# Patient Record
Sex: Female | Born: 1980 | Race: Black or African American | Hispanic: No | Smoking: Never smoker
Health system: Southern US, Community
[De-identification: ages and names within clinical notes are randomized; demographics above are authoritative.]

## PROBLEM LIST (undated history)

## (undated) DIAGNOSIS — E079 Disorder of thyroid, unspecified: Secondary | ICD-10-CM

## (undated) DIAGNOSIS — I10 Essential (primary) hypertension: Secondary | ICD-10-CM

## (undated) HISTORY — DX: Disorder of thyroid, unspecified: E07.9

## (undated) HISTORY — PX: CHOLECYSTECTOMY: SHX55

---

## 2010-05-30 ENCOUNTER — Emergency Department (HOSPITAL_BASED_OUTPATIENT_CLINIC_OR_DEPARTMENT_OTHER): Admission: EM | Admit: 2010-05-30 | Discharge: 2010-05-30 | Payer: Self-pay | Admitting: Emergency Medicine

## 2010-11-07 ENCOUNTER — Emergency Department (HOSPITAL_BASED_OUTPATIENT_CLINIC_OR_DEPARTMENT_OTHER)
Admission: EM | Admit: 2010-11-07 | Discharge: 2010-11-07 | Disposition: A | Discharge status: Home / Self Care | Payer: Self-pay | Attending: Emergency Medicine | Admitting: Emergency Medicine

## 2010-11-07 ENCOUNTER — Emergency Department (INDEPENDENT_AMBULATORY_CARE_PROVIDER_SITE_OTHER): Payer: Self-pay

## 2010-11-07 DIAGNOSIS — E119 Type 2 diabetes mellitus without complications: Secondary | ICD-10-CM | POA: Insufficient documentation

## 2010-11-07 DIAGNOSIS — R05 Cough: Secondary | ICD-10-CM

## 2010-11-07 DIAGNOSIS — R059 Cough, unspecified: Secondary | ICD-10-CM | POA: Insufficient documentation

## 2010-11-07 DIAGNOSIS — R509 Fever, unspecified: Secondary | ICD-10-CM

## 2010-11-07 DIAGNOSIS — J40 Bronchitis, not specified as acute or chronic: Secondary | ICD-10-CM | POA: Insufficient documentation

## 2010-11-07 DIAGNOSIS — R0989 Other specified symptoms and signs involving the circulatory and respiratory systems: Secondary | ICD-10-CM

## 2010-11-07 DIAGNOSIS — I10 Essential (primary) hypertension: Secondary | ICD-10-CM | POA: Insufficient documentation

## 2011-08-01 ENCOUNTER — Emergency Department (HOSPITAL_COMMUNITY): Payer: No Typology Code available for payment source

## 2011-08-01 ENCOUNTER — Encounter: Payer: Self-pay | Admitting: *Deleted

## 2011-08-01 ENCOUNTER — Emergency Department (HOSPITAL_COMMUNITY)
Admission: EM | Admit: 2011-08-01 | Discharge: 2011-08-02 | Disposition: A | Discharge status: Home / Self Care | Payer: No Typology Code available for payment source | Attending: Emergency Medicine | Admitting: Emergency Medicine

## 2011-08-01 DIAGNOSIS — S139XXA Sprain of joints and ligaments of unspecified parts of neck, initial encounter: Secondary | ICD-10-CM | POA: Insufficient documentation

## 2011-08-01 DIAGNOSIS — S161XXA Strain of muscle, fascia and tendon at neck level, initial encounter: Secondary | ICD-10-CM

## 2011-08-01 DIAGNOSIS — M542 Cervicalgia: Secondary | ICD-10-CM | POA: Insufficient documentation

## 2011-08-01 DIAGNOSIS — M25519 Pain in unspecified shoulder: Secondary | ICD-10-CM | POA: Insufficient documentation

## 2011-08-01 DIAGNOSIS — Z79899 Other long term (current) drug therapy: Secondary | ICD-10-CM | POA: Insufficient documentation

## 2011-08-01 DIAGNOSIS — Z9889 Other specified postprocedural states: Secondary | ICD-10-CM | POA: Insufficient documentation

## 2011-08-01 DIAGNOSIS — T148XXA Other injury of unspecified body region, initial encounter: Secondary | ICD-10-CM

## 2011-08-01 DIAGNOSIS — I10 Essential (primary) hypertension: Secondary | ICD-10-CM | POA: Insufficient documentation

## 2011-08-01 DIAGNOSIS — E119 Type 2 diabetes mellitus without complications: Secondary | ICD-10-CM | POA: Insufficient documentation

## 2011-08-01 HISTORY — DX: Essential (primary) hypertension: I10

## 2011-08-01 LAB — GLUCOSE, CAPILLARY: Glucose-Capillary: 373 mg/dL — ABNORMAL HIGH (ref 70–99)

## 2011-08-01 MED ORDER — DIAZEPAM 5 MG PO TABS
5.0000 mg | ORAL_TABLET | Freq: Once | ORAL | Status: AC
  Administered 2011-08-01: 5 mg via ORAL
  Filled 2011-08-01: qty 1

## 2011-08-01 MED ORDER — IBUPROFEN 800 MG PO TABS
800.0000 mg | ORAL_TABLET | Freq: Once | ORAL | Status: AC
  Administered 2011-08-01: 800 mg via ORAL
  Filled 2011-08-01: qty 1

## 2011-08-01 MED ORDER — HYDROCODONE-ACETAMINOPHEN 5-325 MG PO TABS
1.0000 | ORAL_TABLET | Freq: Once | ORAL | Status: AC
  Administered 2011-08-01: 1 via ORAL
  Filled 2011-08-01: qty 1
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
## 2011-08-02 MED ORDER — IBUPROFEN 400 MG PO TABS: 400.0000 mg | ORAL_TABLET | Freq: Four times a day (QID) | ORAL | Status: AC | PRN

## 2011-08-02 MED ORDER — HYDROCODONE-ACETAMINOPHEN 5-325 MG PO TABS: 1.0000 | ORAL_TABLET | ORAL | Status: AC | PRN

## 2011-08-02 MED ORDER — CYCLOBENZAPRINE HCL 10 MG PO TABS: 10.0000 mg | ORAL_TABLET | Freq: Three times a day (TID) | ORAL | Status: AC | PRN
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
## 2011-10-10 ENCOUNTER — Encounter (HOSPITAL_BASED_OUTPATIENT_CLINIC_OR_DEPARTMENT_OTHER): Payer: Self-pay | Admitting: *Deleted

## 2011-10-10 ENCOUNTER — Emergency Department (HOSPITAL_BASED_OUTPATIENT_CLINIC_OR_DEPARTMENT_OTHER)
Admission: EM | Admit: 2011-10-10 | Discharge: 2011-10-10 | Disposition: A | Discharge status: Home / Self Care | Payer: Self-pay | Attending: Emergency Medicine | Admitting: Emergency Medicine

## 2011-10-10 DIAGNOSIS — E119 Type 2 diabetes mellitus without complications: Secondary | ICD-10-CM | POA: Insufficient documentation

## 2011-10-10 DIAGNOSIS — I10 Essential (primary) hypertension: Secondary | ICD-10-CM | POA: Insufficient documentation

## 2011-10-10 DIAGNOSIS — R109 Unspecified abdominal pain: Secondary | ICD-10-CM | POA: Insufficient documentation

## 2011-10-10 LAB — URINE MICROSCOPIC-ADD ON

## 2011-10-10 LAB — COMPREHENSIVE METABOLIC PANEL
ALT: 17 U/L (ref 0–35)
AST: 15 U/L (ref 0–37)
Albumin: 3.8 g/dL (ref 3.5–5.2)
Alkaline Phosphatase: 78 U/L (ref 39–117)
BUN: 10 mg/dL (ref 6–23)
CO2: 26 mEq/L (ref 19–32)
Calcium: 10.3 mg/dL (ref 8.4–10.5)
Chloride: 98 mEq/L (ref 96–112)
Creatinine, Ser: 0.4 mg/dL — ABNORMAL LOW (ref 0.50–1.10)
GFR calc Af Amer: >90 mL/min (ref >90)
GFR calc non Af Amer: >90 mL/min (ref >90)
Glucose, Bld: 309 mg/dL — ABNORMAL HIGH (ref 70–99)
Potassium: 4 mEq/L (ref 3.5–5.1)
Sodium: 135 mEq/L (ref 135–145)
Total Bilirubin: 0.3 mg/dL (ref 0.3–1.2)
Total Protein: 7.8 g/dL (ref 6.0–8.3)

## 2011-10-10 LAB — DIFFERENTIAL
Basophils Absolute: 0 10*3/uL (ref 0.0–0.1)
Basophils Relative: 0 % (ref 0–1)
Eosinophils Absolute: 0.1 10*3/uL (ref 0.0–0.7)
Eosinophils Relative: 1 % (ref 0–5)
Lymphocytes Relative: 31 % (ref 12–46)
Lymphs Abs: 2.6 10*3/uL (ref 0.7–4.0)
Monocytes Absolute: 0.7 10*3/uL (ref 0.1–1.0)
Monocytes Relative: 8 % (ref 3–12)
Neutro Abs: 5.1 10*3/uL (ref 1.7–7.7)
Neutrophils Relative %: 60 % (ref 43–77)

## 2011-10-10 LAB — URINALYSIS, ROUTINE W REFLEX MICROSCOPIC
Bilirubin Urine: NEGATIVE
Glucose, UA: >1000 mg/dL — AB
Hgb urine dipstick: NEGATIVE
Ketones, ur: NEGATIVE mg/dL
Leukocytes, UA: NEGATIVE
Nitrite: NEGATIVE
Protein, ur: NEGATIVE mg/dL
Specific Gravity, Urine: 1.041 — ABNORMAL HIGH (ref 1.005–1.030)
Urobilinogen, UA: 0.2 mg/dL (ref 0.0–1.0)
pH: 5.5 (ref 5.0–8.0)

## 2011-10-10 LAB — CBC
HCT: 39.8 % (ref 36.0–46.0)
Hemoglobin: 14.2 g/dL (ref 12.0–15.0)
MCH: 28.6 pg (ref 26.0–34.0)
MCHC: 35.7 g/dL (ref 30.0–36.0)
MCV: 80.1 fL (ref 78.0–100.0)
Platelets: 235 10*3/uL (ref 150–400)
RBC: 4.97 MIL/uL (ref 3.87–5.11)
RDW: 12.4 % (ref 11.5–15.5)
WBC: 8.5 10*3/uL (ref 4.0–10.5)

## 2011-10-10 LAB — LIPASE, BLOOD: Lipase: 21 U/L (ref 11–59)

## 2011-10-10 LAB — PREGNANCY, URINE: Preg Test, Ur: NEGATIVE

## 2011-10-10 MED ORDER — HYDROMORPHONE HCL PF 1 MG/ML IJ SOLN
1.0000 mg | Freq: Once | INTRAMUSCULAR | Status: AC
  Administered 2011-10-10: 1 mg via INTRAVENOUS
  Filled 2011-10-10: qty 1

## 2011-10-10 MED ORDER — SODIUM CHLORIDE 0.9 % IV BOLUS (SEPSIS)
1000.0000 mL | Freq: Once | INTRAVENOUS | Status: AC
  Administered 2011-10-10: 1000 mL via INTRAVENOUS

## 2011-10-10 MED ORDER — ONDANSETRON HCL 4 MG/2ML IJ SOLN
4.0000 mg | Freq: Once | INTRAMUSCULAR | Status: AC
  Administered 2011-10-10: 4 mg via INTRAVENOUS
  Filled 2011-10-10: qty 2

## 2011-10-10 NOTE — ED Provider Notes (Signed)
History   Scribed for Hilario Quarry, MD, the patient was seen in [facility]. The chart was scribed by Gilman Schmidt. The patients care was started at 9:35 PM.   CSN: [id number]  Arrival date & time 10/10/11  2112   First MD Initiated Contact with Patient 10/10/11 2118      Chief Complaint  Patient presents with  . Abdominal Pain    (Consider location/radiation/quality/duration/timing/severity/associated sxs/prior treatment) HPI Rhonda Baldwin is a 31 y.o. female with a history of HTN and DM who presents to the Emergency Department complaining of sharp "severe" abdominal pain ~8 hours after eating lunch. Pt reports eating chicken nuggets and fries. States symptoms are worsening. Also notes nausea and vomiting (4x). Denies any diarrhea. Denies any previous similar symptoms. Denies any hx of Pancreatitis. Denies any exacerbating or alleviating factors. Pt states she is prescribed meds for DM but does not consistently take them. States she has not seen PCP is last year. There are no other associated symptoms and no other alleviating or aggravating factors.     Past Medical History  Diagnosis Date  . Hypertension   . Diabetes mellitus     Past Surgical History  Procedure Date  . Cholecystectomy   . Cesarean section     Family History  Problem Relation Age of Onset  . Hypertension Mother   . Hypertension Father     History  Substance Use Topics  . Smoking status: Never Smoker   . Smokeless tobacco: Not on file  . Alcohol Use: No    OB History    Grav Para Term Preterm Abortions TAB SAB Ect Mult Living                  Review of Systems  Gastrointestinal: Positive for nausea, vomiting and abdominal pain. Negative for diarrhea.  All other systems reviewed and are negative.    Allergies  Review of patient's allergies indicates no known allergies.  Home Medications   Current Outpatient Rx  Name Route Sig Dispense Refill  . LISINOPRIL 10 MG PO TABS Oral Take 10 mg  by mouth daily.      Marland Kitchen METFORMIN HCL 1000 MG PO TABS Oral Take 1,000 mg by mouth 2 (two) times daily with a meal.        BP 182/110  Pulse 113  Temp 97.9 F (36.6 C)  Resp 18  Ht 5\' 3"  (1.6 m)  Wt 300 lb (136.079 kg)  BMI 53.14 kg/m2  SpO2 100%  LMP 09/29/2011  Physical Exam  Constitutional: She appears well-developed and well-nourished.  HENT:  Head: Normocephalic and atraumatic.  Eyes: Conjunctivae are normal. Pupils are equal, round, and reactive to light.  Neck: Neck supple. No tracheal deviation present. No thyromegaly present.  Cardiovascular: Normal rate and regular rhythm.   No murmur heard. Pulmonary/Chest: Effort normal and breath sounds normal.  Abdominal: Soft. Bowel sounds are normal. She exhibits no distension. There is tenderness in the epigastric area.  Musculoskeletal: Normal range of motion. She exhibits no edema and no tenderness.  Neurological: She is alert. Coordination normal.  Skin: Skin is warm and dry. No rash noted.  Psychiatric: She has a normal mood and affect.    ED Course  Procedures (including critical care time)   DIAGNOSTIC STUDIES: Oxygen Saturation is 100% on room air, normal by my interpretation.    LABS Results for orders placed during the hospital encounter of 10/10/11  URINALYSIS, ROUTINE W REFLEX MICROSCOPIC      Component  Value Range   Color, Urine YELLOW  YELLOW    APPearance CLEAR  CLEAR    Specific Gravity, Urine 1.041 (*) 1.005 - 1.030    pH 5.5  5.0 - 8.0    Glucose, UA >1000 (*) NEGATIVE (mg/dL)   Hgb urine dipstick NEGATIVE  NEGATIVE    Bilirubin Urine NEGATIVE  NEGATIVE    Ketones, ur NEGATIVE  NEGATIVE (mg/dL)   Protein, ur NEGATIVE  NEGATIVE (mg/dL)   Urobilinogen, UA 0.2  0.0 - 1.0 (mg/dL)   Nitrite NEGATIVE  NEGATIVE    Leukocytes, UA NEGATIVE  NEGATIVE   PREGNANCY, URINE      Component Value Range   Preg Test, Ur NEGATIVE  NEGATIVE   CBC      Component Value Range   WBC 8.5  4.0 - 10.5 (K/uL)   RBC 4.97   3.87 - 5.11 (MIL/uL)   Hemoglobin 14.2  12.0 - 15.0 (g/dL)   HCT 16.1  09.6 - 04.5 (%)   MCV 80.1  78.0 - 100.0 (fL)   MCH 28.6  26.0 - 34.0 (pg)   MCHC 35.7  30.0 - 36.0 (g/dL)   RDW 40.9  81.1 - 91.4 (%)   Platelets 235  150 - 400 (K/uL)  COMPREHENSIVE METABOLIC PANEL      Component Value Range   Sodium 135  135 - 145 (mEq/L)   Potassium 4.0  3.5 - 5.1 (mEq/L)   Chloride 98  96 - 112 (mEq/L)   CO2 26  19 - 32 (mEq/L)   Glucose, Bld 309 (*) 70 - 99 (mg/dL)   BUN 10  6 - 23 (mg/dL)   Creatinine, Ser 7.82 (*) 0.50 - 1.10 (mg/dL)   Calcium 95.6  8.4 - 10.5 (mg/dL)   Total Protein 7.8  6.0 - 8.3 (g/dL)   Albumin 3.8  3.5 - 5.2 (g/dL)   AST 15  0 - 37 (U/L)   ALT 17  0 - 35 (U/L)   Alkaline Phosphatase 78  39 - 117 (U/L)   Total Bilirubin 0.3  0.3 - 1.2 (mg/dL)   GFR calc non Af Amer >90  >90 (mL/min)   GFR calc Af Amer >90  >90 (mL/min)  DIFFERENTIAL      Component Value Range   Neutrophils Relative 60  43 - 77 (%)   Neutro Abs 5.1  1.7 - 7.7 (K/uL)   Lymphocytes Relative 31  12 - 46 (%)   Lymphs Abs 2.6  0.7 - 4.0 (K/uL)   Monocytes Relative 8  3 - 12 (%)   Monocytes Absolute 0.7  0.1 - 1.0 (K/uL)   Eosinophils Relative 1  0 - 5 (%)   Eosinophils Absolute 0.1  0.0 - 0.7 (K/uL)   Basophils Relative 0  0 - 1 (%)   Basophils Absolute 0.0  0.0 - 0.1 (K/uL)  LIPASE, BLOOD      Component Value Range   Lipase 21  11 - 59 (U/L)  URINE MICROSCOPIC-ADD ON      Component Value Range   Squamous Epithelial / LPF RARE  RARE    WBC, UA 0-2  <3 (WBC/hpf)   RBC / HPF 0-2  <3 (RBC/hpf)   Bacteria, UA FEW (*) RARE      COORDINATION OF CARE: 9:35pm:  - Patient evaluated by ED physician, Zofran, Dilaudid, IV, Diff, Lipase, UA, Pregnancy urine, CBC, CMP ordered     MDM    Patient's tenderness has resolved patient had blood sugar of 309  but has received 1 L of normal saline. The patient has had some decrease in her blood pressure but has not been taking her blood pressure medicine  regularly or hypoglycemic agents as prescribed. She is advised to recheck with her primary care doctor tomorrow for recheck of her blood sugar and her blood pressure       Hilario Quarry, MD 10/12/11 1408

## 2011-10-10 NOTE — Discharge Instructions (Signed)
Recheck with your doctor tomorrow to recheck your blood sugar and blood pressure.  Return if your abdominal pain returns.  Take only clear liquids for the next 12 hours.    Abdominal Pain Abdominal pain can be caused by many things. Your caregiver decides the seriousness of your pain by an examination and possibly blood tests and X-rays. Many cases can be observed and treated at home. Most abdominal pain is not caused by a disease and will probably improve without treatment. However, in many cases, more time must pass before a clear cause of the pain can be found. Before that point, it may not be known if you need more testing, or if hospitalization or surgery is needed. HOME CARE INSTRUCTIONS   Do not take laxatives unless directed by your caregiver.   Take pain medicine only as directed by your caregiver.   Only take over-the-counter or prescription medicines for pain, discomfort, or fever as directed by your caregiver.   Try a clear liquid diet (broth, tea, or water) for as long as directed by your caregiver. Slowly move to a bland diet as tolerated.  SEEK IMMEDIATE MEDICAL CARE IF:   The pain does not go away.   You have a fever.   You keep throwing up (vomiting).   The pain is felt only in portions of the abdomen. Pain in the right side could possibly be appendicitis. In an adult, pain in the left lower portion of the abdomen could be colitis or diverticulitis.   You pass bloody or black tarry stools.  MAKE SURE YOU:   Understand these instructions.   Will watch your condition.   Will get help right away if you are not doing well or get worse.  Document Released: 05/11/2005 Document Revised: 04/13/2011 Document Reviewed: 03/19/2008 [facility] Patient Information 2012 [place], [place].

## 2011-10-10 NOTE — ED Notes (Signed)
Pt c/o "severe" abd pain x 8 hrs after eating lunch , n/v also

## 2012-03-17 ENCOUNTER — Emergency Department (HOSPITAL_BASED_OUTPATIENT_CLINIC_OR_DEPARTMENT_OTHER)
Admission: EM | Admit: 2012-03-17 | Discharge: 2012-03-17 | Disposition: A | Discharge status: Home / Self Care | Payer: Self-pay | Attending: Emergency Medicine | Admitting: Emergency Medicine

## 2012-03-17 ENCOUNTER — Emergency Department (HOSPITAL_BASED_OUTPATIENT_CLINIC_OR_DEPARTMENT_OTHER): Payer: Self-pay

## 2012-03-17 ENCOUNTER — Encounter (HOSPITAL_BASED_OUTPATIENT_CLINIC_OR_DEPARTMENT_OTHER): Payer: Self-pay | Admitting: *Deleted

## 2012-03-17 DIAGNOSIS — I10 Essential (primary) hypertension: Secondary | ICD-10-CM | POA: Insufficient documentation

## 2012-03-17 DIAGNOSIS — M79609 Pain in unspecified limb: Secondary | ICD-10-CM | POA: Insufficient documentation

## 2012-03-17 DIAGNOSIS — E119 Type 2 diabetes mellitus without complications: Secondary | ICD-10-CM | POA: Insufficient documentation

## 2012-03-17 DIAGNOSIS — M79676 Pain in unspecified toe(s): Secondary | ICD-10-CM

## 2012-03-17 MED ORDER — HYDROCODONE-ACETAMINOPHEN 5-500 MG PO TABS: 1.0000 | ORAL_TABLET | Freq: Four times a day (QID) | ORAL | Status: AC | PRN

## 2012-03-17 NOTE — ED Notes (Signed)
C/O pain to left second toe. No known injury. Worse today.

## 2012-03-17 NOTE — ED Provider Notes (Signed)
History     CSN: [id number]  Arrival date & time 03/17/12  1754   First MD Initiated Contact with Patient 03/17/12 1757      Chief Complaint  Patient presents with  . Toe Pain    (Consider location/radiation/quality/duration/timing/severity/associated sxs/prior treatment) HPI Comments: Pt c/o pain without injury  Patient is a 31 y.o. female presenting with toe pain. The history is provided by the patient. No language interpreter was used.  Toe Pain This is a new problem. The current episode started in the past 7 days. The problem occurs constantly. The problem has been gradually worsening. The symptoms are aggravated by bending. She has tried nothing for the symptoms.    Past Medical History  Diagnosis Date  . Hypertension   . Diabetes mellitus     Past Surgical History  Procedure Date  . Cholecystectomy   . Cesarean section     Family History  Problem Relation Age of Onset  . Hypertension Mother   . Hypertension Father     History  Substance Use Topics  . Smoking status: Never Smoker   . Smokeless tobacco: Not on file  . Alcohol Use: No    OB History    Grav Para Term Preterm Abortions TAB SAB Ect Mult Living                  Review of Systems  Constitutional: Negative.   Respiratory: Negative.   Cardiovascular: Negative.     Allergies  Orange  Home Medications   Current Outpatient Rx  Name Route Sig Dispense Refill  . ACETAMINOPHEN 500 MG PO TABS Oral Take 1,500 mg by mouth every 6 (six) hours as needed. For headache.    Marland Kitchen HYDROCODONE-ACETAMINOPHEN 5-500 MG PO TABS Oral Take 1-2 tablets by mouth every 6 (six) hours as needed for pain. 10 tablet 0    BP 154/80  Pulse 122  Temp 97.9 F (36.6 C) (Oral)  Resp 20  Ht 5\' 3"  (1.6 m)  Wt 280 lb (127.007 kg)  BMI 49.60 kg/m2  SpO2 98%  LMP 02/25/2012  Physical Exam  Constitutional: She is oriented to person, place, and time.  Musculoskeletal:       No gross deformity swelling or redness to  the left foot  Neurological: She is alert and oriented to person, place, and time.    ED Course  Procedures (including critical care time)  Labs Reviewed - No data to display Dg Foot Complete Left  03/17/2012  *RADIOLOGY REPORT*  Clinical Data: toe pain  LEFT FOOT - COMPLETE 3+ VIEW  Comparison: none  Findings: There is no evidence of fracture or dislocation. Plantar and posterior calcaneal heel spurs noted.  Soft tissues are unremarkable.  IMPRESSION: Normal exam.  Original Report Authenticated By: Rosealee Albee, M.D.     1. Toe pain       MDM  Pt is okay follow up with ortho:will treat symptomatically with vicodin        Teressa Lower, NP 03/17/12 2002

## 2012-03-17 NOTE — ED Provider Notes (Signed)
History/physical exam/procedure(s) were performed by non-physician practitioner and as supervising physician I was immediately available for consultation/collaboration. I have reviewed all notes and am in agreement with care and plan.   Maisen Klingler S Glendell Schlottman, MD 03/17/12 2329 

## 2012-03-17 NOTE — ED Notes (Signed)
D/c with family to drive- Rx x 1 given for hydrocodone

## 2012-06-10 ENCOUNTER — Emergency Department (HOSPITAL_BASED_OUTPATIENT_CLINIC_OR_DEPARTMENT_OTHER)
Admission: EM | Admit: 2012-06-10 | Discharge: 2012-06-10 | Disposition: A | Discharge status: Home / Self Care | Payer: Self-pay | Attending: Emergency Medicine | Admitting: Emergency Medicine

## 2012-06-10 ENCOUNTER — Encounter (HOSPITAL_BASED_OUTPATIENT_CLINIC_OR_DEPARTMENT_OTHER): Payer: Self-pay | Admitting: *Deleted

## 2012-06-10 DIAGNOSIS — E119 Type 2 diabetes mellitus without complications: Secondary | ICD-10-CM | POA: Insufficient documentation

## 2012-06-10 DIAGNOSIS — R7309 Other abnormal glucose: Secondary | ICD-10-CM | POA: Insufficient documentation

## 2012-06-10 DIAGNOSIS — R5381 Other malaise: Secondary | ICD-10-CM | POA: Insufficient documentation

## 2012-06-10 DIAGNOSIS — R739 Hyperglycemia, unspecified: Secondary | ICD-10-CM

## 2012-06-10 DIAGNOSIS — R634 Abnormal weight loss: Secondary | ICD-10-CM | POA: Insufficient documentation

## 2012-06-10 DIAGNOSIS — R51 Headache: Secondary | ICD-10-CM | POA: Insufficient documentation

## 2012-06-10 DIAGNOSIS — I10 Essential (primary) hypertension: Secondary | ICD-10-CM | POA: Insufficient documentation

## 2012-06-10 LAB — COMPREHENSIVE METABOLIC PANEL
ALT: 24 U/L (ref 0–35)
AST: 16 U/L (ref 0–37)
Albumin: 3.4 g/dL — ABNORMAL LOW (ref 3.5–5.2)
Alkaline Phosphatase: 83 U/L (ref 39–117)
BUN: 13 mg/dL (ref 6–23)
CO2: 25 mEq/L (ref 19–32)
Calcium: 9.9 mg/dL (ref 8.4–10.5)
Chloride: 99 mEq/L (ref 96–112)
Creatinine, Ser: 0.3 mg/dL — ABNORMAL LOW (ref 0.50–1.10)
GFR calc Af Amer: >90 mL/min (ref >90)
GFR calc non Af Amer: >90 mL/min (ref >90)
Glucose, Bld: 302 mg/dL — ABNORMAL HIGH (ref 70–99)
Potassium: 3.9 mEq/L (ref 3.5–5.1)
Sodium: 136 mEq/L (ref 135–145)
Total Bilirubin: 0.4 mg/dL (ref 0.3–1.2)
Total Protein: 7.3 g/dL (ref 6.0–8.3)

## 2012-06-10 LAB — CBC WITH DIFFERENTIAL/PLATELET
Basophils Absolute: 0 10*3/uL (ref 0.0–0.1)
Basophils Relative: 0 % (ref 0–1)
Eosinophils Absolute: 0 10*3/uL (ref 0.0–0.7)
Eosinophils Relative: 1 % (ref 0–5)
HCT: 37 % (ref 36.0–46.0)
Hemoglobin: 12.5 g/dL (ref 12.0–15.0)
Lymphocytes Relative: 30 % (ref 12–46)
Lymphs Abs: 1.8 10*3/uL (ref 0.7–4.0)
MCH: 26.3 pg (ref 26.0–34.0)
MCHC: 33.8 g/dL (ref 30.0–36.0)
MCV: 77.9 fL — ABNORMAL LOW (ref 78.0–100.0)
Monocytes Absolute: 0.6 10*3/uL (ref 0.1–1.0)
Monocytes Relative: 10 % (ref 3–12)
Neutro Abs: 3.6 10*3/uL (ref 1.7–7.7)
Neutrophils Relative %: 60 % (ref 43–77)
Platelets: 177 10*3/uL (ref 150–400)
RBC: 4.75 MIL/uL (ref 3.87–5.11)
RDW: 12.2 % (ref 11.5–15.5)
WBC: 6.1 10*3/uL (ref 4.0–10.5)

## 2012-06-10 MED ORDER — ENALAPRIL MALEATE 5 MG PO TABS: 5.0000 mg | ORAL_TABLET | Freq: Every day | ORAL | Status: DC

## 2012-06-10 MED ORDER — METFORMIN HCL 500 MG PO TABS: 500.0000 mg | ORAL_TABLET | Freq: Two times a day (BID) | ORAL | Status: DC

## 2012-06-10 NOTE — ED Provider Notes (Signed)
History     CSN: [id number]  Arrival date & time 06/10/12  1038   First MD Initiated Contact with Patient 06/10/12 1133      Chief Complaint  Patient presents with  . Hypertension    (Consider location/radiation/quality/duration/timing/severity/associated sxs/prior treatment) HPI Comments: The patient presents here with multiple complaints.  Her blood pressure has been running high for the past few days.  She has a history of htn and was previously on an ace inhibitor which she is no longer taking due to not having insurance.  She has been having headaches, fatigue, and occasional nosebleeds.    She also reports to me that she has lost 50 lbs in the past two months without trying to lose weight.  She reports no new activities or exercises, but does report eating less due to having less apetite.    No fevers or chills.  No aggravating or alleviating factors.    The history is provided by the patient.    Past Medical History  Diagnosis Date  . Hypertension   . Diabetes mellitus     Past Surgical History  Procedure Date  . Cholecystectomy   . Cesarean section     Family History  Problem Relation Age of Onset  . Hypertension Mother   . Hypertension Father     History  Substance Use Topics  . Smoking status: Never Smoker   . Smokeless tobacco: Not on file  . Alcohol Use: No    OB History    Grav Para Term Preterm Abortions TAB SAB Ect Mult Living                  Review of Systems  Constitutional: Positive for activity change, appetite change, fatigue and unexpected weight change. Negative for fever, chills and diaphoresis.  HENT:       Nose bleed  Neurological: Positive for headaches.  All other systems reviewed and are negative.    Allergies  Orange  Home Medications   Current Outpatient Rx  Name Route Sig Dispense Refill  . ACETAMINOPHEN 500 MG PO TABS Oral Take 1,500 mg by mouth every 6 (six) hours as needed. For headache.      BP 169/100   Pulse 97  Temp 97.5 F (36.4 C)  Resp 20  SpO2 98%  Physical Exam  Nursing note and vitals reviewed. Constitutional: She is oriented to person, place, and time. She appears well-developed and well-nourished. No distress.  HENT:  Head: Normocephalic and atraumatic.  Nose: Nose normal.  Mouth/Throat: Oropharynx is clear and moist.       Nares clear.  No active or old bleeding.  Neck: Normal range of motion. Neck supple.  Cardiovascular: Normal rate and regular rhythm.  Exam reveals no gallop and no friction rub.   No murmur heard. Pulmonary/Chest: Effort normal and breath sounds normal. No respiratory distress. She has no wheezes.  Abdominal: Soft. Bowel sounds are normal. She exhibits no distension. There is no tenderness.  Musculoskeletal: Normal range of motion.  Neurological: She is alert and oriented to person, place, and time.  Skin: Skin is warm and dry. She is not diaphoretic.    ED Course  Procedures (including critical care time)   Labs Reviewed  CBC WITH DIFFERENTIAL  COMPREHENSIVE METABOLIC PANEL  TSH   No results found.   No diagnosis found.    MDM  The patient presents with multiple complaints, none seem emergent and workup is negative.  It seems as though her biggest  problem is the lack of primary care.  She will be prescribed enalapril, metformin to get her back on her regular meds.  The husband is due to get health insurance in the near future and will obtain a pcp then.        Geoffery Lyons, MD 06/10/12 [phone]

## 2012-06-10 NOTE — ED Notes (Signed)
Pt presents to ED today with increased BP at home and at work on Friday.  Pt has hx of same but has never been prescribed RX.  Pt does not have PMD

## 2012-06-10 NOTE — ED Notes (Signed)
Pt states BP elevated at work Friday.  Pt further reports + nosebleed at home today but has since resolved on its own.  Pt has no signs of bleeding around nares.

## 2012-06-11 LAB — TSH: TSH: <0.008 u[IU]/mL — ABNORMAL LOW (ref 0.350–4.500)

## 2012-08-07 ENCOUNTER — Encounter (HOSPITAL_BASED_OUTPATIENT_CLINIC_OR_DEPARTMENT_OTHER): Payer: Self-pay | Admitting: *Deleted

## 2012-08-07 ENCOUNTER — Emergency Department (HOSPITAL_BASED_OUTPATIENT_CLINIC_OR_DEPARTMENT_OTHER)
Admission: EM | Admit: 2012-08-07 | Discharge: 2012-08-07 | Disposition: A | Discharge status: Home / Self Care | Payer: Self-pay | Attending: Emergency Medicine | Admitting: Emergency Medicine

## 2012-08-07 DIAGNOSIS — R0982 Postnasal drip: Secondary | ICD-10-CM | POA: Insufficient documentation

## 2012-08-07 DIAGNOSIS — H66009 Acute suppurative otitis media without spontaneous rupture of ear drum, unspecified ear: Secondary | ICD-10-CM | POA: Insufficient documentation

## 2012-08-07 DIAGNOSIS — Z79899 Other long term (current) drug therapy: Secondary | ICD-10-CM | POA: Insufficient documentation

## 2012-08-07 DIAGNOSIS — E119 Type 2 diabetes mellitus without complications: Secondary | ICD-10-CM | POA: Insufficient documentation

## 2012-08-07 DIAGNOSIS — H6691 Otitis media, unspecified, right ear: Secondary | ICD-10-CM

## 2012-08-07 DIAGNOSIS — Z8249 Family history of ischemic heart disease and other diseases of the circulatory system: Secondary | ICD-10-CM | POA: Insufficient documentation

## 2012-08-07 DIAGNOSIS — J069 Acute upper respiratory infection, unspecified: Secondary | ICD-10-CM | POA: Insufficient documentation

## 2012-08-07 DIAGNOSIS — I10 Essential (primary) hypertension: Secondary | ICD-10-CM | POA: Insufficient documentation

## 2012-08-07 LAB — RAPID STREP SCREEN (MED CTR MEBANE ONLY): Streptococcus, Group A Screen (Direct): NEGATIVE

## 2012-08-07 MED ORDER — AZITHROMYCIN 250 MG PO TABS: 250.0000 mg | ORAL_TABLET | Freq: Every day | ORAL | Status: DC

## 2012-08-07 MED ORDER — ENALAPRIL MALEATE 10 MG PO TABS: 5.0000 mg | ORAL_TABLET | Freq: Every day | ORAL | Status: DC

## 2012-08-07 NOTE — ED Provider Notes (Signed)
History     CSN: [id number]  Arrival date & time 08/07/12  1478   First MD Initiated Contact with Patient 08/07/12 [phone]      Chief Complaint  Patient presents with  . Sore Throat    (Consider location/radiation/quality/duration/timing/severity/associated sxs/prior treatment) HPI Comments: Patient presents with a sore throat and earache that started yesterday. She's had some running nose and postnasal drip for several days. She denies any known fevers. She denies any nausea vomiting. She denies any cough or chest congestion. She's had constant throbbing pain to her throat and it years since yesterday. She's been taking Tylenol without relief. She also requests a prescription for her Vasotec given that she is almost out of it and did not have a primary care physician.  Patient is a 31 y.o. female presenting with pharyngitis.  Sore Throat Pertinent negatives include no chest pain, no abdominal pain, no headaches and no shortness of breath.    Past Medical History  Diagnosis Date  . Hypertension   . Diabetes mellitus     Past Surgical History  Procedure Date  . Cholecystectomy   . Cesarean section     Family History  Problem Relation Age of Onset  . Hypertension Mother   . Hypertension Father     History  Substance Use Topics  . Smoking status: Never Smoker   . Smokeless tobacco: Not on file  . Alcohol Use: No    OB History    Grav Para Term Preterm Abortions TAB SAB Ect Mult Living                  Review of Systems  Constitutional: Negative for fever, chills, diaphoresis and fatigue.  HENT: Positive for ear pain, congestion, sore throat, rhinorrhea and postnasal drip. Negative for sneezing and trouble swallowing.   Eyes: Negative.   Respiratory: Negative for cough, chest tightness and shortness of breath.   Cardiovascular: Negative for chest pain and leg swelling.  Gastrointestinal: Negative for nausea, vomiting, abdominal pain, diarrhea and blood in stool.   Genitourinary: Negative for frequency, hematuria, flank pain and difficulty urinating.  Musculoskeletal: Negative for back pain and arthralgias.  Skin: Negative for rash.  Neurological: Negative for dizziness, speech difficulty, weakness, numbness and headaches.    Allergies  Orange  Home Medications   Current Outpatient Rx  Name  Route  Sig  Dispense  Refill  . ACETAMINOPHEN 500 MG PO TABS   Oral   Take 1,500 mg by mouth every 6 (six) hours as needed. For headache.         . AZITHROMYCIN 250 MG PO TABS   Oral   Take 1 tablet (250 mg total) by mouth daily. Take first 2 tablets together, then 1 every day until finished.   6 tablet   0   . ENALAPRIL MALEATE 10 MG PO TABS   Oral   Take 0.5 tablets (5 mg total) by mouth daily.   30 tablet   1   . ENALAPRIL MALEATE 5 MG PO TABS   Oral   Take 1 tablet (5 mg total) by mouth daily.   30 tablet   1   . METFORMIN HCL 500 MG PO TABS   Oral   Take 1 tablet (500 mg total) by mouth 2 (two) times daily with a meal.   60 tablet   1     BP 179/90  Pulse 97  Temp 98.3 F (36.8 C) (Oral)  Resp 20  Ht 5\' 3"  (1.6  m)  Wt 220 lb (99.791 kg)  BMI 38.97 kg/m2  SpO2 99%  LMP 07/28/2012  Physical Exam  Constitutional: She is oriented to person, place, and time. She appears well-developed and well-nourished.  HENT:  Head: Normocephalic and atraumatic.  Left Ear: External ear normal.  Nose: Nose normal.  Mouth/Throat: Oropharynx is clear and moist.       Redness to the right TM with cloudy fluid behind the TM and bulging of the TM  Eyes: Pupils are equal, round, and reactive to light.  Neck: Normal range of motion. Neck supple.  Cardiovascular: Normal rate, regular rhythm and normal heart sounds.   Pulmonary/Chest: Effort normal and breath sounds normal. No respiratory distress. She has no wheezes. She has no rales. She exhibits no tenderness.  Abdominal: Soft. Bowel sounds are normal. There is no tenderness. There is no  rebound and no guarding.  Musculoskeletal: Normal range of motion. She exhibits no edema.  Lymphadenopathy:    She has no cervical adenopathy.  Neurological: She is alert and oriented to person, place, and time.  Skin: Skin is warm and dry. No rash noted.  Psychiatric: She has a normal mood and affect.    ED Course  Procedures (including critical care time)   No results found.   1. Right otitis media   2. URI (upper respiratory infection)       MDM  Patient given prescription for Zithromax. Was advised to use over-the-counter cold medicines as well. I did refill her prescription for Vasotec and stressed the importance of followup with a primary care physician. I did give her a resource guide for followup.        Rolan Bucco, MD 08/07/12 [phone]

## 2012-08-07 NOTE — ED Notes (Signed)
Pt amb to room 12 with quick steady gait reports sore throat x yesterday along with bilateral ear pain.

## 2012-08-16 ENCOUNTER — Inpatient Hospital Stay (HOSPITAL_BASED_OUTPATIENT_CLINIC_OR_DEPARTMENT_OTHER)
Admission: EM | Admit: 2012-08-16 | Discharge: 2012-08-19 | DRG: 871 | Disposition: A | Discharge status: Home / Self Care | Payer: Medicaid Other | Attending: Internal Medicine | Admitting: Internal Medicine

## 2012-08-16 ENCOUNTER — Encounter (HOSPITAL_BASED_OUTPATIENT_CLINIC_OR_DEPARTMENT_OTHER): Payer: Self-pay | Admitting: *Deleted

## 2012-08-16 DIAGNOSIS — K2970 Gastritis, unspecified, without bleeding: Secondary | ICD-10-CM | POA: Diagnosis present

## 2012-08-16 DIAGNOSIS — R112 Nausea with vomiting, unspecified: Secondary | ICD-10-CM

## 2012-08-16 DIAGNOSIS — E0590 Thyrotoxicosis, unspecified without thyrotoxic crisis or storm: Secondary | ICD-10-CM | POA: Diagnosis present

## 2012-08-16 DIAGNOSIS — Z79899 Other long term (current) drug therapy: Secondary | ICD-10-CM

## 2012-08-16 DIAGNOSIS — A419 Sepsis, unspecified organism: Principal | ICD-10-CM | POA: Diagnosis present

## 2012-08-16 DIAGNOSIS — A088 Other specified intestinal infections: Secondary | ICD-10-CM | POA: Diagnosis present

## 2012-08-16 DIAGNOSIS — J189 Pneumonia, unspecified organism: Secondary | ICD-10-CM | POA: Diagnosis present

## 2012-08-16 DIAGNOSIS — I10 Essential (primary) hypertension: Secondary | ICD-10-CM | POA: Diagnosis present

## 2012-08-16 DIAGNOSIS — Z23 Encounter for immunization: Secondary | ICD-10-CM

## 2012-08-16 DIAGNOSIS — R6510 Systemic inflammatory response syndrome (SIRS) of non-infectious origin without acute organ dysfunction: Secondary | ICD-10-CM

## 2012-08-16 DIAGNOSIS — E119 Type 2 diabetes mellitus without complications: Secondary | ICD-10-CM | POA: Diagnosis present

## 2012-08-16 DIAGNOSIS — I498 Other specified cardiac arrhythmias: Secondary | ICD-10-CM | POA: Diagnosis present

## 2012-08-16 NOTE — ED Notes (Signed)
Vomiting, diarrhea and cough since this afternoon.

## 2012-08-17 ENCOUNTER — Emergency Department (HOSPITAL_BASED_OUTPATIENT_CLINIC_OR_DEPARTMENT_OTHER): Payer: Medicaid Other

## 2012-08-17 ENCOUNTER — Encounter (HOSPITAL_BASED_OUTPATIENT_CLINIC_OR_DEPARTMENT_OTHER): Payer: Self-pay | Admitting: Emergency Medicine

## 2012-08-17 DIAGNOSIS — E119 Type 2 diabetes mellitus without complications: Secondary | ICD-10-CM

## 2012-08-17 DIAGNOSIS — J189 Pneumonia, unspecified organism: Secondary | ICD-10-CM

## 2012-08-17 DIAGNOSIS — I10 Essential (primary) hypertension: Secondary | ICD-10-CM | POA: Diagnosis present

## 2012-08-17 DIAGNOSIS — K2970 Gastritis, unspecified, without bleeding: Secondary | ICD-10-CM

## 2012-08-17 DIAGNOSIS — R112 Nausea with vomiting, unspecified: Secondary | ICD-10-CM | POA: Diagnosis present

## 2012-08-17 DIAGNOSIS — R197 Diarrhea, unspecified: Secondary | ICD-10-CM

## 2012-08-17 LAB — CBC WITH DIFFERENTIAL/PLATELET
Basophils Absolute: 0 10*3/uL (ref 0.0–0.1)
Basophils Relative: 0 % (ref 0–1)
Eosinophils Absolute: 0.1 10*3/uL (ref 0.0–0.7)
Eosinophils Relative: 1 % (ref 0–5)
HCT: 37.9 % (ref 36.0–46.0)
Hemoglobin: 13 g/dL (ref 12.0–15.0)
Lymphocytes Relative: 9 % — ABNORMAL LOW (ref 12–46)
Lymphs Abs: 0.9 10*3/uL (ref 0.7–4.0)
MCH: 26.9 pg (ref 26.0–34.0)
MCHC: 34.3 g/dL (ref 30.0–36.0)
MCV: 78.5 fL (ref 78.0–100.0)
Monocytes Absolute: 0.8 10*3/uL (ref 0.1–1.0)
Monocytes Relative: 8 % (ref 3–12)
Neutro Abs: 7.5 10*3/uL (ref 1.7–7.7)
Neutrophils Relative %: 82 % — ABNORMAL HIGH (ref 43–77)
Platelets: 169 10*3/uL (ref 150–400)
RBC: 4.83 MIL/uL (ref 3.87–5.11)
RDW: 12.6 % (ref 11.5–15.5)
WBC: 9.2 10*3/uL (ref 4.0–10.5)

## 2012-08-17 LAB — CREATININE, SERUM
Creatinine, Ser: 0.3 mg/dL — ABNORMAL LOW (ref 0.50–1.10)
GFR calc Af Amer: >90 mL/min (ref >90)
GFR calc non Af Amer: >90 mL/min (ref >90)

## 2012-08-17 LAB — GLUCOSE, CAPILLARY
Glucose-Capillary: 191 mg/dL — ABNORMAL HIGH (ref 70–99)
Glucose-Capillary: 151 mg/dL — ABNORMAL HIGH (ref 70–99)
Glucose-Capillary: 137 mg/dL — ABNORMAL HIGH (ref 70–99)

## 2012-08-17 LAB — URINE MICROSCOPIC-ADD ON

## 2012-08-17 LAB — URINALYSIS, ROUTINE W REFLEX MICROSCOPIC
Bilirubin Urine: NEGATIVE
Glucose, UA: >1000 mg/dL — AB
Ketones, ur: 15 mg/dL — AB
Leukocytes, UA: NEGATIVE
Nitrite: NEGATIVE
Protein, ur: >300 mg/dL — AB
Specific Gravity, Urine: 1.042 — ABNORMAL HIGH (ref 1.005–1.030)
Urobilinogen, UA: 0.2 mg/dL (ref 0.0–1.0)
pH: 5 (ref 5.0–8.0)

## 2012-08-17 LAB — COMPREHENSIVE METABOLIC PANEL
ALT: 24 U/L (ref 0–35)
AST: 28 U/L (ref 0–37)
Albumin: 3.4 g/dL — ABNORMAL LOW (ref 3.5–5.2)
Alkaline Phosphatase: 106 U/L (ref 39–117)
BUN: 16 mg/dL (ref 6–23)
CO2: 21 mEq/L (ref 19–32)
Calcium: 9.7 mg/dL (ref 8.4–10.5)
Chloride: 101 mEq/L (ref 96–112)
Creatinine, Ser: 0.3 mg/dL — ABNORMAL LOW (ref 0.50–1.10)
GFR calc Af Amer: >90 mL/min (ref >90)
GFR calc non Af Amer: >90 mL/min (ref >90)
Glucose, Bld: 252 mg/dL — ABNORMAL HIGH (ref 70–99)
Potassium: 4.2 mEq/L (ref 3.5–5.1)
Sodium: 137 mEq/L (ref 135–145)
Total Bilirubin: 0.8 mg/dL (ref 0.3–1.2)
Total Protein: 7.2 g/dL (ref 6.0–8.3)

## 2012-08-17 LAB — INFLUENZA PANEL BY PCR (TYPE A & B)
H1N1 flu by pcr: NOT DETECTED
Influenza A By PCR: NEGATIVE
Influenza B By PCR: NEGATIVE

## 2012-08-17 LAB — CBC
HCT: 34.7 % — ABNORMAL LOW (ref 36.0–46.0)
Hemoglobin: 11.6 g/dL — ABNORMAL LOW (ref 12.0–15.0)
MCH: 26.3 pg (ref 26.0–34.0)
MCHC: 33.4 g/dL (ref 30.0–36.0)
MCV: 78.7 fL (ref 78.0–100.0)
Platelets: 148 10*3/uL — ABNORMAL LOW (ref 150–400)
RBC: 4.41 MIL/uL (ref 3.87–5.11)
RDW: 12.9 % (ref 11.5–15.5)
WBC: 7.3 10*3/uL (ref 4.0–10.5)

## 2012-08-17 LAB — HIV ANTIBODY (ROUTINE TESTING W REFLEX): HIV: NONREACTIVE

## 2012-08-17 LAB — PREGNANCY, URINE: Preg Test, Ur: NEGATIVE

## 2012-08-17 LAB — STREP PNEUMONIAE URINARY ANTIGEN: Strep Pneumo Urinary Antigen: NEGATIVE

## 2012-08-17 LAB — LIPASE, BLOOD: Lipase: 14 U/L (ref 11–59)

## 2012-08-17 MED ORDER — DEXTROSE 5 % IV SOLN
1.0000 g | Freq: Once | INTRAVENOUS | Status: AC
  Administered 2012-08-17: 1 g via INTRAVENOUS
  Filled 2012-08-17: qty 10

## 2012-08-17 MED ORDER — DEXTROSE 5 % IV SOLN
500.0000 mg | Freq: Once | INTRAVENOUS | Status: AC
  Administered 2012-08-17: 500 mg via INTRAVENOUS
  Filled 2012-08-17: qty 500

## 2012-08-17 MED ORDER — INSULIN ASPART 100 UNIT/ML ~~LOC~~ SOLN
0.0000 [IU] | Freq: Three times a day (TID) | SUBCUTANEOUS | Status: DC
  Administered 2012-08-17: 3 [IU] via SUBCUTANEOUS
  Administered 2012-08-17 – 2012-08-18 (×4): 2 [IU] via SUBCUTANEOUS
  Administered 2012-08-19: 3 [IU] via SUBCUTANEOUS
  Administered 2012-08-19: 1 [IU] via SUBCUTANEOUS

## 2012-08-17 MED ORDER — OMEPRAZOLE 20 MG PO CPDR: 20.0000 mg | DELAYED_RELEASE_CAPSULE | Freq: Every day | ORAL | Status: DC

## 2012-08-17 MED ORDER — SODIUM CHLORIDE 0.9 % IV BOLUS (SEPSIS)
1000.0000 mL | Freq: Once | INTRAVENOUS | Status: AC
  Administered 2012-08-17: 1000 mL via INTRAVENOUS

## 2012-08-17 MED ORDER — KETOROLAC TROMETHAMINE 30 MG/ML IJ SOLN
30.0000 mg | Freq: Once | INTRAMUSCULAR | Status: AC
  Administered 2012-08-17: 30 mg via INTRAVENOUS
  Filled 2012-08-17: qty 1

## 2012-08-17 MED ORDER — ONDANSETRON HCL 4 MG/2ML IJ SOLN
4.0000 mg | Freq: Once | INTRAMUSCULAR | Status: AC
  Administered 2012-08-17: 4 mg via INTRAVENOUS
  Filled 2012-08-17: qty 2

## 2012-08-17 MED ORDER — DEXTROSE 5 % IV SOLN
500.0000 mg | INTRAVENOUS | Status: DC
  Administered 2012-08-18: 500 mg via INTRAVENOUS
  Filled 2012-08-17 (×3): qty 500

## 2012-08-17 MED ORDER — ONDANSETRON 8 MG PO TBDP: ORAL_TABLET | ORAL | Status: DC

## 2012-08-17 MED ORDER — ACETAMINOPHEN 325 MG PO TABS
650.0000 mg | ORAL_TABLET | Freq: Four times a day (QID) | ORAL | Status: DC | PRN
  Administered 2012-08-18: 650 mg via ORAL
  Filled 2012-08-17: qty 2

## 2012-08-17 MED ORDER — ENOXAPARIN SODIUM 40 MG/0.4ML ~~LOC~~ SOLN
40.0000 mg | SUBCUTANEOUS | Status: DC
  Administered 2012-08-17 – 2012-08-18 (×2): 40 mg via SUBCUTANEOUS
  Filled 2012-08-17 (×3): qty 0.4

## 2012-08-17 MED ORDER — SODIUM CHLORIDE 0.9 % IV SOLN
INTRAVENOUS | Status: DC
  Administered 2012-08-17 – 2012-08-18 (×4): via INTRAVENOUS

## 2012-08-17 MED ORDER — ENALAPRIL MALEATE 5 MG PO TABS
5.0000 mg | ORAL_TABLET | Freq: Every day | ORAL | Status: DC
  Administered 2012-08-17 – 2012-08-18 (×2): 5 mg via ORAL
  Filled 2012-08-17 (×3): qty 1

## 2012-08-17 MED ORDER — IOHEXOL 350 MG/ML SOLN
80.0000 mL | Freq: Once | INTRAVENOUS | Status: AC | PRN
  Administered 2012-08-17: 80 mL via INTRAVENOUS

## 2012-08-17 MED ORDER — LEVALBUTEROL HCL 0.63 MG/3ML IN NEBU
0.6300 mg | INHALATION_SOLUTION | RESPIRATORY_TRACT | Status: DC | PRN
  Filled 2012-08-17: qty 3

## 2012-08-17 MED ORDER — DEXTROSE 5 % IV SOLN
1.0000 g | INTRAVENOUS | Status: DC
  Administered 2012-08-18: 1 g via INTRAVENOUS
  Filled 2012-08-17 (×3): qty 10

## 2012-08-17 MED ORDER — GUAIFENESIN 100 MG/5ML PO SYRP
200.0000 mg | ORAL_SOLUTION | ORAL | Status: DC | PRN
  Filled 2012-08-17: qty 118

## 2012-08-17 MED ORDER — ONDANSETRON HCL 4 MG/2ML IJ SOLN: 4.0000 mg | Freq: Four times a day (QID) | INTRAMUSCULAR | Status: DC | PRN

## 2012-08-17 MED ORDER — GI COCKTAIL ~~LOC~~
30.0000 mL | Freq: Once | ORAL | Status: AC
  Administered 2012-08-17: 30 mL via ORAL
  Filled 2012-08-17: qty 30

## 2012-08-17 NOTE — H&P (Signed)
PATIENT DETAILS Name: Rhonda Baldwin Age: 32 y.o. Sex: female Date of Birth: 03-10-1981 Admit Date: 08/16/2012 PCP:No primary provider on file.   CHIEF COMPLAINT:  Vomiting  HPI: Patient is a 32 year old African American female with a past medical history of diabetes and hypertension who presented to the hospital for evaluation of the above-noted complaints. Per patient she was in her usual state of health, yesterday she started developing nausea and many episodes of vomiting. She also claimed to have 3-4 loose stools. Because these episodes were persisted, she went to med center [facility] for further evaluation. She was noted to be persistently tachycardic, subsequent CT angiogram of the chest was performed, it did not show any pulmonary embolism but did show multifocal pneumonia. During my evaluation, patient claims she started to develop coffee yesterday evening. It is mostly dry in nature. She denies any fever or nasal congestion. She denies any body aches. She denies any headaches chest pain. She denies any shortness of breath. There is no history of any abdominal pain as well. Because of persistent tachycardia and presence of multifocal pneumonia on CT angiogram of the chest, was transferred to the hospitalist service in [facility] for further evaluation and treatment.   ALLERGIES:   Allergies  Allergen Reactions  . Orange Swelling    PAST MEDICAL HISTORY: Past Medical History  Diagnosis Date  . Hypertension   . Diabetes mellitus     PAST SURGICAL HISTORY: Past Surgical History  Procedure Date  . Cholecystectomy   . Cesarean section     MEDICATIONS AT HOME: Prior to Admission medications   Medication Sig Start Date End Date Taking? Authorizing Provider  acetaminophen (TYLENOL) 500 MG tablet Take 1,500 mg by mouth every 6 (six) hours as needed. For headache.   Yes Historical Provider, MD  enalapril (VASOTEC) 10 MG tablet Take 0.5 tablets (5 mg total) by mouth  daily. 08/07/12  Yes Rolan Bucco, MD  metFORMIN (GLUCOPHAGE) 500 MG tablet Take 1 tablet (500 mg total) by mouth 2 (two) times daily with a meal. 06/10/12  Yes Geoffery Lyons, MD  omeprazole (PRILOSEC) 20 MG capsule Take 1 capsule (20 mg total) by mouth daily. 08/17/12   April K Palumbo-Rasch, MD  ondansetron [facility] ODT) 8 MG disintegrating tablet 8mg  ODT q8 hours prn nausea 08/17/12   April Smitty Cords, MD    FAMILY HISTORY: Family History  Problem Relation Age of Onset  . Hypertension Mother   . Hypertension Father     SOCIAL HISTORY:  reports that she has never smoked. She does not have any smokeless tobacco history on file. She reports that she does not drink alcohol or use illicit drugs.  REVIEW OF SYSTEMS:  Constitutional:   No night sweats,  Fevers, chills, fatigue.  HEENT:    No headaches, Difficulty swallowing,Tooth/dental problems,Sore throat,  No sneezing, itching, ear ache, nasal congestion, post nasal drip,   Cardio-vascular: No chest pain,  Orthopnea, PND, swelling in lower extremities, anasarca, dizziness, palpitations  GI:  No heartburn, indigestion, abdominal pain, change in  bowel habits, loss of appetite  Resp: No shortness of breath with exertion or at rest.  No excess mucus, no productive cough, No non-productive cough,  No coughing up of blood.No change in color of mucus.No wheezing.No chest wall deformity  Skin:  no rash or lesions.  GU:  no dysuria, change in color of urine, no urgency or frequency.  No flank pain.  Musculoskeletal: No joint pain or swelling.  No decreased range  of motion.  No back pain.  Psych: No change in mood or affect. No depression or anxiety.  No memory loss.   PHYSICAL EXAM: Blood pressure 131/84, pulse 130, temperature 99.1 F (37.3 C), temperature source Oral, resp. rate 20, height 5\' 4"  (1.626 m), weight 102.8 kg (226 lb 10.1 oz), last menstrual period 07/28/2012, SpO2 99.00%.  General appearance :Awake, alert, not  in any distress. Speech Clear. Not toxic Looking HEENT: Atraumatic and Normocephalic, pupils equally reactive to light and accomodation Neck: supple, no JVD. No cervical lymphadenopathy.  Chest:Good air entry bilaterally, a few by basilar rales.  CVS: S1 S2 regular, no murmurs. Heart sounds are very tachycardic. Abdomen: Bowel sounds present, Non tender and not distended with no gaurding, rigidity or rebound. Extremities: B/L Lower Ext shows no edema, both legs are warm to touch Neurology: Awake alert, and oriented X 3, CN II-XII intact, Non focal Skin:No Rash Wounds:N/A  LABS ON ADMISSION:   Basename 08/17/12 0035  NA 137  K 4.2  CL 101  CO2 21  GLUCOSE 252*  BUN 16  CREATININE 0.30*  CALCIUM 9.7  MG --  PHOS --    Basename 08/17/12 0035  AST 28  ALT 24  ALKPHOS 106  BILITOT 0.8  PROT 7.2  ALBUMIN 3.4*   No results found for this basename: LIPASE:2,AMYLASE:2 in the last 72 hours  Basename 08/17/12 1003 08/17/12 0035  WBC 7.3 9.2  NEUTROABS -- 7.5  HGB 11.6* 13.0  HCT 34.7* 37.9  MCV 78.7 78.5  PLT 148* 169   No results found for this basename: CKTOTAL:3,CKMB:3,CKMBINDEX:3,TROPONINI:3 in the last 72 hours No results found for this basename: DDIMER:2 in the last 72 hours No components found with this basename: POCBNP:3   RADIOLOGIC STUDIES ON ADMISSION: Dg Chest 2 View  08/17/2012  *RADIOLOGY REPORT*  Clinical Data: Nausea, vomiting and shortness of breath.  CHEST - 2 VIEW  Comparison: Chest radiograph performed 11/07/2010  Findings: The lungs are well-aerated.  Patchy left-sided airspace opacification raises concern for pneumonia.  Mild vascular congestion is noted.  There is no evidence of pleural effusion or pneumothorax.  The heart is borderline normal in size; the mediastinal contour is within normal limits.  No acute osseous abnormalities are seen.  IMPRESSION:  1.  Patchy left-sided airspace opacification, concerning for pneumonia. 2.  Mild vascular congestion  noted.   Original Report Authenticated By: Tonia Ghent, M.D.    Ct Angio Chest Pe W/cm &/or Wo Cm  08/17/2012  *RADIOLOGY REPORT*  Clinical Data: Chest pain, shortness breath, nausea and vomiting. Hemoptysis and hypoxia.  CT ANGIOGRAPHY CHEST  Technique:  Multidetector CT imaging of the chest using the standard protocol during bolus administration of intravenous contrast. Multiplanar reconstructed images including MIPs were obtained and reviewed to evaluate the vascular anatomy.  Contrast: 80mL OMNIPAQUE IOHEXOL 350 MG/ML SOLN  Comparison: Chest radiograph performed earlier today at 03:17 a.m.  Findings: There is no evidence of significant pulmonary embolus.  Relatively diffuse bilateral airspace opacification is noted, worse on the left, with sparing of the right middle lobe.  Findings are suspicious for multifocal pneumonia.  There is no evidence of pleural effusion or pneumothorax.  No masses are identified; no abnormal focal contrast enhancement is seen.  The mediastinum is unremarkable in appearance.  No mediastinal lymphadenopathy is seen.  No pericardial effusion is identified. The great vessels are grossly unremarkable in appearance.  The thymic tissue is within normal limits.  No axillary lymphadenopathy is seen.  The visualized  portions of the thyroid gland are unremarkable in appearance.  The visualized portions of the liver and spleen are unremarkable.  No acute osseous abnormalities are seen.  IMPRESSION:  1.  No evidence of significant pulmonary embolus. 2.  Relatively diffuse bilateral airspace opacification, worse on the left, with sparing of the right middle lobe.  Findings suspicious for multifocal pneumonia.   Original Report Authenticated By: Tonia Ghent, M.D.     ASSESSMENT AND PLAN: Present on Admission:  . Pneumonia - Likely community acquired pneumonia, will empirically start on Rocephin and Zithromax.  - Blood cultures if febrile  - Exam she had a influenza vaccination earlier  this year, and I doubt influenza in this patient-however we'll obtain a influenza panel   . Nausea vomiting and diarrhea - Probably secondary to viral gastroenteritis  - Will hydrate and slowly advance diet.   . Gastritis - Place on PPI   . HTN (hypertension) - Continue with enalapril   . DM (diabetes mellitus) - Hold metformin, place on sliding scale insulin while inpatient   . SIRS (systemic inflammatory response syndrome) - Persistently tachycardic in the 130s. Appears to be sinus tachycardia, we'll hydrate and see if this is still persistent. -If still persistent we'll order a 2-D echocardiogram and a TSH. -Awaiting 12-lead EKG  Further plan will depend as patient's clinical course evolves and further radiologic and laboratory data become available. Patient will be monitored closely.   DVT Prophylaxis: - Prophylactic Lovenox  Code Status: - Full code  Total time spent for admission equals 45 minutes.  [facility] Pager [phone]  If 7PM-7AM, please contact night-coverage [email or web address] Password [facility] 08/17/2012, 10:24 AM

## 2012-08-17 NOTE — Significant Event (Signed)
Called by Dr. Nicanor Alcon regarding a 32 yr old female with myultifocal PNa and tachycardia Wbc 9. Temp 99.7  Persistently tachycardic at rest. Given abx Ceftriaxone and Azitrhomycin Advised to Obs for PNa on tele bed at [facility]  Pleas Koch, MD [facility] [phone]

## 2012-08-17 NOTE — ED Notes (Signed)
MD aware of discharge vitals.  Order for CXR placed.

## 2012-08-17 NOTE — ED Provider Notes (Addendum)
History     CSN: [id number]  Arrival date & time 08/16/12  2218   First MD Initiated Contact with Patient 08/17/12 0033      Chief Complaint  Patient presents with  . Emesis    (Consider location/radiation/quality/duration/timing/severity/associated sxs/prior treatment) Patient is a 32 y.o. female presenting with vomiting. The history is provided by the patient. No language interpreter was used.  Emesis  This is a new problem. The current episode started 3 to 5 hours ago. The problem occurs 2 to 4 times per day. The problem has not changed since onset.The emesis has an appearance of stomach contents. There has been no fever. Associated symptoms include diarrhea. Associated symptoms comments: Abdominal cramping. Risk factors: none.    Past Medical History  Diagnosis Date  . Hypertension   . Diabetes mellitus     Past Surgical History  Procedure Date  . Cholecystectomy   . Cesarean section     Family History  Problem Relation Age of Onset  . Hypertension Mother   . Hypertension Father     History  Substance Use Topics  . Smoking status: Never Smoker   . Smokeless tobacco: Not on file  . Alcohol Use: No    OB History    Grav Para Term Preterm Abortions TAB SAB Ect Mult Living                  Review of Systems  Gastrointestinal: Positive for nausea, vomiting and diarrhea.  All other systems reviewed and are negative.    Allergies  Orange  Home Medications   Current Outpatient Rx  Name  Route  Sig  Dispense  Refill  . ACETAMINOPHEN 500 MG PO TABS   Oral   Take 1,500 mg by mouth every 6 (six) hours as needed. For headache.         . AZITHROMYCIN 250 MG PO TABS   Oral   Take 1 tablet (250 mg total) by mouth daily. Take first 2 tablets together, then 1 every day until finished.   6 tablet   0   . ENALAPRIL MALEATE 10 MG PO TABS   Oral   Take 0.5 tablets (5 mg total) by mouth daily.   30 tablet   1   . ENALAPRIL MALEATE 5 MG PO TABS   Oral  Take 1 tablet (5 mg total) by mouth daily.   30 tablet   1   . METFORMIN HCL 500 MG PO TABS   Oral   Take 1 tablet (500 mg total) by mouth 2 (two) times daily with a meal.   60 tablet   1     BP 188/85  Pulse 124  Temp 99.9 F (37.7 C) (Oral)  Resp 20  Ht 5\' 4"  (1.626 m)  Wt 225 lb (102.059 kg)  BMI 38.62 kg/m2  SpO2 95%  LMP 07/28/2012  Physical Exam  Constitutional: She is oriented to person, place, and time. She appears well-developed and well-nourished. No distress.  HENT:  Head: Normocephalic and atraumatic.  Mouth/Throat: Oropharynx is clear and moist.  Eyes: Conjunctivae normal are normal. Pupils are equal, round, and reactive to light.  Neck: Normal range of motion. Neck supple.  Cardiovascular: Regular rhythm and intact distal pulses.  Tachycardia present.   Pulmonary/Chest: Effort normal and breath sounds normal. No respiratory distress. She has no wheezes. She has no rales.  Abdominal: Soft. Bowel sounds are normal. She exhibits no mass. There is no tenderness. There is no rebound and  no guarding.  Musculoskeletal: Normal range of motion.  Neurological: She is alert and oriented to person, place, and time.  Skin: Skin is warm and dry.  Psychiatric: She has a normal mood and affect.    ED Course  Procedures (including critical care time)  Labs Reviewed  URINALYSIS, ROUTINE W REFLEX MICROSCOPIC - Abnormal; Notable for the following:    APPearance CLOUDY (*)     Specific Gravity, Urine 1.042 (*)     Glucose, UA >1000 (*)     Hgb urine dipstick TRACE (*)     Ketones, ur 15 (*)     Protein, ur >300 (*)     All other components within normal limits  CBC WITH DIFFERENTIAL - Abnormal; Notable for the following:    Neutrophils Relative 82 (*)     Lymphocytes Relative 9 (*)     All other components within normal limits  COMPREHENSIVE METABOLIC PANEL - Abnormal; Notable for the following:    Glucose, Bld 252 (*)     Creatinine, Ser 0.30 (*)     Albumin 3.4  (*)     All other components within normal limits  URINE MICROSCOPIC-ADD ON - Abnormal; Notable for the following:    Squamous Epithelial / LPF FEW (*)     Bacteria, UA MANY (*)     Casts GRANULAR CAST (*)     All other components within normal limits  PREGNANCY, URINE   No results found.   No diagnosis found.    MDM  Nausea vomiting and diarrhea, exam labs and vitals reassuring.  Feels improved post meds from an abdominal stand point.  But patient remained significantly tachycardic in the 130-140s despite fluid resuscitation and sats on RA were 90%,  Patient hemoptysis to nurse at that time.  Patient had previously denied cough.  Reexamined the patient and lungs remained clear.  No wheezes or rales on exam by EDP.  No edema in the lower extremities.  Patient placed on 2 L [place].  Examined by RT.  No indication for nebs at this time.  CXR performed.  Based on continued tachycardia and hypoxia on room air with multifocal pneumonia will admit to medicine for IV abx.       Jasmine Awe, MD 08/17/12 [phone]

## 2012-08-17 NOTE — ED Notes (Signed)
Pt assigned to room 2038 per [place] with [facility]. Call placed to [address], RN states that they are very busy right now and it was not a good time to take report. Informed RN that [facility] was enroute with pt and they could take report at bs.

## 2012-08-17 NOTE — ED Notes (Signed)
2 L O2 applied Lead

## 2012-08-17 NOTE — ED Notes (Signed)
Pt report given to Sean, RN with [facility] 

## 2012-08-17 NOTE — ED Notes (Signed)
Pt report given to Tina, RN

## 2012-08-17 NOTE — Progress Notes (Signed)
Utilization Review Completed.Rhonda Baldwin T1/10/2012   

## 2012-08-17 NOTE — ED Notes (Signed)
Pt. C/o coughing up bloody sputum.

## 2012-08-17 NOTE — ED Notes (Signed)
Placed patient on heart monitor per nurse/MD orders.

## 2012-08-17 NOTE — ED Notes (Signed)
Patient transported to CT 

## 2012-08-18 DIAGNOSIS — I10 Essential (primary) hypertension: Secondary | ICD-10-CM

## 2012-08-18 LAB — COMPREHENSIVE METABOLIC PANEL
ALT: 25 U/L (ref 0–35)
AST: 22 U/L (ref 0–37)
Albumin: 2.9 g/dL — ABNORMAL LOW (ref 3.5–5.2)
Alkaline Phosphatase: 92 U/L (ref 39–117)
BUN: 8 mg/dL (ref 6–23)
CO2: 19 mEq/L (ref 19–32)
Calcium: 9.4 mg/dL (ref 8.4–10.5)
Chloride: 104 mEq/L (ref 96–112)
Creatinine, Ser: 0.25 mg/dL — ABNORMAL LOW (ref 0.50–1.10)
GFR calc Af Amer: >90 mL/min (ref >90)
GFR calc non Af Amer: >90 mL/min (ref >90)
Glucose, Bld: 188 mg/dL — ABNORMAL HIGH (ref 70–99)
Potassium: 3.8 mEq/L (ref 3.5–5.1)
Sodium: 135 mEq/L (ref 135–145)
Total Bilirubin: 0.3 mg/dL (ref 0.3–1.2)
Total Protein: 6.5 g/dL (ref 6.0–8.3)

## 2012-08-18 LAB — CBC
HCT: 31.9 % — ABNORMAL LOW (ref 36.0–46.0)
Hemoglobin: 10.3 g/dL — ABNORMAL LOW (ref 12.0–15.0)
MCH: 25.8 pg — ABNORMAL LOW (ref 26.0–34.0)
MCHC: 32.3 g/dL (ref 30.0–36.0)
MCV: 79.9 fL (ref 78.0–100.0)
Platelets: 132 10*3/uL — ABNORMAL LOW (ref 150–400)
RBC: 3.99 MIL/uL (ref 3.87–5.11)
RDW: 13.1 % (ref 11.5–15.5)
WBC: 5.3 10*3/uL (ref 4.0–10.5)

## 2012-08-18 LAB — T3: T3, Total: 230.7 ng/dl — ABNORMAL HIGH (ref 80.0–204.0)

## 2012-08-18 LAB — GLUCOSE, CAPILLARY
Glucose-Capillary: 153 mg/dL — ABNORMAL HIGH (ref 70–99)
Glucose-Capillary: 162 mg/dL — ABNORMAL HIGH (ref 70–99)
Glucose-Capillary: 154 mg/dL — ABNORMAL HIGH (ref 70–99)
Glucose-Capillary: 225 mg/dL — ABNORMAL HIGH (ref 70–99)

## 2012-08-18 LAB — T4, FREE: Free T4: 2.92 ng/dL — ABNORMAL HIGH (ref 0.80–1.80)

## 2012-08-18 LAB — TSH: TSH: <0.008 u[IU]/mL — ABNORMAL LOW (ref 0.350–4.500)

## 2012-08-18 MED ORDER — METOPROLOL TARTRATE 25 MG PO TABS
25.0000 mg | ORAL_TABLET | Freq: Two times a day (BID) | ORAL | Status: DC
  Administered 2012-08-18: 25 mg via ORAL
  Filled 2012-08-18 (×3): qty 1

## 2012-08-18 NOTE — Progress Notes (Signed)
  Echocardiogram 2D Echocardiogram has been performed.  [place] 08/18/2012, 4:24 PM

## 2012-08-18 NOTE — Progress Notes (Signed)
Patient ID: Rhonda Baldwin  female  ZOX:[id number]    DOB: 1981/04/21    DOA: 08/16/2012  PCP: No primary provider on file.  Assessment/Plan:   Community acquired Pneumonia- patchy left sided/ multifocal PNA  - Continue IV on Rocephin and Zithromax.  - Blood cultures if febrile, influenza negative, HIV neg, urine strept neg   . Nausea vomiting and diarrhea: improving  - Probably secondary to viral gastroenteritis  - Dec IVF, advance diet  . Gastritis  - Place on PPI   . HTN (hypertension)  - Continue with enalapril   . DM (diabetes mellitus)  - Hold metformin, cont sliding scale insulin while inpatient   . Sinus Tachycardia likely sec to ac illness and undiagnosed hyperthyroidism - ordred 2D ECHO and  - EKG showed sinus tachycardia  Hyperthyroidism: On review of records, TSH from 05/2012 was <0.008 - TSH obtained this admission is pending, obtain T3, T4 levels, if elevated, will start on tapazole and place outpatient endocrinology referral  DVT Prophylaxis: lovenox  Code Status: FC  Disposition: hopefully tomorrow    Subjective: Feeling better today, still tachycardia   Objective: Weight change: 0.741 kg (1 lb 10.1 oz)  Intake/Output Summary (Last 24 hours) at 08/18/12 1336 Last data filed at 08/18/12 1004  Gross per 24 hour  Intake    480 ml  Output      0 ml  Net    480 ml   Blood pressure 141/67, pulse 114, temperature 98.7 F (37.1 C), temperature source Oral, resp. rate 19, height 5\' 4"  (1.626 m), weight 102.8 kg (226 lb 10.1 oz), last menstrual period 07/28/2012, SpO2 97.00%.  Physical Exam: General: Alert and awake, oriented x3, not in any acute distress. HEENT: anicteric sclera, pupils reactive to light and accommodation, EOMI CVS: S1-S2 clear, no murmur rubs or gallops Chest: clear to auscultation bilaterally, no wheezing, rales or rhonchi Abdomen: soft nontender, nondistended, normal bowel sounds, no organomegaly Extremities: no cyanosis, clubbing or  edema noted bilaterally Neuro: Cranial nerves II-XII intact, no focal neurological deficits  Lab Results: Basic Metabolic Panel:  Lab 08/17/12 8119 08/17/12 0035  NA -- 137  K -- 4.2  CL -- 101  CO2 -- 21  GLUCOSE -- 252*  BUN -- 16  CREATININE 0.30* 0.30*  CALCIUM -- 9.7  MG -- --  PHOS -- --   Liver Function Tests:  Lab 08/17/12 0035  AST 28  ALT 24  ALKPHOS 106  BILITOT 0.8  PROT 7.2  ALBUMIN 3.4*    Lab 08/17/12 1003  LIPASE 14  AMYLASE --   No results found for this basename: AMMONIA:2 in the last 168 hours CBC:  Lab 08/17/12 1003 08/17/12 0035  WBC 7.3 9.2  NEUTROABS -- 7.5  HGB 11.6* 13.0  HCT 34.7* 37.9  MCV 78.7 78.5  PLT 148* 169   CBG:  Lab 08/18/12 1116 08/18/12 0629 08/17/12 2126 08/17/12 1611 08/17/12 1102  GLUCAP 162* 153* 137* 151* 191*      Studies/Results: Dg Chest 2 View  08/17/2012  *RADIOLOGY REPORT*  Clinical Data: Nausea, vomiting and shortness of breath.  CHEST - 2 VIEW  Comparison: Chest radiograph performed 11/07/2010  Findings: The lungs are well-aerated.  Patchy left-sided airspace opacification raises concern for pneumonia.  Mild vascular congestion is noted.  There is no evidence of pleural effusion or pneumothorax.  The heart is borderline normal in size; the mediastinal contour is within normal limits.  No acute osseous abnormalities are seen.  IMPRESSION:  1.  Patchy left-sided airspace opacification, concerning for pneumonia. 2.  Mild vascular congestion noted.   Original Report Authenticated By: Tonia Ghent, M.D.    Ct Angio Chest Pe W/cm &/or Wo Cm  08/17/2012  *RADIOLOGY REPORT*  Clinical Data: Chest pain, shortness breath, nausea and vomiting. Hemoptysis and hypoxia.  CT ANGIOGRAPHY CHEST  Technique:  Multidetector CT imaging of the chest using the standard protocol during bolus administration of intravenous contrast. Multiplanar reconstructed images including MIPs were obtained and reviewed to evaluate the vascular  anatomy.  Contrast: 80mL OMNIPAQUE IOHEXOL 350 MG/ML SOLN  Comparison: Chest radiograph performed earlier today at 03:17 a.m.  Findings: There is no evidence of significant pulmonary embolus.  Relatively diffuse bilateral airspace opacification is noted, worse on the left, with sparing of the right middle lobe.  Findings are suspicious for multifocal pneumonia.  There is no evidence of pleural effusion or pneumothorax.  No masses are identified; no abnormal focal contrast enhancement is seen.  The mediastinum is unremarkable in appearance.  No mediastinal lymphadenopathy is seen.  No pericardial effusion is identified. The great vessels are grossly unremarkable in appearance.  The thymic tissue is within normal limits.  No axillary lymphadenopathy is seen.  The visualized portions of the thyroid gland are unremarkable in appearance.  The visualized portions of the liver and spleen are unremarkable.  No acute osseous abnormalities are seen.  IMPRESSION:  1.  No evidence of significant pulmonary embolus. 2.  Relatively diffuse bilateral airspace opacification, worse on the left, with sparing of the right middle lobe.  Findings suspicious for multifocal pneumonia.   Original Report Authenticated By: Tonia Ghent, M.D.     Medications: Scheduled Meds:   . azithromycin  500 mg Intravenous Q24H  . cefTRIAXone (ROCEPHIN)  IV  1 g Intravenous Q24H  . enalapril  5 mg Oral Daily  . enoxaparin (LOVENOX) injection  40 mg Subcutaneous Q24H  . insulin aspart  0-9 Units Subcutaneous TID WC      LOS: 2 days   Alenna Russell M.D. [facility] 08/18/2012, 1:36 PM Pager: [phone]  If 7PM-7AM, please contact night-coverage [email or web address] Password [email or web address]

## 2012-08-19 DIAGNOSIS — E0590 Thyrotoxicosis, unspecified without thyrotoxic crisis or storm: Secondary | ICD-10-CM | POA: Diagnosis present

## 2012-08-19 LAB — GLUCOSE, CAPILLARY
Glucose-Capillary: 149 mg/dL — ABNORMAL HIGH (ref 70–99)
Glucose-Capillary: 158 mg/dL — ABNORMAL HIGH (ref 70–99)

## 2012-08-19 MED ORDER — METHIMAZOLE 10 MG PO TABS
20.0000 mg | ORAL_TABLET | Freq: Every day | ORAL | Status: DC
  Administered 2012-08-19: 20 mg via ORAL
  Filled 2012-08-19: qty 2

## 2012-08-19 MED ORDER — METHIMAZOLE 10 MG PO TABS: 20.0000 mg | ORAL_TABLET | Freq: Every day | ORAL | Status: DC

## 2012-08-19 MED ORDER — ATENOLOL 50 MG PO TABS: 50.0000 mg | ORAL_TABLET | Freq: Every day | ORAL | Status: DC

## 2012-08-19 MED ORDER — CEFUROXIME AXETIL 500 MG PO TABS: 500.0000 mg | ORAL_TABLET | Freq: Two times a day (BID) | ORAL | Status: DC

## 2012-08-19 MED ORDER — ATENOLOL 50 MG PO TABS
50.0000 mg | ORAL_TABLET | Freq: Every day | ORAL | Status: DC
  Administered 2012-08-19: 50 mg via ORAL
  Filled 2012-08-19: qty 1

## 2012-08-19 MED ORDER — AZITHROMYCIN 250 MG PO TABS: ORAL_TABLET | ORAL | Status: DC

## 2012-08-19 NOTE — Progress Notes (Signed)
   CARE MANAGEMENT NOTE 08/19/2012  Patient:  Rhonda Baldwin, Rhonda Baldwin   Account Number:  [id number]  Date Initiated:  08/19/2012  Documentation initiated by:  [facility]  Subjective/Objective Assessment:     Action/Plan:   Anticipated DC Date:  08/19/2012   Anticipated DC Plan:  HOME/SELF CARE      DC Planning Services  CM consult  MATCH Program      Choice offered to / List presented to:             Status of service:  Completed, signed off Medicare Important Message given?   (If response is "NO", the following Medicare IM given date fields will be blank) Date Medicare IM given:   Date Additional Medicare IM given:    Discharge Disposition:  HOME/SELF CARE  Per UR Regulation:    If discussed at Long Length of Stay Meetings, dates discussed:    Comments:  08/19/2012 1300 NCM spoke to pt and MATCH program was arranged for medications. Provided pt with information and resources for follow up for PCP. Contact info placed on d/c instruction and gave pt resource packet with PCP resources, DSS number, and other community resources. 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   Isidoro Donning RN CCM Case Mgmt phone [phone]

## 2012-08-19 NOTE — Discharge Summary (Addendum)
Physician Discharge Summary  Patient ID: Rhonda Baldwin MRN: [id number] DOB/AGE: 34-Oct-1982 34 y.o.  Admit date: 08/16/2012 Discharge date: 08/19/2012  Primary Care Physician:  No primary provider on file.  Discharge Diagnoses:    . Pneumonia . Nausea vomiting and diarrhea- resolved . Gastritis . HTN (hypertension) . DM (diabetes mellitus) . SIRS (systemic inflammatory response syndrome)- resolved . Primary hyperthyroidism- New diagnosis  Consults:  None  Discharge Medications:   Medication List     As of 08/19/2012 12:29 PM    STOP taking these medications         enalapril 10 MG tablet   Commonly known as: VASOTEC      TAKE these medications         acetaminophen 500 MG tablet   Commonly known as: TYLENOL   Take 1,500 mg by mouth every 6 (six) hours as needed. For headache.      atenolol 50 MG tablet   Commonly known as: TENORMIN   Take 1 tablet (50 mg total) by mouth daily.      azithromycin 250 MG tablet   Commonly known as: ZITHROMAX   Take 1 tab daily for 6 days      cefUROXime 500 MG tablet   Commonly known as: CEFTIN   Take 1 tablet (500 mg total) by mouth 2 (two) times daily. For 6 days      metFORMIN 500 MG tablet   Commonly known as: GLUCOPHAGE   Take 1 tablet (500 mg total) by mouth 2 (two) times daily with a meal.      methimazole 10 MG tablet   Commonly known as: TAPAZOLE   Take 2 tablets (20 mg total) by mouth daily.      omeprazole 20 MG capsule   Commonly known as: PRILOSEC   Take 1 capsule (20 mg total) by mouth daily.      ondansetron 8 MG disintegrating tablet   Commonly known as: ZOFRAN-ODT   8mg  ODT q8 hours prn nausea          Brief H and P: For complete details please refer to admission H and P, but in brief Patient is a 32 year old [place] American female with a past medical history of diabetes and hypertension who presented to the hospital for evaluation of the above-noted complaints. Per patient she was in her usual state of  health until the day before when she started developing nausea and many episodes of vomiting. She also claimed to have 3-4 loose stools. Because these episodes were persisted, she went to med center [facility] for further evaluation. She was noted to be persistently tachycardic, subsequent CT angiogram of the chest was performed, it did not show any pulmonary embolism but did show multifocal pneumonia. Because of persistent tachycardia and presence of multifocal pneumonia on CT angiogram of the chest, was transferred to the hospitalist service in [facility] for further evaluation and treatment.   Hospital Course:   Community acquired Pneumonia- patchy left sided/ multifocal PNA; patient was placed on IV Rocephin and Zithromax and transitioned to oral  Azithromax and ceftin for 6 more days. Influenza was negative, HIV neg, urine strept was neg   . Nausea vomiting and diarrhea: Resolved, probably secondary to viral gastroenteritis   . HTN (hypertension); placed on atenolol sec to tachycardia.   . DM (diabetes mellitus). Metformin was held during the hospitalization patient was placed on sliding scale insulin    . Sinus Tachycardia likely sec to ac illness and undiagnosed hyperthyroidism  On review of records, TSH from 05/2012 was <0.008.  TSH was <0.008, free T4 -2.92 with total T3-230.7 (elevated) The patient was started on tapazole and placed outpatient endocrinology referral.     Day of Discharge BP 143/72  Pulse 93  Temp 98.6 F (37 C) (Oral)  Resp 18  Ht 5\' 4"  (1.626 m)  Wt 102.8 kg (226 lb 10.1 oz)  BMI 38.90 kg/m2  SpO2 99%  LMP 07/28/2012  Physical Exam: General: Alert and awake oriented x3 not in any acute distress. HEENT: anicteric sclera, pupils reactive to light and accommodation CVS: S1-S2 clear no murmur rubs or gallops Chest: clear to auscultation bilaterally, no wheezing rales or rhonchi Abdomen: soft nontender, nondistended, normal bowel sounds, no  organomegaly Extremities: no cyanosis, clubbing or edema noted bilaterally Neuro: Cranial nerves II-XII intact, no focal neurological deficits   The results of significant diagnostics from this hospitalization (including imaging, microbiology, ancillary and laboratory) are listed below for reference.    LAB RESULTS: Basic Metabolic Panel:  Lab 08/18/12 2130 08/17/12 1003 08/17/12 0035  NA 135 -- 137  K 3.8 -- 4.2  CL 104 -- 101  CO2 19 -- 21  GLUCOSE 188* -- 252*  BUN 8 -- 16  CREATININE 0.25* 0.30* --  CALCIUM 9.4 -- 9.7  MG -- -- --  PHOS -- -- --   Liver Function Tests:  Lab 08/18/12 1541 08/17/12 0035  AST 22 28  ALT 25 24  ALKPHOS 92 106  BILITOT 0.3 0.8  PROT 6.5 7.2  ALBUMIN 2.9* 3.4*    Lab 08/17/12 1003  LIPASE 14  AMYLASE --   No results found for this basename: AMMONIA:2 in the last 168 hours CBC:  Lab 08/18/12 1541 08/17/12 1003 08/17/12 0035  WBC 5.3 7.3 --  NEUTROABS -- -- 7.5  HGB 10.3* 11.6* --  HCT 31.9* 34.7* --  MCV 79.9 -- --  PLT 132* 148* --   Cardiac Enzymes: No results found for this basename: CKTOTAL:2,CKMB:2,CKMBINDEX:2,TROPONINI:2 in the last 168 hours BNP: No components found with this basename: POCBNP:2 CBG:  Lab 08/19/12 1135 08/19/12 0616  GLUCAP 158* 149*    Significant Diagnostic Studies:  Dg Chest 2 View  08/17/2012  *RADIOLOGY REPORT*  Clinical Data: Nausea, vomiting and shortness of breath.  CHEST - 2 VIEW  Comparison: Chest radiograph performed 11/07/2010  Findings: The lungs are well-aerated.  Patchy left-sided airspace opacification raises concern for pneumonia.  Mild vascular congestion is noted.  There is no evidence of pleural effusion or pneumothorax.  The heart is borderline normal in size; the mediastinal contour is within normal limits.  No acute osseous abnormalities are seen.  IMPRESSION:  1.  Patchy left-sided airspace opacification, concerning for pneumonia. 2.  Mild vascular congestion noted.   Original  Report Authenticated By: Tonia Ghent, M.D.    Ct Angio Chest Pe W/cm &/or Wo Cm  08/17/2012  *RADIOLOGY REPORT*  Clinical Data: Chest pain, shortness breath, nausea and vomiting. Hemoptysis and hypoxia.  CT ANGIOGRAPHY CHEST  Technique:  Multidetector CT imaging of the chest using the standard protocol during bolus administration of intravenous contrast. Multiplanar reconstructed images including MIPs were obtained and reviewed to evaluate the vascular anatomy.  Contrast: 80mL OMNIPAQUE IOHEXOL 350 MG/ML SOLN  Comparison: Chest radiograph performed earlier today at 03:17 a.m.  Findings: There is no evidence of significant pulmonary embolus.  Relatively diffuse bilateral airspace opacification is noted, worse on the left, with sparing of the right middle lobe.  Findings are  suspicious for multifocal pneumonia.  There is no evidence of pleural effusion or pneumothorax.  No masses are identified; no abnormal focal contrast enhancement is seen.  The mediastinum is unremarkable in appearance.  No mediastinal lymphadenopathy is seen.  No pericardial effusion is identified. The great vessels are grossly unremarkable in appearance.  The thymic tissue is within normal limits.  No axillary lymphadenopathy is seen.  The visualized portions of the thyroid gland are unremarkable in appearance.  The visualized portions of the liver and spleen are unremarkable.  No acute osseous abnormalities are seen.  IMPRESSION:  1.  No evidence of significant pulmonary embolus. 2.  Relatively diffuse bilateral airspace opacification, worse on the left, with sparing of the right middle lobe.  Findings suspicious for multifocal pneumonia.   Original Report Authenticated By: Tonia Ghent, M.D.      Disposition and Follow-up: Discharge Orders    Future Orders Please Complete By Expires   Ambulatory referral to Endocrinology      Comments:   32year old female with newly diagnosed hyperthyroidism, started on tapazole, need  Endocrinology follow-up   Diet Carb Modified      Increase activity slowly      Discharge instructions      Comments:   Please get TSH, free T4, T3 levels checked again in 4 weeks. Out-patient Endocrinology referral sent.       DISPOSITION: Home  DIET: Carb modified diet ACTIVITY: As tolerated  TESTS THAT NEED FOLLOW-UP Check TSH, FT4, Total T3 levels again in 4 weeks  DISCHARGE FOLLOW-UP Case-management provided information for [facility] urgent care, [facility] for follow-up Follow-up Information    Please follow up. (your own doctor in 2 days)          Time spent on Discharge: 35 mins  Signed:   RAI,RIPUDEEP M.D. [facility] 08/19/2012, 12:29 PM Pager: [phone]    Addendum: Patient's tachycardia could be due to SIRS from pneumonia/viral gastroenteritis. However, she was also diagnosed with primary hyperthyroidism during this admission which contributes towards tachycardia.  RAI,RIPUDEEP M.D. [facility]  09/04/2012 at 7 PM

## 2012-08-22 LAB — LEGIONELLA ANTIGEN, URINE: Legionella Antigen, Urine: NEGATIVE

## 2013-05-01 ENCOUNTER — Emergency Department (HOSPITAL_COMMUNITY): Payer: 59

## 2013-05-01 ENCOUNTER — Encounter (HOSPITAL_COMMUNITY): Payer: Self-pay | Admitting: Emergency Medicine

## 2013-05-01 ENCOUNTER — Emergency Department (HOSPITAL_COMMUNITY)
Admission: EM | Admit: 2013-05-01 | Discharge: 2013-05-02 | Disposition: A | Discharge status: Home / Self Care | Payer: 59 | Attending: Emergency Medicine | Admitting: Emergency Medicine

## 2013-05-01 ENCOUNTER — Inpatient Hospital Stay (HOSPITAL_COMMUNITY)
Admission: AD | Admit: 2013-05-01 | Discharge: 2013-05-01 | Disposition: A | Discharge status: Home / Self Care | Payer: 59 | Source: Ambulatory Visit | Attending: Obstetrics & Gynecology | Admitting: Obstetrics & Gynecology

## 2013-05-01 DIAGNOSIS — J159 Unspecified bacterial pneumonia: Secondary | ICD-10-CM | POA: Insufficient documentation

## 2013-05-01 DIAGNOSIS — J45909 Unspecified asthma, uncomplicated: Secondary | ICD-10-CM | POA: Insufficient documentation

## 2013-05-01 DIAGNOSIS — E0590 Thyrotoxicosis, unspecified without thyrotoxic crisis or storm: Secondary | ICD-10-CM | POA: Insufficient documentation

## 2013-05-01 DIAGNOSIS — Z3202 Encounter for pregnancy test, result negative: Secondary | ICD-10-CM | POA: Insufficient documentation

## 2013-05-01 DIAGNOSIS — J189 Pneumonia, unspecified organism: Secondary | ICD-10-CM

## 2013-05-01 DIAGNOSIS — Z79899 Other long term (current) drug therapy: Secondary | ICD-10-CM | POA: Insufficient documentation

## 2013-05-01 DIAGNOSIS — J45901 Unspecified asthma with (acute) exacerbation: Secondary | ICD-10-CM

## 2013-05-01 DIAGNOSIS — Z792 Long term (current) use of antibiotics: Secondary | ICD-10-CM | POA: Insufficient documentation

## 2013-05-01 DIAGNOSIS — E119 Type 2 diabetes mellitus without complications: Secondary | ICD-10-CM | POA: Insufficient documentation

## 2013-05-01 DIAGNOSIS — I10 Essential (primary) hypertension: Secondary | ICD-10-CM | POA: Insufficient documentation

## 2013-05-01 DIAGNOSIS — R000 Tachycardia, unspecified: Secondary | ICD-10-CM | POA: Insufficient documentation

## 2013-05-01 DIAGNOSIS — R0602 Shortness of breath: Secondary | ICD-10-CM | POA: Insufficient documentation

## 2013-05-01 LAB — BASIC METABOLIC PANEL
BUN: 17 mg/dL (ref 6–23)
CO2: 21 mEq/L (ref 19–32)
Calcium: 9.3 mg/dL (ref 8.4–10.5)
Chloride: 103 mEq/L (ref 96–112)
Creatinine, Ser: 0.49 mg/dL — ABNORMAL LOW (ref 0.50–1.10)
GFR calc Af Amer: >90 mL/min (ref >90)
GFR calc non Af Amer: >90 mL/min (ref >90)
Glucose, Bld: 271 mg/dL — ABNORMAL HIGH (ref 70–99)
Potassium: 3.9 mEq/L (ref 3.5–5.1)
Sodium: 136 mEq/L (ref 135–145)

## 2013-05-01 LAB — TROPONIN I: Troponin I: <0.3 ng/mL (ref <0.30)

## 2013-05-01 LAB — D-DIMER, QUANTITATIVE (NOT AT ARMC): D-Dimer, Quant: 1.3 ug/mL-FEU — ABNORMAL HIGH (ref 0.00–0.48)

## 2013-05-01 MED ORDER — ALBUTEROL SULFATE (5 MG/ML) 0.5% IN NEBU
INHALATION_SOLUTION | RESPIRATORY_TRACT | Status: AC
  Filled 2013-05-01: qty 0.5

## 2013-05-01 MED ORDER — METHIMAZOLE 10 MG PO TABS
20.0000 mg | ORAL_TABLET | Freq: Once | ORAL | Status: AC
  Administered 2013-05-01: 20 mg via ORAL
  Filled 2013-05-01: qty 2

## 2013-05-01 MED ORDER — IOHEXOL 350 MG/ML SOLN
100.0000 mL | Freq: Once | INTRAVENOUS | Status: AC | PRN
  Administered 2013-05-01: 100 mL via INTRAVENOUS

## 2013-05-01 MED ORDER — ALBUTEROL SULFATE (5 MG/ML) 0.5% IN NEBU
2.5000 mg | INHALATION_SOLUTION | Freq: Once | RESPIRATORY_TRACT | Status: AC
  Administered 2013-05-01: 2.5 mg via RESPIRATORY_TRACT

## 2013-05-01 MED ORDER — PREDNISONE 20 MG PO TABS
60.0000 mg | ORAL_TABLET | Freq: Once | ORAL | Status: AC
  Administered 2013-05-01: 60 mg via ORAL
  Filled 2013-05-01: qty 3

## 2013-05-01 MED ORDER — DEXTROSE 5 % IV SOLN
1.0000 g | Freq: Once | INTRAVENOUS | Status: AC
  Administered 2013-05-02: 1 g via INTRAVENOUS
  Filled 2013-05-01: qty 10

## 2013-05-01 MED ORDER — IPRATROPIUM BROMIDE 0.02 % IN SOLN
0.5000 mg | Freq: Once | RESPIRATORY_TRACT | Status: AC
  Administered 2013-05-01: 0.5 mg via RESPIRATORY_TRACT
  Filled 2013-05-01: qty 2.5

## 2013-05-01 MED ORDER — LEVOFLOXACIN IN D5W 500 MG/100ML IV SOLN: 500.0000 mg | Freq: Once | INTRAVENOUS | Status: DC

## 2013-05-01 MED ORDER — DEXTROSE 5 % IV SOLN
500.0000 mg | Freq: Once | INTRAVENOUS | Status: AC
  Administered 2013-05-02: 500 mg via INTRAVENOUS
  Filled 2013-05-01: qty 500

## 2013-05-01 MED ORDER — ATENOLOL 50 MG PO TABS
50.0000 mg | ORAL_TABLET | Freq: Once | ORAL | Status: AC
  Administered 2013-05-01: 50 mg via ORAL
  Filled 2013-05-01: qty 1

## 2013-05-01 NOTE — ED Notes (Signed)
Bed: NW29 Expected date:  Expected time:  Means of arrival:  Comments: Margo Aye a

## 2013-05-01 NOTE — ED Provider Notes (Signed)
CSN: [id number]     Arrival date & time 05/01/13  1640 History   First MD Initiated Contact with Patient 05/01/13 1646     Chief Complaint  Patient presents with  . Asthma   (Consider location/radiation/quality/duration/timing/severity/associated sxs/prior Treatment) HPI Comments: Patient is employed at [employer] hospital. She had an asthma attack while leaving work. She was treated at the MAU with nebulizers and sent her. She reports she hasn't had problems with asthma since she was a child. She endorses a dry cough today. Denies any chest pain, abdominal pain, nausea or vomiting. She does not smoke. She reports her breathing problems last week when she was exposed to a cleaning product. She is not sure what she was exposed to today. She's feeling better. No leg pain or swelling. She's never been admitted for asthma.  The history is provided by the patient and the EMS personnel.    Past Medical History  Diagnosis Date  . Hypertension   . Diabetes mellitus    Past Surgical History  Procedure Laterality Date  . Cholecystectomy    . Cesarean section     Family History  Problem Relation Age of Onset  . Hypertension Mother   . Hypertension Father    History  Substance Use Topics  . Smoking status: Never Smoker   . Smokeless tobacco: Not on file  . Alcohol Use: No   OB History   Grav Para Term Preterm Abortions TAB SAB Ect Mult Living                 Review of Systems  Constitutional: Negative for fever, activity change and appetite change.  HENT: Negative for congestion and rhinorrhea.   Respiratory: Positive for cough, chest tightness and shortness of breath.   Cardiovascular: Negative for chest pain.  Gastrointestinal: Negative for nausea, vomiting and abdominal pain.  Genitourinary: Negative for dysuria, hematuria, vaginal bleeding and vaginal discharge.  Musculoskeletal: Negative for back pain.  Skin: Negative for rash.  Neurological: Negative for dizziness, weakness and  headaches.  A complete 10 system review of systems was obtained and all systems are negative except as noted in the HPI and PMH.    Allergies  Orange  Home Medications   Current Outpatient Rx  Name  Route  Sig  Dispense  Refill  . enalapril (VASOTEC) 10 MG tablet   Oral   Take 5 mg by mouth daily.         . naproxen sodium (ANAPROX) 220 MG tablet   Oral   Take 440 mg by mouth 2 (two) times daily with a meal.         . atenolol (TENORMIN) 50 MG tablet   Oral   Take 1 tablet (50 mg total) by mouth once.   30 tablet   0   . azithromycin (ZITHROMAX Z-PAK) 250 MG tablet      2 tablet PO on day one, then 1 tablet PO daily   6 each   0   . cefUROXime (CEFTIN) 500 MG tablet   Oral   Take 1 tablet (500 mg total) by mouth 2 (two) times daily.   20 tablet   0   . methimazole (TAPAZOLE) 10 MG tablet   Oral   Take 2 tablets (20 mg total) by mouth once.   60 tablet   0    BP 152/91  Pulse 100  Temp(Src) 98.5 F (36.9 C) (Oral)  Resp 22  SpO2 95%  LMP 04/08/2013 Physical Exam  Constitutional: She is oriented to person, place, and time. She appears well-developed and well-nourished. No distress.  HENT:  Head: Normocephalic and atraumatic.  Mouth/Throat: Oropharynx is clear and moist. No oropharyngeal exudate.  Eyes: EOM are normal. Pupils are equal, round, and reactive to light.  Neck: Normal range of motion. Neck supple.  Cardiovascular: Normal rate, regular rhythm and normal heart sounds.   Tachycardic  Pulmonary/Chest: Effort normal and breath sounds normal. No respiratory distress.  Good air exchange bilaterally, no wheezing  Abdominal: Soft.  Musculoskeletal: Normal range of motion. She exhibits no edema and no tenderness.  Neurological: She is alert and oriented to person, place, and time. No cranial nerve deficit. She exhibits normal muscle tone. Coordination normal.  Skin: Skin is warm.    ED Course  Procedures (including critical care time) Labs  Review Labs Reviewed  BASIC METABOLIC PANEL - Abnormal; Notable for the following:    Glucose, Bld 271 (*)    Creatinine, Ser 0.49 (*)    All other components within normal limits  D-DIMER, QUANTITATIVE - Abnormal; Notable for the following:    D-Dimer, Quant 1.30 (*)    All other components within normal limits  TROPONIN I  TSH  T4, FREE  PREGNANCY, URINE  URINALYSIS, ROUTINE W REFLEX MICROSCOPIC   Imaging Review Dg Chest 2 View  05/01/2013   CLINICAL DATA:  Shortness of Breath  EXAM: CHEST  2 VIEW  COMPARISON:  August 17, 2012  IMPRESSION: Scarring left mid lower lung zones. No edema or consolidation. Slight cardiac enlargement.  FINDINGS: There is mild scarring in the left mid and lower lung zones. There is no frank edema or consolidation. Heart is mildly enlarged with normal pulmonary vascularity. No adenopathy. No bone lesions. .   Electronically Signed   By: Bretta Bang   On: 05/01/2013 18:19   Ct Angio Chest Pe W/cm &/or Wo Cm  05/01/2013   CLINICAL DATA:  Elevated D-dimer. Shortness of breath. Chest pain. Tachycardia.  EXAM: CT ANGIOGRAPHY CHEST WITH CONTRAST  TECHNIQUE: Multidetector CT imaging of the chest was performed using the standard protocol during bolus administration of intravenous contrast. Multiplanar CT image reconstructions including MIPs were obtained to evaluate the vascular anatomy.  CONTRAST:  OMNIPAQUE IOHEXOL 350 MG/ML SOLN  COMPARISON:  Chest CT 08/17/2012.  FINDINGS: Mediastinum: No filling defects within the pulmonary arterial tree to suggest underlying pulmonary embolism. Heart size is mildly enlarged. Soft tissue in the anterior mediastinum, favored to represent residual thymic tissue. There is no significant pericardial fluid, thickening or pericardial calcification. No pathologically enlarged mediastinal or hilar lymph nodes. Diffuse enlargement of the thyroid gland. Esophagus is unremarkable in appearance.  Lungs/Pleura: Extensive bilateral lower  lobe peribronchovascular ground-glass attenuation airspace disease (right greater than left), sequela for either scratchy concerning for sequela of aspiration or developing multilobar bronchopneumonia. Small right and trace left pleural effusions layer dependently.  Upper Abdomen: Unremarkable.  Musculoskeletal: There are no aggressive appearing lytic or blastic lesions noted in the visualized portions of the skeleton.  Review of the MIP images confirms the above findings.  IMPRESSION: 1. No evidence of pulmonary embolism. However, there is bilateral lower lobe peribronchovascular airspace disease concerning for sequela of aspiration or developing multilobar bronchopneumonia. 2. Small right and trace left pleural effusions. 3. Mild cardiomegaly. 4. Diffuse enlargement of the thyroid gland may suggest a goiter. No focal lesions are identified. These findings are nonspecific.   Electronically Signed   By: Trudie Reed M.D.   On: 05/01/2013  23:42    MDM   1. CAP (community acquired pneumonia)   2. Hyperthyroidism    Apparent asthma exacerbation, now resolved after nebulizers. No chest pain. Patient is tachycardic. No hypoxia. Patient with no difficulty breathing on arrival to the ED. With sudden onset and no history of asthma as a problem since childhood, there is concern for other etiologies of dyspnea.   patient reports a history of hyperthyroidism and has been out of her methimazole for several months.  Patient's IV infiltrated during CT scan images were not being able to be obtained.  multiple nurses attempted additional IV access without success. I attempted an ultrasound guided IV which is not successful. She now refuses further attempts./She understands that a pulmonary embolism has not been ruled out and she is still at risk and this can be a life-threatening condition. She has capacity to make this decision.  Suspect her ongoing tachycardia secondary to hyperthyroidism. Will restart her beta  blocker and methimazole. Patient was willing to allow another IV start which was successful.  CT scan is negative for PE. However it does show bilateral basilar airspace disease concerning for pneumonia. This is similar in appearance to what she had last January. She denies any chest pain or shortness of breath. She is offered observation admission but wishes to go home. She is able to ambulate at maintain her oxygen saturations at 97%.  No distress, lungs clear.  Return precautions discussed.  Will give ceftin, azithromycin, refill methimazole and atenolol. Follow up with wellness center.  Date: 05/01/2013  Rate: 124  Rhythm: sinus tachycardia  QRS Axis: normal  Intervals: normal  ST/T Wave abnormalities: normal  Conduction Disutrbances:none  Narrative Interpretation:   Old EKG Reviewed: changes noted  BP 152/91  Pulse 100  Temp(Src) 98.5 F (36.9 C) (Oral)  Resp 22  SpO2 95%  LMP 04/08/2013   Glynn Octave, MD 05/02/13 1610

## 2013-05-01 NOTE — ED Notes (Signed)
MD to bedside to attempt bedside ultrasound for IV start. Unable. IV team paged at this time

## 2013-05-01 NOTE — ED Notes (Signed)
Patient agrees to try for IV start again. Charge nurse at bedside

## 2013-05-01 NOTE — MAU Note (Signed)
Pt found in the employee parking lot after a phone call from her husband. Asthma treatments administered, while waiting for EMS. BP. 231/91 16:04 O2 sat 100%. Respiratory therapist and PA  At bedside.

## 2013-05-01 NOTE — ED Notes (Signed)
Bed: [facility] Expected date:  Expected time:  Means of arrival:  Comments: asthma

## 2013-05-01 NOTE — ED Notes (Signed)
Patient has returned from CT. CT tech reports infiltration of IV in Left AC. Pt does not want to be stuck again at this time. Dr. Manus Gunning made aware of the same and reports he will go and speak with the patient

## 2013-05-01 NOTE — MAU Provider Note (Signed)
History     CSN: [id number]  Arrival date and time: 05/01/13 1559   None     No chief complaint on file.  HPI Rhonda Baldwin is a 32 y.o. female who presents to MAU today with acute asthma attack. Patient is an employee of [employer] and began feeling short of breath. Patient is a known asthmatic. 9-1-1 was called prior to bringing patient into MAU. Patient does not have a rescue inhaler for home use.    OB History   Grav Para Term Preterm Abortions TAB SAB Ect Mult Living                  Past Medical History  Diagnosis Date  . Hypertension   . Diabetes mellitus     Past Surgical History  Procedure Laterality Date  . Cholecystectomy    . Cesarean section      Family History  Problem Relation Age of Onset  . Hypertension Mother   . Hypertension Father     History  Substance Use Topics  . Smoking status: Never Smoker   . Smokeless tobacco: Not on file  . Alcohol Use: No    Allergies:  Allergies  Allergen Reactions  . Orange Swelling    Prescriptions prior to admission  Medication Sig Dispense Refill  . acetaminophen (TYLENOL) 500 MG tablet Take 1,500 mg by mouth every 6 (six) hours as needed. For headache.      Marland Kitchen atenolol (TENORMIN) 50 MG tablet Take 1 tablet (50 mg total) by mouth daily.  34 tablet  0  . azithromycin (ZITHROMAX) 250 MG tablet Take 1 tab daily for 6 days  6 each  0  . cefUROXime (CEFTIN) 500 MG tablet Take 1 tablet (500 mg total) by mouth 2 (two) times daily. For 6 days  12 tablet  0  . metFORMIN (GLUCOPHAGE) 500 MG tablet Take 1 tablet (500 mg total) by mouth 2 (two) times daily with a meal.  60 tablet  1  . methimazole (TAPAZOLE) 10 MG tablet Take 2 tablets (20 mg total) by mouth daily.  68 tablet  0  . omeprazole (PRILOSEC) 20 MG capsule Take 1 capsule (20 mg total) by mouth daily.  30 capsule  0  . ondansetron (ZOFRAN ODT) 8 MG disintegrating tablet 8mg  ODT q8 hours prn nausea  4 tablet  0    Review of Systems  Respiratory: Positive for  cough, shortness of breath and wheezing.    Physical Exam   There were no vitals taken for this visit.  Physical Exam  Constitutional: She is oriented to person, place, and time. She appears well-developed and well-nourished. She appears distressed.  HENT:  Head: Normocephalic and atraumatic.  Cardiovascular: Tachycardia present.   Respiratory: She is in respiratory distress. She has rhonchi in the right upper field, the right middle field, the right lower field, the left upper field, the left middle field and the left lower field.  Neurological: She is alert and oriented to person, place, and time.  Skin: Skin is warm and dry. No erythema.    MAU Course  Procedures None  MDM Respiratory therapy called immediately upon arrival in MAU Albuterol x 2 and Atrovent x 1 given  Patient has known HTN - BP in MAU is 231/91 and 214/106 O2 sat is 100% Diffuse Rhales noted on auscultation prior to administration of medications Significant improvement in air movement following medication administration, continued diffuse rhales, tachypnea is resolved EMS arrived in [facility]U for transfer to  [facility] Called [facility] and report given to Dr. Micheline Maze. She is aware that patient will arrive soon by EMS and what medications she has been given prior to leaving MAU as well as issues with HTN  Assessment and Plan  A: Acute asthma attack HTN  P: Transfer to [facility] for further evaluation and management   Freddi Starr, PA-C  05/01/2013, 4:26 PM

## 2013-05-01 NOTE — ED Notes (Signed)
Per EMS:  Pt works at [employer].  Was leaving work, she was on the phone with her husband walking to her car and told him that she couldn't breathe.  Husband called MAU, they went out to get her and take her back inside.  Hx of asthma and HTN.  Pt states she has had a cough today.  Had 10 mg albuterol and 0.5 of atrovent at MAU.  Clear and equal now.  Pt very hypertensive/tachy en route.

## 2013-05-02 LAB — URINE MICROSCOPIC-ADD ON

## 2013-05-02 LAB — URINALYSIS, ROUTINE W REFLEX MICROSCOPIC
Bilirubin Urine: NEGATIVE
Glucose, UA: >1000 mg/dL — AB
Ketones, ur: NEGATIVE mg/dL
Leukocytes, UA: NEGATIVE
Nitrite: NEGATIVE
Protein, ur: NEGATIVE mg/dL
Specific Gravity, Urine: >1.046 — ABNORMAL HIGH (ref 1.005–1.030)
Urobilinogen, UA: 1 mg/dL (ref 0.0–1.0)
pH: 6 (ref 5.0–8.0)

## 2013-05-02 LAB — T4, FREE: Free T4: 6.22 ng/dL — ABNORMAL HIGH (ref 0.80–1.80)

## 2013-05-02 LAB — TSH: TSH: <0.008 u[IU]/mL — ABNORMAL LOW (ref 0.350–4.500)

## 2013-05-02 LAB — PREGNANCY, URINE: Preg Test, Ur: NEGATIVE

## 2013-05-02 MED ORDER — CEFUROXIME AXETIL 500 MG PO TABS: 500.0000 mg | ORAL_TABLET | Freq: Two times a day (BID) | ORAL | Status: DC

## 2013-05-02 MED ORDER — ATENOLOL 50 MG PO TABS: 50.0000 mg | ORAL_TABLET | Freq: Once | ORAL | Status: DC

## 2013-05-02 MED ORDER — AZITHROMYCIN 250 MG PO TABS: ORAL_TABLET | ORAL | Status: DC

## 2013-05-02 MED ORDER — METHIMAZOLE 10 MG PO TABS: 20.0000 mg | ORAL_TABLET | Freq: Once | ORAL | Status: DC

## 2013-05-02 MED ORDER — ONDANSETRON HCL 4 MG PO TABS
4.0000 mg | ORAL_TABLET | Freq: Once | ORAL | Status: AC
  Administered 2013-05-02: 4 mg via ORAL
  Filled 2013-05-02: qty 1

## 2013-05-02 NOTE — MAU Provider Note (Signed)
Attestation of Attending Supervision of Advanced Practitioner (PA/CNM/NP): Evaluation and management procedures were performed by the Advanced Practitioner under my supervision and collaboration.  I have reviewed the Advanced Practitioner's note and chart, and I agree with the management and plan.  Tylerjames Hoglund, MD, FACOG Attending Obstetrician & Gynecologist Faculty Practice, [facility]  

## 2013-05-02 NOTE — ED Notes (Signed)
Pt ambulated and 02 stayed at 97%.

## 2013-05-03 ENCOUNTER — Ambulatory Visit: Payer: 59 | Attending: Internal Medicine | Admitting: Internal Medicine

## 2013-05-03 VITALS — BP 175/114 | HR 111 | Temp 98.2°F | Resp 16 | Ht 63.78 in | Wt 233.0 lb

## 2013-05-03 DIAGNOSIS — I10 Essential (primary) hypertension: Secondary | ICD-10-CM | POA: Insufficient documentation

## 2013-05-03 DIAGNOSIS — E669 Obesity, unspecified: Secondary | ICD-10-CM | POA: Insufficient documentation

## 2013-05-03 DIAGNOSIS — J45909 Unspecified asthma, uncomplicated: Secondary | ICD-10-CM | POA: Insufficient documentation

## 2013-05-03 DIAGNOSIS — E119 Type 2 diabetes mellitus without complications: Secondary | ICD-10-CM | POA: Insufficient documentation

## 2013-05-03 DIAGNOSIS — E0590 Thyrotoxicosis, unspecified without thyrotoxic crisis or storm: Secondary | ICD-10-CM

## 2013-05-03 DIAGNOSIS — IMO0001 Reserved for inherently not codable concepts without codable children: Secondary | ICD-10-CM

## 2013-05-03 MED ORDER — ATENOLOL 50 MG PO TABS: 50.0000 mg | ORAL_TABLET | Freq: Once | ORAL | Status: DC

## 2013-05-03 MED ORDER — LISINOPRIL-HYDROCHLOROTHIAZIDE 20-12.5 MG PO TABS: 1.0000 | ORAL_TABLET | Freq: Every day | ORAL | Status: DC

## 2013-05-03 MED ORDER — METHIMAZOLE 10 MG PO TABS: 20.0000 mg | ORAL_TABLET | Freq: Once | ORAL | Status: DC

## 2013-05-03 NOTE — Progress Notes (Signed)
Patient ID: Rhonda Baldwin, female   DOB: 18-Dec-1980, 32 y.o.   MRN: [id number] PCP:  Jeanann Lewandowsky, MD    Chief Complaint:  Establish care  HPI: 32 year old female with history of hypertension, hypothyroidism that was diagnosed early this year when she was admitted to hospital for pneumonia, obesity who was seen in the ED 2 days back for asthma-like symptoms with a CT scan of her chest done to rule out PE showed multilobar bronchopneumonia. He was discharged on Ceftin and azithromycin. She feels her symptoms have improved with antibiotics. She has not taken her methimazole and blood pressure medications for past 6 months as she did not have her insurance. She denies any headache, blurred vision, diaphoresis, dizziness, palpitations, chest pain, but this of breath, abdominal pain, nausea, vomiting, bowel or urinary symptoms. She denies any change in her weight or appetite. He has noticed that her neck appears to be more swollen denies any difficulty swallowing, hoarseness of voice or change in her voice. Denies any symptoms of depression, change in her mood or confusion.  Allergies: Allergies  Allergen Reactions  . Orange Swelling    oranges    Prior to Admission medications   Medication Sig Start Date End Date Taking? Authorizing Provider  atenolol (TENORMIN) 50 MG tablet Take 1 tablet (50 mg total) by mouth once. 05/03/13  Yes Elgar Scoggins, MD  azithromycin (ZITHROMAX Z-PAK) 250 MG tablet 2 tablet PO on day one, then 1 tablet PO daily 05/02/13  Yes Glynn Octave, MD  cefUROXime (CEFTIN) 500 MG tablet Take 1 tablet (500 mg total) by mouth 2 (two) times daily. 05/02/13  Yes Glynn Octave, MD  methimazole (TAPAZOLE) 10 MG tablet Take 2 tablets (20 mg total) by mouth once. 05/03/13  Yes Yavier Snider, MD  naproxen sodium (ANAPROX) 220 MG tablet Take 440 mg by mouth 2 (two) times daily with a meal.   Yes Historical Provider, MD  lisinopril-hydrochlorothiazide (ZESTORETIC) 20-12.5 MG per  tablet Take 1 tablet by mouth daily. 05/03/13   Eddie North, MD    Past Medical History  Diagnosis Date  . Hypertension   . Diabetes mellitus     Past Surgical History  Procedure Laterality Date  . Cholecystectomy    . Cesarean section      Social History:  reports that she has never smoked. She does not have any smokeless tobacco history on file. She reports that she does not drink alcohol or use illicit drugs.  Family History  Problem Relation Age of Onset  . Hypertension Mother   . Hypertension Father     Review of Systems:  As outlined in history of present illness   Physical Exam:  Filed Vitals:   05/03/13 0913  BP: 175/114  Pulse: 111  Temp: 98.2 F (36.8 C)  TempSrc: Oral  Resp: 16  Height: 5' 3.78" (1.62 m)  Weight: 233 lb (105.688 kg)  SpO2: 98%    Constitutional: Vital signs reviewed. Patient is an obese female in no acute distress HEENT: No pallor, no icterus, moist oral mucosa, enlarged thyroid palpable, nontender, no bruit. No cervical adenopathy Cardiovascular: RRR, S1 normal, S2 normal,  Pulmonary/Chest: CTAB, no wheezes, rales, or rhonchi Abdominal: Soft. Non-tender, non-distended, bowel sounds are normal, no masses, organomegaly, or guarding present.  Musculoskeletal: No joint deformities, erythema, or stiffness, ROM full and no nontender  Neurological: A&O x3, nonfocal Labs on Admission:  Results for orders placed during the hospital encounter of 05/01/13 (from the past 48 hour(s))  BASIC METABOLIC PANEL  Status: Abnormal   Collection Time    05/01/13  5:15 PM      Result Value Range   Sodium 136  135 - 145 mEq/L   Potassium 3.9  3.5 - 5.1 mEq/L   Chloride 103  96 - 112 mEq/L   CO2 21  19 - 32 mEq/L   Glucose, Bld 271 (*) 70 - 99 mg/dL   BUN 17  6 - 23 mg/dL   Creatinine, Ser 9.14 (*) 0.50 - 1.10 mg/dL   Calcium 9.3  8.4 - 78.2 mg/dL   GFR calc non Af Amer >90  >90 mL/min   GFR calc Af Amer >90  >90 mL/min   Comment: (NOTE)      The eGFR has been calculated using the CKD EPI equation.     This calculation has not been validated in all clinical situations.     eGFR's persistently <90 mL/min signify possible Chronic Kidney     Disease.  TROPONIN I     Status: None   Collection Time    05/01/13  5:15 PM      Result Value Range   Troponin I <0.30  <0.30 ng/mL   Comment:            Due to the release kinetics of cTnI,     a negative result within the first hours     of the onset of symptoms does not rule out     myocardial infarction with certainty.     If myocardial infarction is still suspected,     repeat the test at appropriate intervals.  D-DIMER, QUANTITATIVE     Status: Abnormal   Collection Time    05/01/13  5:15 PM      Result Value Range   D-Dimer, Quant 1.30 (*) 0.00 - 0.48 ug/mL-FEU   Comment:            AT THE INHOUSE ESTABLISHED CUTOFF     VALUE OF 0.48 ug/mL FEU,     THIS ASSAY HAS BEEN DOCUMENTED     IN THE LITERATURE TO HAVE     A SENSITIVITY AND NEGATIVE     PREDICTIVE VALUE OF AT LEAST     98 TO 99%.  THE TEST RESULT     SHOULD BE CORRELATED WITH     AN ASSESSMENT OF THE CLINICAL     PROBABILITY OF DVT / VTE.  TSH     Status: Abnormal   Collection Time    05/01/13  5:40 PM      Result Value Range   TSH <0.008 (*) 0.350 - 4.500 uIU/mL   Comment: Performed at [facility]  T4, FREE     Status: Abnormal   Collection Time    05/01/13  5:40 PM      Result Value Range   Free T4 6.22 (*) 0.80 - 1.80 ng/dL   Comment: Performed at [facility]  PREGNANCY, URINE     Status: None   Collection Time    05/02/13 12:49 AM      Result Value Range   Preg Test, Ur NEGATIVE  NEGATIVE   Comment:            THE SENSITIVITY OF THIS     METHODOLOGY IS >20 mIU/mL.  URINALYSIS, ROUTINE W REFLEX MICROSCOPIC     Status: Abnormal   Collection Time    05/02/13 12:49 AM      Result Value Range   Color, Urine YELLOW  YELLOW   APPearance CLOUDY (*) CLEAR   Specific Gravity, Urine  >1.046 (*) 1.005 - 1.030   pH 6.0  5.0 - 8.0   Glucose, UA >1000 (*) NEGATIVE mg/dL   Hgb urine dipstick TRACE (*) NEGATIVE   Bilirubin Urine NEGATIVE  NEGATIVE   Ketones, ur NEGATIVE  NEGATIVE mg/dL   Protein, ur NEGATIVE  NEGATIVE mg/dL   Urobilinogen, UA 1.0  0.0 - 1.0 mg/dL   Nitrite NEGATIVE  NEGATIVE   Leukocytes, UA NEGATIVE  NEGATIVE  URINE MICROSCOPIC-ADD ON     Status: Abnormal   Collection Time    05/02/13 12:49 AM      Result Value Range   Squamous Epithelial / LPF MANY (*) RARE   WBC, UA 0-2  <3 WBC/hpf   Bacteria, UA RARE  RARE    Radiological Exams on Admission: Dg Chest 2 View  05/01/2013   CLINICAL DATA:  Shortness of Breath  EXAM: CHEST  2 VIEW  COMPARISON:  August 17, 2012  IMPRESSION: Scarring left mid lower lung zones. No edema or consolidation. Slight cardiac enlargement.  FINDINGS: There is mild scarring in the left mid and lower lung zones. There is no frank edema or consolidation. Heart is mildly enlarged with normal pulmonary vascularity. No adenopathy. No bone lesions. .   Electronically Signed   By: Bretta Bang   On: 05/01/2013 18:19   Ct Angio Chest Pe W/cm &/or Wo Cm  05/01/2013   CLINICAL DATA:  Elevated D-dimer. Shortness of breath. Chest pain. Tachycardia.  EXAM: CT ANGIOGRAPHY CHEST WITH CONTRAST  TECHNIQUE: Multidetector CT imaging of the chest was performed using the standard protocol during bolus administration of intravenous contrast. Multiplanar CT image reconstructions including MIPs were obtained to evaluate the vascular anatomy.  CONTRAST:  OMNIPAQUE IOHEXOL 350 MG/ML SOLN  COMPARISON:  Chest CT 08/17/2012.  FINDINGS: Mediastinum: No filling defects within the pulmonary arterial tree to suggest underlying pulmonary embolism. Heart size is mildly enlarged. Soft tissue in the anterior mediastinum, favored to represent residual thymic tissue. There is no significant pericardial fluid, thickening or pericardial calcification. No pathologically  enlarged mediastinal or hilar lymph nodes. Diffuse enlargement of the thyroid gland. Esophagus is unremarkable in appearance.  Lungs/Pleura: Extensive bilateral lower lobe peribronchovascular ground-glass attenuation airspace disease (right greater than left), sequela for either scratchy concerning for sequela of aspiration or developing multilobar bronchopneumonia. Small right and trace left pleural effusions layer dependently.  Upper Abdomen: Unremarkable.  Musculoskeletal: There are no aggressive appearing lytic or blastic lesions noted in the visualized portions of the skeleton.  Review of the MIP images confirms the above findings.  IMPRESSION: 1. No evidence of pulmonary embolism. However, there is bilateral lower lobe peribronchovascular airspace disease concerning for sequela of aspiration or developing multilobar bronchopneumonia. 2. Small right and trace left pleural effusions. 3. Mild cardiomegaly. 4. Diffuse enlargement of the thyroid gland may suggest a goiter. No focal lesions are identified. These findings are nonspecific.   Electronically Signed   By: Trudie Reed M.D.   On: 05/01/2013 23:42    Assessment/Plan  hyperthyroidism  refilled methimazole . Has not taken meds for 6 mths. Check US neck as she feels her thyroid gland has enlarged in size.  Check CBC  CAP  on abx and clinically improving.  uncontrolled HTN  will refill atenolol. Start on HCTZ-lisinopril. Check BMET. F/up for BP monitoring early next week. counseled on salt restriction in diet and exercise.    DM  not on any  meds. Was on metformin previously. Check A1C. Will address on her medication regimen on next visit. Check lipid panel  Obesity  counseled on   diet and exercise to lose weight  Health maintenance  refer to women's health for PAP   Followup in 2 weeks for blood pressure monitoring, follow up on results of thyroid ultrasound, A1c results  Devory Mckinzie 05/03/2013, 10:21 AM

## 2013-05-03 NOTE — Progress Notes (Signed)
Pt is here to establish care HFU Pt was admitted to the hospital on Wednesday with an asthma attack. She said that she has not had an attack since she was a kid. Pt also reports that she has HTN and is a diabetic

## 2013-05-06 ENCOUNTER — Ambulatory Visit: Payer: 59 | Attending: Family Medicine

## 2013-05-06 DIAGNOSIS — E669 Obesity, unspecified: Secondary | ICD-10-CM | POA: Insufficient documentation

## 2013-05-06 LAB — LIPID PANEL
Cholesterol: 116 mg/dL (ref 0–200)
HDL: 34 mg/dL — ABNORMAL LOW (ref >39)
LDL Cholesterol: 73 mg/dL (ref 0–99)
Total CHOL/HDL Ratio: 3.4 Ratio
Triglycerides: 45 mg/dL (ref <150)
VLDL: 9 mg/dL (ref 0–40)

## 2013-05-06 LAB — CBC
HCT: 34.1 % — ABNORMAL LOW (ref 36.0–46.0)
Hemoglobin: 11.5 g/dL — ABNORMAL LOW (ref 12.0–15.0)
MCH: 25.6 pg — ABNORMAL LOW (ref 26.0–34.0)
MCHC: 33.7 g/dL (ref 30.0–36.0)
MCV: 75.8 fL — ABNORMAL LOW (ref 78.0–100.0)
Platelets: 162 10*3/uL (ref 150–400)
RBC: 4.5 MIL/uL (ref 3.87–5.11)
RDW: 14.7 % (ref 11.5–15.5)
WBC: 4.1 10*3/uL (ref 4.0–10.5)

## 2013-05-06 LAB — HEMOGLOBIN A1C
Hgb A1c MFr Bld: 8.8 % — ABNORMAL HIGH (ref <5.7)
Mean Plasma Glucose: 206 mg/dL — ABNORMAL HIGH (ref <117)

## 2013-05-06 LAB — BASIC METABOLIC PANEL
BUN: 8 mg/dL (ref 6–23)
CO2: 22 mEq/L (ref 19–32)
Calcium: 9.6 mg/dL (ref 8.4–10.5)
Chloride: 107 mEq/L (ref 96–112)
Creat: <0.3 mg/dL — ABNORMAL LOW (ref 0.50–1.10)
Glucose, Bld: 98 mg/dL (ref 70–99)
Potassium: 3.8 mEq/L (ref 3.5–5.3)
Sodium: 138 mEq/L (ref 135–145)

## 2013-05-07 ENCOUNTER — Encounter: Payer: Self-pay | Admitting: Obstetrics & Gynecology

## 2013-05-09 ENCOUNTER — Encounter: Payer: 59 | Admitting: Obstetrics & Gynecology

## 2013-05-10 ENCOUNTER — Ambulatory Visit (HOSPITAL_COMMUNITY)
Admission: RE | Admit: 2013-05-10 | Discharge: 2013-05-10 | Disposition: A | Discharge status: Home / Self Care | Payer: 59 | Source: Ambulatory Visit | Attending: Internal Medicine | Admitting: Internal Medicine

## 2013-05-10 DIAGNOSIS — E0500 Thyrotoxicosis with diffuse goiter without thyrotoxic crisis or storm: Secondary | ICD-10-CM | POA: Insufficient documentation

## 2013-05-10 DIAGNOSIS — E669 Obesity, unspecified: Secondary | ICD-10-CM

## 2013-05-16 ENCOUNTER — Ambulatory Visit: Payer: 59 | Attending: Internal Medicine | Admitting: Internal Medicine

## 2013-05-16 ENCOUNTER — Encounter: Payer: Self-pay | Admitting: Internal Medicine

## 2013-05-16 VITALS — BP 165/114 | HR 91 | Temp 98.0°F | Resp 16 | Ht 63.0 in | Wt 227.0 lb

## 2013-05-16 DIAGNOSIS — J189 Pneumonia, unspecified organism: Secondary | ICD-10-CM

## 2013-05-16 DIAGNOSIS — Z23 Encounter for immunization: Secondary | ICD-10-CM

## 2013-05-16 DIAGNOSIS — E119 Type 2 diabetes mellitus without complications: Secondary | ICD-10-CM

## 2013-05-16 DIAGNOSIS — I10 Essential (primary) hypertension: Secondary | ICD-10-CM | POA: Insufficient documentation

## 2013-05-16 DIAGNOSIS — E0590 Thyrotoxicosis, unspecified without thyrotoxic crisis or storm: Secondary | ICD-10-CM | POA: Insufficient documentation

## 2013-05-16 MED ORDER — METFORMIN HCL 500 MG PO TABS: 500.0000 mg | ORAL_TABLET | Freq: Two times a day (BID) | ORAL | Status: DC

## 2013-05-16 MED ORDER — METHIMAZOLE 10 MG PO TABS: 20.0000 mg | ORAL_TABLET | Freq: Every day | ORAL | Status: DC

## 2013-05-16 MED ORDER — LISINOPRIL-HYDROCHLOROTHIAZIDE 20-12.5 MG PO TABS: 1.0000 | ORAL_TABLET | Freq: Every day | ORAL | Status: DC

## 2013-05-16 MED ORDER — ATENOLOL 50 MG PO TABS: 50.0000 mg | ORAL_TABLET | Freq: Every day | ORAL | Status: DC

## 2013-05-16 NOTE — Progress Notes (Signed)
Patient ID: Rhonda Baldwin, female   DOB: Dec 15, 1980, 32 y.o.   MRN: [id number] Patient Demographics  Rhonda Baldwin, is a 31 y.o. female  WUJ:[id number]  [id number]  DOB - 01/12/1981  Chief Complaint  Patient presents with  . Follow-up        Subjective:   Rhonda Baldwin today is here for a follow up visit. Patient has No headache, No chest pain, No abdominal pain - No Nausea, No new weakness tingling or numbness, No Cough - Still feels somewhat short of breath. BP is 165/114, patient was supposed to be on lisinopril/HCTZ however she reports that she did not know about that and was only taking atenolol. Wants paperwork filled out for her job for short-term disability, returning back to job on 06/09/2013.  Objective:    Filed Vitals:   05/16/13 1105  BP: 165/114  Pulse: 91  Temp: 98 F (36.7 C)  TempSrc: Oral  Resp: 16  Height: 5\' 3"  (1.6 m)  Weight: 227 lb (102.967 kg)  SpO2: 98%     ALLERGIES:   Allergies  Allergen Reactions  . Orange Swelling    oranges    PAST MEDICAL HISTORY: Past Medical History  Diagnosis Date  . Hypertension   . Diabetes mellitus     MEDICATIONS AT HOME: Prior to Admission medications   Medication Sig Start Date End Date Taking? Authorizing Provider  atenolol (TENORMIN) 50 MG tablet Take 1 tablet (50 mg total) by mouth daily. 05/16/13  Yes Bailee Metter Jenna Luo, MD  lisinopril-hydrochlorothiazide (ZESTORETIC) 20-12.5 MG per tablet Take 1 tablet by mouth daily. 05/16/13  Yes Lindsay Straka Jenna Luo, MD  methimazole (TAPAZOLE) 10 MG tablet Take 2 tablets (20 mg total) by mouth daily. 05/16/13  Yes Myrl Lazarus Jenna Luo, MD  naproxen sodium (ANAPROX) 220 MG tablet Take 440 mg by mouth 2 (two) times daily with a meal.   Yes Historical Provider, MD  azithromycin (ZITHROMAX Z-PAK) 250 MG tablet 2 tablet PO on day one, then 1 tablet PO daily 05/02/13   Glynn Octave, MD  cefUROXime (CEFTIN) 500 MG tablet Take 1 tablet (500 mg total) by mouth 2 (two) times daily.  05/02/13   Glynn Octave, MD  metFORMIN (GLUCOPHAGE) 500 MG tablet Take 1 tablet (500 mg total) by mouth 2 (two) times daily with a meal. 05/16/13   Jaziel Bennett Jenna Luo, MD     Exam  General appearance :Awake, alert, NAD, Speech Clear.  HEENT: Atraumatic and Normocephalic, PERLA Neck: supple, no JVD. No cervical lymphadenopathy.  Chest: Clear to auscultation bilaterally, no wheezing, rales or rhonchi CVS: S1 S2 regular, no murmurs.  Abdomen: soft, NBS, NT, ND, no gaurding, rigidity or rebound. Extremities: no cyanosis or clubbing, B/L Lower Ext shows no edema Neurology: Awake alert, and oriented X 3, CN II-XII intact, Non focal Skin: No Rash or lesions Wounds:N/A    Data Review   Basic Metabolic Panel: No results found for this basename: NA, K, CL, CO2, GLUCOSE, BUN, CREATININE, CALCIUM, MG, PHOS,  in the last 168 hours Liver Function Tests: No results found for this basename: AST, ALT, ALKPHOS, BILITOT, PROT, ALBUMIN,  in the last 168 hours  CBC: No results found for this basename: WBC, NEUTROABS, HGB, HCT, MCV, PLT,  in the last 168 hours  ------------------------------------------------------------------------------------------------------------------ No results found for this basename: HGBA1C,  in the last 72 hours ------------------------------------------------------------------------------------------------------------------ No results found for this basename: CHOL, HDL, LDLCALC, TRIG, CHOLHDL, LDLDIRECT,  in the last 72 hours ------------------------------------------------------------------------------------------------------------------ No results found for this basename:  TSH, T4TOTAL, FREET3, T3FREE, THYROIDAB,  in the last 72 hours ------------------------------------------------------------------------------------------------------------------ No results found for this basename: VITAMINB12, FOLATE, FERRITIN, TIBC, IRON, RETICCTPCT,  in the last 72 hours  Coagulation  profile  No results found for this basename: INR, PROTIME,  in the last 168 hours    Assessment & Plan   Active Problems: Accelerated hypertension: Currently no chest pains or shortness of breath or blurry vision - Will give prescription for lisinopril/HCTZ 20/12.5 mg daily, atenolol 50 mg daily  Hyperthyroidism:  - Cont tapazole 20mg  daily  Newly diagnosed diabetes mellitus - Hemoglobin A1c was 8.8, on 05/06/13 , will start patient on metformin 500MG  bid ac  Health screening: - Flu shot today  Recommendations: Check hemoglobin A1c and thyroid panel At the next visit in 3 months Follow-up in 3 months  Paperwork completed for her job    Zonie Crutcher M.D. 05/16/2013, 11:34 AM

## 2013-05-16 NOTE — Progress Notes (Signed)
Pt is here for a F/U visit. Pt is here to review her MRI and lab results.

## 2013-06-03 ENCOUNTER — Telehealth: Payer: Self-pay | Admitting: Internal Medicine

## 2013-06-03 NOTE — Telephone Encounter (Signed)
Angelique Blonder is working on note

## 2013-06-03 NOTE — Telephone Encounter (Signed)
Pt has come in this morning to see if she can obtain a Dr. Note indicating that she is able to return to work;

## 2013-08-06 ENCOUNTER — Encounter (HOSPITAL_BASED_OUTPATIENT_CLINIC_OR_DEPARTMENT_OTHER): Payer: Self-pay | Admitting: Emergency Medicine

## 2013-08-06 DIAGNOSIS — X500XXA Overexertion from strenuous movement or load, initial encounter: Secondary | ICD-10-CM | POA: Insufficient documentation

## 2013-08-06 DIAGNOSIS — Y939 Activity, unspecified: Secondary | ICD-10-CM | POA: Insufficient documentation

## 2013-08-06 DIAGNOSIS — Y929 Unspecified place or not applicable: Secondary | ICD-10-CM | POA: Insufficient documentation

## 2013-08-06 DIAGNOSIS — Z792 Long term (current) use of antibiotics: Secondary | ICD-10-CM | POA: Insufficient documentation

## 2013-08-06 DIAGNOSIS — I10 Essential (primary) hypertension: Secondary | ICD-10-CM | POA: Insufficient documentation

## 2013-08-06 DIAGNOSIS — Z791 Long term (current) use of non-steroidal anti-inflammatories (NSAID): Secondary | ICD-10-CM | POA: Insufficient documentation

## 2013-08-06 DIAGNOSIS — S6390XA Sprain of unspecified part of unspecified wrist and hand, initial encounter: Secondary | ICD-10-CM | POA: Insufficient documentation

## 2013-08-06 DIAGNOSIS — Z79899 Other long term (current) drug therapy: Secondary | ICD-10-CM | POA: Insufficient documentation

## 2013-08-06 DIAGNOSIS — E119 Type 2 diabetes mellitus without complications: Secondary | ICD-10-CM | POA: Insufficient documentation

## 2013-08-06 NOTE — ED Notes (Signed)
Pt sts accidentally bent left 4th digit too forcefully, sts finger will not straighten without severe pain. Sts will not extend straight. Upon assessment, pt making fist with rest of hand and 4th digit does not straighten.

## 2013-08-07 ENCOUNTER — Emergency Department (HOSPITAL_BASED_OUTPATIENT_CLINIC_OR_DEPARTMENT_OTHER): Payer: 59

## 2013-08-07 ENCOUNTER — Emergency Department (HOSPITAL_BASED_OUTPATIENT_CLINIC_OR_DEPARTMENT_OTHER)
Admission: EM | Admit: 2013-08-07 | Discharge: 2013-08-07 | Disposition: A | Discharge status: Home / Self Care | Payer: 59 | Attending: Emergency Medicine | Admitting: Emergency Medicine

## 2013-08-07 DIAGNOSIS — S63615A Unspecified sprain of left ring finger, initial encounter: Secondary | ICD-10-CM

## 2013-08-07 NOTE — ED Provider Notes (Signed)
CSN: [id number]     Arrival date & time 08/06/13  2310 History   First MD Initiated Contact with Patient 08/07/13 0109     Chief Complaint  Patient presents with  . Finger Injury   (Consider location/radiation/quality/duration/timing/severity/associated sxs/prior Treatment) HPI Comments: Is a 32 year old female who bent her left ring finger too far and it will now not straighten out. She denies having fallen are any other trauma. There is no numbness or tingling.this is ever happened before. The pain is worse with bending and movement and relieved with rest.   The history is provided by the patient.    Past Medical History  Diagnosis Date  . Hypertension   . Diabetes mellitus    Past Surgical History  Procedure Laterality Date  . Cholecystectomy    . Cesarean section     Family History  Problem Relation Age of Onset  . Hypertension Mother   . Hypertension Father    History  Substance Use Topics  . Smoking status: Never Smoker   . Smokeless tobacco: Not on file  . Alcohol Use: No   OB History   Grav Para Term Preterm Abortions TAB SAB Ect Mult Living                 Review of Systems  All other systems reviewed and are negative.    Allergies  Orange  Home Medications   Current Outpatient Rx  Name  Route  Sig  Dispense  Refill  . atenolol (TENORMIN) 50 MG tablet   Oral   Take 1 tablet (50 mg total) by mouth daily.   30 tablet   4   . azithromycin (ZITHROMAX Z-PAK) 250 MG tablet      2 tablet PO on day one, then 1 tablet PO daily   6 each   0   . cefUROXime (CEFTIN) 500 MG tablet   Oral   Take 1 tablet (500 mg total) by mouth 2 (two) times daily.   20 tablet   0   . lisinopril-hydrochlorothiazide (ZESTORETIC) 20-12.5 MG per tablet   Oral   Take 1 tablet by mouth daily.   30 tablet   4   . metFORMIN (GLUCOPHAGE) 500 MG tablet   Oral   Take 1 tablet (500 mg total) by mouth 2 (two) times daily with a meal.   60 tablet   4   . methimazole  (TAPAZOLE) 10 MG tablet   Oral   Take 2 tablets (20 mg total) by mouth daily.   60 tablet   4   . naproxen sodium (ANAPROX) 220 MG tablet   Oral   Take 440 mg by mouth 2 (two) times daily with a meal.          BP 188/91  Pulse 104  Temp(Src) 98.8 F (37.1 C) (Oral)  Resp 20  Ht 5\' 4"  (1.626 m)  Wt 220 lb (99.791 kg)  BMI 37.74 kg/m2  SpO2 98% Physical Exam  Nursing note and vitals reviewed. Constitutional: She is oriented to person, place, and time. She appears well-developed and well-nourished. No distress.  HENT:  Head: Normocephalic and atraumatic.  Neck: Normal range of motion. Neck supple.  Musculoskeletal:  The left ring finger is noted to be flexed at the proximal interphalangeal joint. There is severe pain with attempted range of motion. Sensation is intact and capillary refill is adequate.  Neurological: She is alert and oriented to person, place, and time.  Skin: Skin is warm and dry.  She is not diaphoretic.    ED Course  Procedures (including critical care time) Labs Review Labs Reviewed - No data to display Imaging Review Dg Hand Complete Left  08/07/2013   CLINICAL DATA:  Patient unable to extend to the left ring finger.  EXAM: LEFT HAND - COMPLETE 3+ VIEW  COMPARISON:  None.  FINDINGS: Left ring finger is flexed at the PIP joint. The PIP joint is located. DIP joint is mildly flexed. The other rays appear within normal limits. There is no fracture. No acute osseous abnormality or destructive osseous lesion.  IMPRESSION: Flexion of the ring finger at the PIP joint. This may represent extensor injury or entrapment of the flexor tendon preventing extension. No osseous injury.   Electronically Signed   By: Andreas Newport M.D.   On: 08/07/2013 01:49      MDM  No diagnosis found. X-rays reveal no dislocation. Digital block was performed with 2% lidocaine and the finger was straightened. It will be placed in a splint and she is to follow up with hand surgery if  her symptoms recur.    Geoffery Lyons, MD 08/07/13 [phone]

## 2013-08-16 ENCOUNTER — Ambulatory Visit: Payer: 59

## 2013-08-23 ENCOUNTER — Encounter: Payer: Self-pay | Admitting: Internal Medicine

## 2013-08-23 ENCOUNTER — Ambulatory Visit: Payer: 59 | Attending: Internal Medicine | Admitting: Internal Medicine

## 2013-08-23 VITALS — BP 156/87 | HR 98 | Temp 98.9°F | Resp 14 | Ht 64.0 in | Wt 232.4 lb

## 2013-08-23 DIAGNOSIS — I10 Essential (primary) hypertension: Secondary | ICD-10-CM | POA: Insufficient documentation

## 2013-08-23 DIAGNOSIS — E119 Type 2 diabetes mellitus without complications: Secondary | ICD-10-CM | POA: Insufficient documentation

## 2013-08-23 LAB — COMPLETE METABOLIC PANEL WITH GFR
ALT: 32 U/L (ref 0–35)
AST: 22 U/L (ref 0–37)
Albumin: 3.7 g/dL (ref 3.5–5.2)
Alkaline Phosphatase: 216 U/L — ABNORMAL HIGH (ref 39–117)
BUN: 17 mg/dL (ref 6–23)
CO2: 25 mEq/L (ref 19–32)
Calcium: 9.4 mg/dL (ref 8.4–10.5)
Chloride: 104 mEq/L (ref 96–112)
Creat: 0.4 mg/dL — ABNORMAL LOW (ref 0.50–1.10)
GFR, Est African American: >89 mL/min
GFR, Est Non African American: >89 mL/min
Glucose, Bld: 272 mg/dL — ABNORMAL HIGH (ref 70–99)
Potassium: 4.7 mEq/L (ref 3.5–5.3)
Sodium: 138 mEq/L (ref 135–145)
Total Bilirubin: 0.8 mg/dL (ref 0.3–1.2)
Total Protein: 6.9 g/dL (ref 6.0–8.3)

## 2013-08-23 LAB — CBC WITH DIFFERENTIAL/PLATELET
Basophils Absolute: 0 10*3/uL (ref 0.0–0.1)
Basophils Relative: 0 % (ref 0–1)
Eosinophils Absolute: 0 10*3/uL (ref 0.0–0.7)
Eosinophils Relative: 1 % (ref 0–5)
HCT: 37.8 % (ref 36.0–46.0)
Hemoglobin: 12.5 g/dL (ref 12.0–15.0)
Lymphocytes Relative: 35 % (ref 12–46)
Lymphs Abs: 1.8 10*3/uL (ref 0.7–4.0)
MCH: 27.3 pg (ref 26.0–34.0)
MCHC: 33.1 g/dL (ref 30.0–36.0)
MCV: 82.5 fL (ref 78.0–100.0)
Monocytes Absolute: 0.7 10*3/uL (ref 0.1–1.0)
Monocytes Relative: 13 % — ABNORMAL HIGH (ref 3–12)
Neutro Abs: 2.6 10*3/uL (ref 1.7–7.7)
Neutrophils Relative %: 51 % (ref 43–77)
Platelets: 165 10*3/uL (ref 150–400)
RBC: 4.58 MIL/uL (ref 3.87–5.11)
RDW: 15.1 % (ref 11.5–15.5)
WBC: 5.1 10*3/uL (ref 4.0–10.5)

## 2013-08-23 LAB — POCT GLYCOSYLATED HEMOGLOBIN (HGB A1C): Hemoglobin A1C: 9.6

## 2013-08-23 LAB — LIPID PANEL
Cholesterol: 125 mg/dL (ref 0–200)
HDL: 57 mg/dL (ref >39)
LDL Cholesterol: 56 mg/dL (ref 0–99)
Total CHOL/HDL Ratio: 2.2 Ratio
Triglycerides: 61 mg/dL (ref <150)
VLDL: 12 mg/dL (ref 0–40)

## 2013-08-23 LAB — GLUCOSE, POCT (MANUAL RESULT ENTRY): POC Glucose: 250 mg/dl — AB (ref 70–99)

## 2013-08-23 MED ORDER — FREESTYLE SYSTEM KIT: 1.0000 | PACK | Status: DC | PRN

## 2013-08-23 MED ORDER — METFORMIN HCL 500 MG PO TABS: 500.0000 mg | ORAL_TABLET | Freq: Two times a day (BID) | ORAL | Status: DC

## 2013-08-23 MED ORDER — HYDROCHLOROTHIAZIDE 12.5 MG PO TABS: 25.0000 mg | ORAL_TABLET | Freq: Every day | ORAL | Status: DC

## 2013-08-23 MED ORDER — LISINOPRIL 20 MG PO TABS: 20.0000 mg | ORAL_TABLET | Freq: Every day | ORAL | Status: DC

## 2013-08-23 MED ORDER — METHIMAZOLE 10 MG PO TABS: 20.0000 mg | ORAL_TABLET | Freq: Every day | ORAL | Status: DC

## 2013-08-23 MED ORDER — GLUCOSE BLOOD VI STRP: ORAL_STRIP | Status: DC

## 2013-08-23 MED ORDER — ATENOLOL 50 MG PO TABS: 75.0000 mg | ORAL_TABLET | Freq: Every day | ORAL | Status: DC

## 2013-08-23 NOTE — Progress Notes (Signed)
Patient ID: Rhonda Baldwin, female   DOB: 11/02/1980, 33 y.o.   MRN: [id number]   CC:  HPI: 33 year old female with a history of hypertension, still uncontrolled with systolic blood pressure in the 160s. She was recently in the ER with trigger finger and had a blood pressure the 180s. She denies any chest pain any shortness of breath. She states that she's been compliant with her medications   Allergies  Allergen Reactions  . Orange Swelling    oranges   Past Medical History  Diagnosis Date  . Hypertension   . Diabetes mellitus    Current Outpatient Prescriptions on File Prior to Visit  Medication Sig Dispense Refill  . naproxen sodium (ANAPROX) 220 MG tablet Take 440 mg by mouth 2 (two) times daily with a meal.       No current facility-administered medications on file prior to visit.   Family History  Problem Relation Age of Onset  . Hypertension Mother   . Hypertension Father    History   Social History  . Marital Status: Married    Spouse Name: N/A    Number of Children: N/A  . Years of Education: N/A   Occupational History  . Not on file.   Social History Main Topics  . Smoking status: Never Smoker   . Smokeless tobacco: Not on file  . Alcohol Use: No  . Drug Use: No  . Sexual Activity: Yes    Birth Control/ Protection: None   Other Topics Concern  . Not on file   Social History Narrative  . No narrative on file    Review of Systems  Constitutional: Negative for fever, chills, diaphoresis, activity change, appetite change and fatigue.  HENT: Negative for ear pain, nosebleeds, congestion, facial swelling, rhinorrhea, neck pain, neck stiffness and ear discharge.   Eyes: Negative for pain, discharge, redness, itching and visual disturbance.  Respiratory: Negative for cough, choking, chest tightness, shortness of breath, wheezing and stridor.   Cardiovascular: Negative for chest pain, palpitations and leg swelling.  Gastrointestinal: Negative for abdominal  distention.  Genitourinary: Negative for dysuria, urgency, frequency, hematuria, flank pain, decreased urine volume, difficulty urinating and dyspareunia.  Musculoskeletal: Negative for back pain, joint swelling, arthralgias and gait problem.  Neurological: Negative for dizziness, tremors, seizures, syncope, facial asymmetry, speech difficulty, weakness, light-headedness, numbness and headaches.  Hematological: Negative for adenopathy. Does not bruise/bleed easily.  Psychiatric/Behavioral: Negative for hallucinations, behavioral problems, confusion, dysphoric mood, decreased concentration and agitation.    Objective:   Filed Vitals:   08/23/13 0902  BP: 156/87  Pulse: 98  Temp: 98.9 F (37.2 C)  Resp: 14    Physical Exam  Constitutional: Appears well-developed and well-nourished. No distress.  HENT: Normocephalic. External right and left ear normal. Oropharynx is clear and moist.  Eyes: Conjunctivae and EOM are normal. PERRLA, no scleral icterus.  Neck: Normal ROM. Neck supple. No JVD. No tracheal deviation. No thyromegaly.  CVS: RRR, S1/S2 +, no murmurs, no gallops, no carotid bruit.  Pulmonary: Effort and breath sounds normal, no stridor, rhonchi, wheezes, rales.  Abdominal: Soft. BS +,  no distension, tenderness, rebound or guarding.  Musculoskeletal: Normal range of motion. No edema and no tenderness.  Lymphadenopathy: No lymphadenopathy noted, cervical, inguinal. Neuro: Alert. Normal reflexes, muscle tone coordination. No cranial nerve deficit. Skin: Skin is warm and dry. No rash noted. Not diaphoretic. No erythema. No pallor.  Psychiatric: Normal mood and affect. Behavior, judgment, thought content normal.   Lab Results  Component Value  Date   WBC 4.1 05/06/2013   HGB 11.5* 05/06/2013   HCT 34.1* 05/06/2013   MCV 75.8* 05/06/2013   PLT 162 05/06/2013   Lab Results  Component Value Date   CREATININE <0.30* 05/06/2013   BUN 8 05/06/2013   NA 138 05/06/2013   K 3.8 05/06/2013    CL 107 05/06/2013   CO2 22 05/06/2013    Lab Results  Component Value Date   HGBA1C 9.6 08/23/2013   Lipid Panel     Component Value Date/Time   CHOL 116 05/06/2013 1210   TRIG 45 05/06/2013 1210   HDL 34* 05/06/2013 1210   CHOLHDL 3.4 05/06/2013 1210   VLDL 9 05/06/2013 1210   LDLCALC 73 05/06/2013 1210       Assessment and plan:   Patient Active Problem List   Diagnosis Date Noted  . Obesity, unspecified 05/03/2013  . Primary hyperthyroidism 08/19/2012  . Pneumonia 08/17/2012  . Nausea vomiting and diarrhea 08/17/2012  . Gastritis 08/17/2012  . HTN (hypertension) 08/17/2012  . DM (diabetes mellitus) 08/17/2012       Hypertension Increase atenolol to 75 mg Increase hydrochlorothiazide to 25 mg Lisinopril continue at 20 mg   Diabetes A1c today Glucometer and glucose strips provided Continue metformin  Hyperthyroidism Continue Tapazole  Followup in 3 months   The patient was given clear instructions to go to ER or return to medical center if symptoms don't improve, worsen or new problems develop. The patient verbalized understanding. The patient was told to call to get any lab results if not heard anything in the next week.

## 2013-08-23 NOTE — Progress Notes (Signed)
Pt is here for a f/u. Pt feels pretty good.

## 2013-08-24 ENCOUNTER — Telehealth: Payer: Self-pay | Admitting: *Deleted

## 2013-08-24 LAB — T4, FREE: Free T4: 6.2 ng/dL — ABNORMAL HIGH (ref 0.80–1.80)

## 2013-08-24 LAB — TSH: TSH: <0.008 u[IU]/mL — ABNORMAL LOW (ref 0.350–4.500)

## 2013-08-24 MED ORDER — METFORMIN HCL 1000 MG PO TABS: 1000.0000 mg | ORAL_TABLET | Freq: Two times a day (BID) | ORAL | Status: DC

## 2013-08-24 NOTE — Addendum Note (Signed)
Addended by: Susie CassetteABROL MD, Germain OsgoodNAYANA on: 08/24/2013 11:03 AM   Modules accepted: Orders, Medications

## 2013-08-24 NOTE — Telephone Encounter (Signed)
Message copied by Ayleen Mckinstry, [place] R on Sat Aug 24, 2013 12:00 PM ------      Message from: Susie CassetteABROL MD, Germain OsgoodNAYANA      Created: Sat Aug 24, 2013 11:04 AM       Increased metformin to 1000 mg bid, sent to [facility],      pls put in for endocrinology referral for hyperthyroidism, due to abnl thyroid function ------

## 2013-08-24 NOTE — Telephone Encounter (Signed)
Res[facility] medication to pharmacy [facility].

## 2013-10-28 ENCOUNTER — Other Ambulatory Visit: Payer: Self-pay | Admitting: Internal Medicine

## 2013-11-25 ENCOUNTER — Encounter: Payer: Self-pay | Admitting: Family Medicine

## 2013-11-25 ENCOUNTER — Ambulatory Visit (INDEPENDENT_AMBULATORY_CARE_PROVIDER_SITE_OTHER): Payer: 59 | Admitting: Family Medicine

## 2013-11-25 VITALS — BP 149/90 | HR 117 | Temp 98.7°F | Ht 63.0 in | Wt 241.2 lb

## 2013-11-25 DIAGNOSIS — Z01419 Encounter for gynecological examination (general) (routine) without abnormal findings: Secondary | ICD-10-CM

## 2013-11-25 NOTE — Patient Instructions (Signed)
-   nice to meet you! Let us know how we can help in the future.

## 2013-11-25 NOTE — Progress Notes (Signed)
GYN OFFICE NOTE  Chief Complaint:  annual exam  Primary Care Physician: Angelica Chessman, MD  HPI:  Rhonda Baldwin is a 33 yo AAF who presents today for her routine GYN exam.  No changes in her routine health.  Has dm, HTN, hyperthyroid.   No abnormal pap smears Interested in STD testing Sexually active- one partner. No concerns.  Not using contraception   LMP 10/27/13. Regular happen monthly. Last for approx 7days. Occasionally longer than that. Usually has heavy bleeding for 3-4 days with clots. Changes a pad every 3-4 hours for those days. After that slows down.   PMHx:  Past Medical History  Diagnosis Date  . Hypertension   . Diabetes mellitus   . Thyroid disease     Past Surgical History  Procedure Laterality Date  . Cholecystectomy    . Cesarean section      FAMHx:  Family History  Problem Relation Age of Onset  . Hypertension Mother   . Hypertension Father     SOCHx:   reports that she has never smoked. She has never used smokeless tobacco. She reports that she does not drink alcohol or use illicit drugs.  ALLERGIES:  Allergies  Allergen Reactions  . Orange Swelling    oranges    ROS: Pertinent ROS as seen in HPI. Otherwise negative.   HOME MEDS: Current Outpatient Prescriptions  Medication Sig Dispense Refill  . atenolol (TENORMIN) 50 MG tablet Take 1.5 tablets (75 mg total) by mouth daily.  60 tablet  4  . hydrochlorothiazide (HYDRODIURIL) 12.5 MG tablet Take 2 tablets (25 mg total) by mouth daily.  30 tablet  2  . lisinopril (PRINIVIL,ZESTRIL) 20 MG tablet Take 1 tablet (20 mg total) by mouth daily.  90 tablet  3  . metFORMIN (GLUCOPHAGE) 1000 MG tablet Take 1 tablet (1,000 mg total) by mouth 2 (two) times daily with a meal.  180 tablet  3  . methimazole (TAPAZOLE) 10 MG tablet Take 2 tablets (20 mg total) by mouth daily.  60 tablet  4  . naproxen sodium (ANAPROX) 220 MG tablet Take 440 mg by mouth 2 (two) times daily with a meal.      . glucose  blood test strip Use as instructed  100 each  12  . glucose monitoring kit (FREESTYLE) monitoring kit 1 each by Does not apply route as needed for other.  1 each  2   No current facility-administered medications for this visit.    LABS/IMAGING: No results found for this or any previous visit (from the past 48 hour(s)). No results found.  VITALS: BP 149/90  Pulse 117  Temp(Src) 98.7 F (37.1 C)  Ht '5\' 3"'  (1.6 m)  Wt 241 lb 3.2 oz (109.408 kg)  BMI 42.74 kg/m2  LMP 10/27/2013  EXAM: GENERAL: Well-developed, obese, female in no acute distress.  HEENT: Normocephalic, atraumatic. Sclerae anicteric.  NECK: Supple. Normal thyroid.  LUNGS: Clear to auscultation bilaterally.  HEART: Regular rate and rhythm. BREASTS: Symmetric in size. No masses, skin changes, nipple drainage, or lymphadenopathy. ABDOMEN: Soft, nontender, nondistended. No organomegaly. PELVIC: Normal external female genitalia. Vagina is pink and rugated.  Normal discharge. Normal cervix contour. Pap smear obtained. Uterus is normal in size. No adnexal mass or tenderness.  EXTREMITIES: No cyanosis, clubbing, or edema, 2+ distal pulses.   ASSESSMENT: Routine gynecological examination - Plan: Cytology - PAP  PLAN:  1) routine gyn - pap done today - no abnormalities - praised for weight loss of 100 lbs  and encouraged to cont   2) pt shared desire to conceive and does not want contraception - discussed she would benefit from preconception counseling as she is on multiple medications that are unsafe in pregnancy.   - pt to think about this and make appt if she would like  F/u prn or in 1 year.   Kassie Mends, MD

## 2013-11-28 ENCOUNTER — Ambulatory Visit: Payer: 59 | Attending: Internal Medicine | Admitting: Internal Medicine

## 2013-11-28 ENCOUNTER — Encounter: Payer: Self-pay | Admitting: Internal Medicine

## 2013-11-28 VITALS — BP 136/89 | HR 107 | Temp 98.3°F | Resp 16 | Ht 64.0 in | Wt 238.0 lb

## 2013-11-28 DIAGNOSIS — Z79899 Other long term (current) drug therapy: Secondary | ICD-10-CM | POA: Insufficient documentation

## 2013-11-28 DIAGNOSIS — E119 Type 2 diabetes mellitus without complications: Secondary | ICD-10-CM

## 2013-11-28 DIAGNOSIS — Z794 Long term (current) use of insulin: Secondary | ICD-10-CM | POA: Insufficient documentation

## 2013-11-28 DIAGNOSIS — E0590 Thyrotoxicosis, unspecified without thyrotoxic crisis or storm: Secondary | ICD-10-CM

## 2013-11-28 DIAGNOSIS — E213 Hyperparathyroidism, unspecified: Secondary | ICD-10-CM | POA: Insufficient documentation

## 2013-11-28 DIAGNOSIS — I10 Essential (primary) hypertension: Secondary | ICD-10-CM

## 2013-11-28 DIAGNOSIS — E669 Obesity, unspecified: Secondary | ICD-10-CM

## 2013-11-28 LAB — POCT GLYCOSYLATED HEMOGLOBIN (HGB A1C): Hemoglobin A1C: 10

## 2013-11-28 LAB — GLUCOSE, POCT (MANUAL RESULT ENTRY): POC Glucose: 297 mg/dl — AB (ref 70–99)

## 2013-11-28 MED ORDER — INSULIN NPH ISOPHANE & REGULAR (70-30) 100 UNIT/ML ~~LOC~~ SUSP
20.0000 [IU] | Freq: Two times a day (BID) | SUBCUTANEOUS | Status: DC
Start: 2013-11-28 — End: 2014-04-17

## 2013-11-28 MED ORDER — INSULIN ASPART 100 UNIT/ML ~~LOC~~ SOLN
10.0000 [IU] | Freq: Once | SUBCUTANEOUS | Status: AC
  Administered 2013-11-28: 10 [IU] via SUBCUTANEOUS

## 2013-11-28 NOTE — Patient Instructions (Signed)

## 2013-11-28 NOTE — Progress Notes (Signed)
Pt is here following up on her diabetes and HTN.  

## 2013-11-28 NOTE — Progress Notes (Signed)
Patient ID: Rhonda Baldwin, female   DOB: [birth year], 33 y.o.   MRN: [id number]   Rhonda Baldwin, is a 33 y.o. female  [id number]  [id number]  DOB - 03-01-1981  Chief Complaint  Patient presents with  . Follow-up        Subjective:   Rhonda Baldwin is a 33 y.o. female here today for a follow up visit. Patient is known to have hypertension, diabetes mellitus, and hyperparathyroidism, morbid obesity. She is currently on metformin 1000 mg tablet by mouth twice a day, hydrochlorothiazide 12.5 mg tablet by mouth daily, atenolol 50 mg tablet by mouth daily, and lisinopril 20 mg tablet by mouth daily and methimazole 10 mg tablet by mouth daily for hypothyroidism. She is actively trying to achieve pregnancy and has sought help from a gynecologist. She is here to followup on her medical condition as well as for medication review for pregnancy. She has no other complaints today. Her blood sugar is still out of control but her blood pressure is better controlled, her heart rate is also better controlled with atenolol. Her hemoglobin A1c today is 10% from 9.6% previously. Patient has No headache, No chest pain, No abdominal pain - No Nausea, No new weakness tingling or numbness, No Cough - SOB.  Problem  Diabetes    ALLERGIES: Allergies  Allergen Reactions  . Orange Swelling    oranges    PAST MEDICAL HISTORY: Past Medical History  Diagnosis Date  . Hypertension   . Diabetes mellitus   . Thyroid disease     MEDICATIONS AT HOME: Prior to Admission medications   Medication Sig Start Date End Date Taking? Authorizing Provider  atenolol (TENORMIN) 50 MG tablet Take 1.5 tablets (75 mg total) by mouth daily. 08/23/13  Yes Reyne Dumas, MD  glucose blood test strip Use as instructed 08/23/13  Yes Reyne Dumas, MD  glucose monitoring kit (FREESTYLE) monitoring kit 1 each by Does not apply route as needed for other. 08/23/13  Yes Reyne Dumas, MD  hydrochlorothiazide (HYDRODIURIL) 12.5 MG tablet  Take 2 tablets (25 mg total) by mouth daily. 08/23/13  Yes Reyne Dumas, MD  lisinopril (PRINIVIL,ZESTRIL) 20 MG tablet Take 1 tablet (20 mg total) by mouth daily. 08/23/13  Yes Reyne Dumas, MD  metFORMIN (GLUCOPHAGE) 1000 MG tablet Take 1 tablet (1,000 mg total) by mouth 2 (two) times daily with a meal. 08/24/13  Yes Reyne Dumas, MD  methimazole (TAPAZOLE) 10 MG tablet Take 2 tablets (20 mg total) by mouth daily. 08/23/13  Yes Reyne Dumas, MD  naproxen sodium (ANAPROX) 220 MG tablet Take 440 mg by mouth 2 (two) times daily with a meal.   Yes Historical Provider, MD  insulin NPH-regular Human (NOVOLIN 70/30) (70-30) 100 UNIT/ML injection Inject 20 Units into the skin 2 (two) times daily with a meal. 11/28/13   Angelica Chessman, MD     Objective:   Filed Vitals:   11/28/13 0901  BP: 136/89  Pulse: 107  Temp: 98.3 F (36.8 C)  TempSrc: Oral  Resp: 16  Height: $Remove'5\' 4"'fmnbYkB$  (1.626 m)  Weight: 238 lb (107.956 kg)  SpO2: 100%    Exam General appearance : Awake, alert, not in any distress. Speech Clear. Not toxic looking HEENT: Atraumatic and Normocephalic, pupils equally reactive to light and accomodation Neck: supple, no JVD. No cervical lymphadenopathy.  Chest:Good air entry bilaterally, no added sounds  CVS: S1 S2 regular, no murmurs.  Abdomen: Bowel sounds present, Non tender and not distended with no gaurding, rigidity or rebound.  Extremities: B/L Lower Ext shows no edema, both legs are warm to touch Neurology: Awake alert, and oriented X 3, CN II-XII intact, Non focal Skin:No Rash Wounds:N/A  Data Review Lab Results  Component Value Date   HGBA1C 10 11/28/2013   HGBA1C 9.6 08/23/2013   HGBA1C 8.8* 05/06/2013     Assessment & Plan   1. Diabetes  - Glucose (CBG) - HgB A1c Patient is actively trying to achieve pregnancy at the moment, we will discontinue metformin which is unsafe for pregnancy, I will start patient on insulin. She has been being given clear instruction to record her  ambulatory blood sugar check, bring the log to the clinic in 2 weeks to determine if she is on the right dose. Patient was counseled extensively about the use of insulin and the signs and symptoms of hypoglycemia and what to do if she has symptoms of hypoglycemia  - insulin aspart (novoLOG) injection 10 Units; Inject 0.1 mLs (10 Units total) into the skin once. - insulin NPH-regular Human (NOVOLIN 70/30) (70-30) 100 UNIT/ML injection; Inject 20 Units into the skin 2 (two) times daily with a meal.  Dispense: 3 vial; Refill: 3  2. HTN (hypertension) Continue atenolol at the current dose and hydrochlorothiazide, we will discontinue as well as pregnancy is achieved and substitute with other medication compatible with pregnancy. We will also discontinue lisinopril before pregnancy   3. Primary hyperthyroidism Methimazole is not safe for pregnancy, patient has been advised to stay off medication and to continue atenolol for both hypertension and tachycardia, this will have to be stopped shortly after she achieves pregnancy because of fetal intrauterine growth retardation associated, and will be substituted with other medications for hypertension that is compatible with pregnancy  4. Obesity, unspecified Patient was extensively counseled on nutrition and exercise    Return in about 2 weeks (around 12/12/2013), or if symptoms worsen or fail to improve, for CBG, Lab/Nurse Visit.  The patient was given clear instructions to go to ER or return to medical center if symptoms don't improve, worsen or new problems develop. The patient verbalized understanding. The patient was told to call to get lab results if they haven't heard anything in the next week.   This note has been created with Surveyor, quantity. Any transcriptional errors are unintentional.    Angelica Chessman, MD, [place], [place], [place] and [facility] [place],  [place]   11/28/2013, 10:16 AM

## 2013-12-05 ENCOUNTER — Telehealth: Payer: Self-pay

## 2013-12-05 NOTE — Telephone Encounter (Signed)
Called pt and left message that her results are normal and to please call us in one year for another testing. If she has any questions to please give us a call.

## 2013-12-05 NOTE — Telephone Encounter (Signed)
Message copied by Faythe CasaBELLAMY, Charrise Lardner M on Thu Dec 05, 2013  9:46 AM ------      Message from: Vale HavenBECK, KELI L      Created: Thu Dec 05, 2013  6:06 AM       She will need a repeat PAP with cotesting in 1 year. Can you let her know?  Thanks! ------

## 2013-12-20 ENCOUNTER — Other Ambulatory Visit: Payer: Self-pay | Admitting: Internal Medicine

## 2013-12-20 DIAGNOSIS — I10 Essential (primary) hypertension: Secondary | ICD-10-CM

## 2014-01-08 ENCOUNTER — Ambulatory Visit: Payer: 59 | Attending: Internal Medicine | Admitting: *Deleted

## 2014-01-08 VITALS — BP 154/82 | HR 121 | Temp 98.9°F | Resp 16

## 2014-01-08 DIAGNOSIS — R112 Nausea with vomiting, unspecified: Secondary | ICD-10-CM

## 2014-01-08 LAB — POCT URINE PREGNANCY: Preg Test, Ur: NEGATIVE

## 2014-01-08 LAB — GLUCOSE, POCT (MANUAL RESULT ENTRY): POC Glucose: 226 mg/dl — AB (ref 70–99)

## 2014-01-08 NOTE — Progress Notes (Unsigned)
Patient in today complaining of vomiting and being tired. Patient urine pregnancy negative. Patient states she has not been taking her insulin cause she is not eating, Informed patient she needs to take her insulin and try broth, jello, and etc and advance as tolerated. Patient informed to return to clinic next week if she is not feeling better. Reather Laurence, RN

## 2014-01-10 ENCOUNTER — Emergency Department (HOSPITAL_BASED_OUTPATIENT_CLINIC_OR_DEPARTMENT_OTHER)
Admission: EM | Admit: 2014-01-10 | Discharge: 2014-01-11 | Disposition: A | Discharge status: Home / Self Care | Payer: 59 | Attending: Emergency Medicine | Admitting: Emergency Medicine

## 2014-01-10 ENCOUNTER — Emergency Department (HOSPITAL_BASED_OUTPATIENT_CLINIC_OR_DEPARTMENT_OTHER): Payer: 59

## 2014-01-10 ENCOUNTER — Encounter (HOSPITAL_BASED_OUTPATIENT_CLINIC_OR_DEPARTMENT_OTHER): Payer: Self-pay | Admitting: Emergency Medicine

## 2014-01-10 DIAGNOSIS — Z79899 Other long term (current) drug therapy: Secondary | ICD-10-CM | POA: Insufficient documentation

## 2014-01-10 DIAGNOSIS — J159 Unspecified bacterial pneumonia: Secondary | ICD-10-CM | POA: Insufficient documentation

## 2014-01-10 DIAGNOSIS — Z791 Long term (current) use of non-steroidal anti-inflammatories (NSAID): Secondary | ICD-10-CM | POA: Insufficient documentation

## 2014-01-10 DIAGNOSIS — Z794 Long term (current) use of insulin: Secondary | ICD-10-CM | POA: Insufficient documentation

## 2014-01-10 DIAGNOSIS — J189 Pneumonia, unspecified organism: Secondary | ICD-10-CM

## 2014-01-10 DIAGNOSIS — J45901 Unspecified asthma with (acute) exacerbation: Secondary | ICD-10-CM | POA: Insufficient documentation

## 2014-01-10 DIAGNOSIS — Z792 Long term (current) use of antibiotics: Secondary | ICD-10-CM | POA: Insufficient documentation

## 2014-01-10 DIAGNOSIS — E119 Type 2 diabetes mellitus without complications: Secondary | ICD-10-CM | POA: Insufficient documentation

## 2014-01-10 DIAGNOSIS — M25473 Effusion, unspecified ankle: Secondary | ICD-10-CM | POA: Insufficient documentation

## 2014-01-10 DIAGNOSIS — Z8701 Personal history of pneumonia (recurrent): Secondary | ICD-10-CM | POA: Insufficient documentation

## 2014-01-10 DIAGNOSIS — M25476 Effusion, unspecified foot: Secondary | ICD-10-CM | POA: Insufficient documentation

## 2014-01-10 DIAGNOSIS — I10 Essential (primary) hypertension: Secondary | ICD-10-CM | POA: Insufficient documentation

## 2014-01-10 DIAGNOSIS — R000 Tachycardia, unspecified: Secondary | ICD-10-CM | POA: Insufficient documentation

## 2014-01-10 LAB — CBC WITH DIFFERENTIAL/PLATELET
Basophils Absolute: 0 10*3/uL (ref 0.0–0.1)
Basophils Relative: 0 % (ref 0–1)
Eosinophils Absolute: 0.1 10*3/uL (ref 0.0–0.7)
Eosinophils Relative: 1 % (ref 0–5)
HCT: 31.3 % — ABNORMAL LOW (ref 36.0–46.0)
Hemoglobin: 10.3 g/dL — ABNORMAL LOW (ref 12.0–15.0)
Lymphocytes Relative: 42 % (ref 12–46)
Lymphs Abs: 2.6 10*3/uL (ref 0.7–4.0)
MCH: 28 pg (ref 26.0–34.0)
MCHC: 32.9 g/dL (ref 30.0–36.0)
MCV: 85.1 fL (ref 78.0–100.0)
Monocytes Absolute: 1.2 10*3/uL — ABNORMAL HIGH (ref 0.1–1.0)
Monocytes Relative: 18 % — ABNORMAL HIGH (ref 3–12)
Neutro Abs: 2.5 10*3/uL (ref 1.7–7.7)
Neutrophils Relative %: 39 % — ABNORMAL LOW (ref 43–77)
Platelets: ADEQUATE 10*3/uL (ref 150–400)
RBC: 3.68 MIL/uL — ABNORMAL LOW (ref 3.87–5.11)
RDW: 11.9 % (ref 11.5–15.5)
Smear Review: ADEQUATE
WBC: 6.4 10*3/uL (ref 4.0–10.5)

## 2014-01-10 LAB — CBG MONITORING, ED: Glucose-Capillary: 247 mg/dL — ABNORMAL HIGH (ref 70–99)

## 2014-01-10 MED ORDER — ALBUTEROL SULFATE HFA 108 (90 BASE) MCG/ACT IN AERS: 1.0000 | INHALATION_SPRAY | Freq: Four times a day (QID) | RESPIRATORY_TRACT | Status: DC | PRN

## 2014-01-10 MED ORDER — IPRATROPIUM-ALBUTEROL 0.5-2.5 (3) MG/3ML IN SOLN
3.0000 mL | RESPIRATORY_TRACT | Status: DC
  Administered 2014-01-10: 3 mL via RESPIRATORY_TRACT
  Filled 2014-01-10: qty 3

## 2014-01-10 MED ORDER — AZITHROMYCIN 250 MG PO TABS: 250.0000 mg | ORAL_TABLET | Freq: Every day | ORAL | Status: DC

## 2014-01-10 MED ORDER — ENOXAPARIN SODIUM 120 MG/0.8ML ~~LOC~~ SOLN: 1.0000 mg/kg | Freq: Once | SUBCUTANEOUS | Status: DC

## 2014-01-10 MED ORDER — AZITHROMYCIN 250 MG PO TABS
500.0000 mg | ORAL_TABLET | Freq: Once | ORAL | Status: AC
  Administered 2014-01-11: 500 mg via ORAL
  Filled 2014-01-10: qty 2

## 2014-01-10 NOTE — ED Provider Notes (Signed)
CSN: [id number]     Arrival date & time 01/10/14  2116 History  This chart was scribed for Fredia Sorrow, MD by Zettie Pho, ED Scribe. This patient was seen in room MH04/MH04 and the patient's care was started at 10:10 PM.    Chief Complaint  Patient presents with  . Asthma   Patient is a 33 y.o. female presenting with asthma. The history is provided by the patient. No language interpreter was used.  Asthma This is a recurrent problem. The current episode started 3 to 5 hours ago. The problem occurs constantly. The problem has not changed since onset.Associated symptoms include headaches and shortness of breath. Pertinent negatives include no chest pain and no abdominal pain. Nothing relieves the symptoms. Treatments tried: inhaler. The treatment provided no relief.   HPI Comments: Rhonda Baldwin is a 33 y.o. Female with a history of asthma and pneumonia (per patient) who presents to the Emergency Department complaining of shortness of breath with an associated dry cough and onset about 3 hours ago. Patient reports using her inhaler at home without significant relief. She reports some wheezing initially, but denies any currently. Patient reports some mild swelling around the bilateral ankles. She reports a mild, generalized headache that has since resolved. She denies rhinorrhea, sore throat, chest pain. She denies fever, chills, visual disturbance, abdominal pain, nausea, vomiting, diarrhea, dysuria, rash, history of bleeding easily, neck pain, back pain. Patient also has a history of HTN, DM, and thyroid disease.   Past Medical History  Diagnosis Date  . Hypertension   . Diabetes mellitus   . Thyroid disease    Past Surgical History  Procedure Laterality Date  . Cholecystectomy    . Cesarean section     Family History  Problem Relation Age of Onset  . Hypertension Mother   . Hypertension Father    History  Substance Use Topics  . Smoking status: Never Smoker   . Smokeless tobacco:  Never Used  . Alcohol Use: No   OB History   Grav Para Term Preterm Abortions TAB SAB Ect Mult Living                 Review of Systems  Constitutional: Negative for fever and chills.  HENT: Negative for rhinorrhea and sore throat.   Eyes: Negative for visual disturbance.  Respiratory: Positive for cough, shortness of breath and wheezing (resolved).   Cardiovascular: Positive for leg swelling. Negative for chest pain.  Gastrointestinal: Negative for nausea, vomiting, abdominal pain and diarrhea.  Genitourinary: Negative for dysuria and hematuria.  Musculoskeletal: Negative for back pain and neck pain.  Skin: Negative for rash.  Neurological: Positive for headaches.  Hematological: Does not bruise/bleed easily.  Psychiatric/Behavioral: Negative for confusion.    Allergies  Orange  Home Medications   Prior to Admission medications   Medication Sig Start Date End Date Taking? Authorizing Provider  albuterol (PROVENTIL HFA;VENTOLIN HFA) 108 (90 BASE) MCG/ACT inhaler Inhale 1-2 puffs into the lungs every 6 (six) hours as needed for wheezing or shortness of breath. 01/10/14   Fredia Sorrow, MD  atenolol (TENORMIN) 50 MG tablet Take 1.5 tablets (75 mg total) by mouth daily. 08/23/13   Reyne Dumas, MD  azithromycin (ZITHROMAX) 250 MG tablet Take 1 tablet (250 mg total) by mouth daily. Take first 2 tablets together, then 1 every day until finished. 01/10/14   Fredia Sorrow, MD  glucose blood test strip Use as instructed 08/23/13   Reyne Dumas, MD  glucose monitoring kit (FREESTYLE)  monitoring kit 1 each by Does not apply route as needed for other. 08/23/13   Reyne Dumas, MD  hydrochlorothiazide (HYDRODIURIL) 12.5 MG tablet Take 2 tablets (25 mg total) by mouth daily. 08/23/13   Reyne Dumas, MD  hydrochlorothiazide (MICROZIDE) 12.5 MG capsule TAKE 2 CAPSULES BY MOUTH EVERYDAY 12/20/13   Angelica Chessman, MD  insulin NPH-regular Human (NOVOLIN 70/30) (70-30) 100 UNIT/ML injection Inject 20  Units into the skin 2 (two) times daily with a meal. 11/28/13   Angelica Chessman, MD  lisinopril (PRINIVIL,ZESTRIL) 20 MG tablet Take 1 tablet (20 mg total) by mouth daily. 08/23/13   Reyne Dumas, MD  methimazole (TAPAZOLE) 10 MG tablet Take 2 tablets (20 mg total) by mouth daily. 08/23/13   Reyne Dumas, MD  naproxen sodium (ANAPROX) 220 MG tablet Take 440 mg by mouth 2 (two) times daily with a meal.    Historical Provider, MD   Triage Vitals: BP 214/99  Pulse 120  Temp(Src) 98.7 F (37.1 C) (Oral)  Resp 30  Ht '5\' 6"'  (1.676 m)  Wt 250 lb (113.399 kg)  BMI 40.37 kg/m2  SpO2 100%  LMP 12/30/2013  Physical Exam  Nursing note and vitals reviewed. Constitutional: She is oriented to person, place, and time. She appears well-developed and well-nourished. No distress.  HENT:  Head: Normocephalic and atraumatic.  Mouth/Throat: Oropharynx is clear and moist. No oropharyngeal exudate.  Eyes: Conjunctivae and EOM are normal.  Neck: Normal range of motion. Neck supple.  Cardiovascular: Regular rhythm and normal heart sounds.   Slightly tachycardic.   Pulmonary/Chest: Effort normal and breath sounds normal. No respiratory distress. She has no wheezes.  Abdominal: Soft. Bowel sounds are normal. She exhibits no distension. There is no tenderness.  Musculoskeletal: Normal range of motion. She exhibits edema.  Trace edema to bilateral ankles. 1 second capillary refill in bilateral great toes.   Neurological: She is alert and oriented to person, place, and time. No cranial nerve deficit. She exhibits normal muscle tone. Coordination normal.  Skin: Skin is warm and dry.  Psychiatric: She has a normal mood and affect. Her behavior is normal.    ED Course  Procedures (including critical care time)  DIAGNOSTIC STUDIES: Oxygen Saturation is 100% on room air, normal by my interpretation.    COORDINATION OF CARE: 10:15 PM- Will order chest x-ray, CBG, BNP, CBC, BMP, and D-dimer. Will order  ipratropium-albuterol breathing treatment to manage symptoms. Discussed treatment plan with patient at bedside and patient verbalized agreement.   Medications  ipratropium-albuterol (DUONEB) 0.5-2.5 (3) MG/3ML nebulizer solution 3 mL (3 mLs Nebulization Given 01/10/14 2229)  azithromycin (ZITHROMAX) tablet 500 mg (not administered)     EKG Interpretation None      MDM   Final diagnoses:  CAP (community acquired pneumonia)    Patient refuses any further blood draws or IV. Patient with history of hypertension and diabetes. Patient without any wheezing upon arrival here. However did feel much better after albuterol Atrovent nebulizer. Sats always been in the upper 90s. Patient's had suspect sinus tachycardia presumed to be related to the nebulizers. Patient's chest x-ray raises concern for pneumonia. Will treat with Zithromax for a community-acquired pneumonia. Will continue Z-Pak as an outpatient. Will continue albuterol inhalers. Patient will return for any new or worse symptoms.  Clinically doubt that this is a pulmonary embolus. Patient improved significantly with nebulizer treatment. Also chest x-ray more consistent with a pneumonia. Patient does not have a leukocytosis. Patient's blood sugar elevated moderately in the 200 range.  Not able to do electrolytes patient refused.  Patient also with a history of hypertension. Has not had her blood pressure medicines today. Patient encouraged to restart them.  I personally performed the services described in this documentation, which was scribed in my presence. The recorded information has been reviewed and is accurate.      Fredia Sorrow, MD 01/10/14 2351

## 2014-01-10 NOTE — ED Notes (Addendum)
Pt c/o " asthma attack " x 1 hr no relief with home inhaler

## 2014-01-10 NOTE — Discharge Instructions (Signed)
Chest x-ray again suspicious for pneumonia. Take antibiotic as directed. Would expect you to get better in a couple days if not followup. Return for any new or worse symptoms. Also recommend using your albuterol inhaler 2 puffs every 6 hours at least for the next 7 days.

## 2014-01-10 NOTE — ED Notes (Signed)
Pt stuck x 2 without success for IV start and blood draw. Pt now refusing to be stuck further at this time. EDP notified of the same

## 2014-01-24 ENCOUNTER — Encounter: Payer: Self-pay | Admitting: Emergency Medicine

## 2014-01-24 NOTE — Progress Notes (Signed)
Patient ID: Rhonda Baldwin, female   DOB: 09/28/1980, 33 y.o.   MRN: [id number] Pt comes in today with c/o freq non productive cough that worsens at bedtime. Pt was recently seen @ [facility] diagnosed with CAP. Pt was given Z-pak and albuterol inhaler with instructions to f/u with PCP in 7 days if no improvement. Pt states she is feeling better,denies chest pain or sob,n,v,fever. Pt also left request forms for leave of absence from employer. Instructed pt to take otc anti-histamine or decongestant for phlegm and cough medicine. Pt instructed if she develops sob or worsening cough over the weekend,go to Er.

## 2014-01-24 NOTE — Patient Instructions (Signed)
Pt instructed to take OTC anti-histamine or decongestant for cough and go to ER if she develop worsening cough with sob or chest pain.

## 2014-04-17 ENCOUNTER — Encounter: Payer: Self-pay | Admitting: Internal Medicine

## 2014-04-17 ENCOUNTER — Ambulatory Visit: Payer: 59 | Attending: Internal Medicine | Admitting: Internal Medicine

## 2014-04-17 VITALS — BP 150/86 | HR 100 | Temp 98.1°F | Resp 22 | Ht 62.0 in | Wt 258.4 lb

## 2014-04-17 DIAGNOSIS — E119 Type 2 diabetes mellitus without complications: Secondary | ICD-10-CM | POA: Insufficient documentation

## 2014-04-17 DIAGNOSIS — Z23 Encounter for immunization: Secondary | ICD-10-CM | POA: Diagnosis not present

## 2014-04-17 DIAGNOSIS — I10 Essential (primary) hypertension: Secondary | ICD-10-CM | POA: Insufficient documentation

## 2014-04-17 DIAGNOSIS — E0590 Thyrotoxicosis, unspecified without thyrotoxic crisis or storm: Secondary | ICD-10-CM | POA: Insufficient documentation

## 2014-04-17 DIAGNOSIS — Z791 Long term (current) use of non-steroidal anti-inflammatories (NSAID): Secondary | ICD-10-CM | POA: Insufficient documentation

## 2014-04-17 DIAGNOSIS — Z794 Long term (current) use of insulin: Secondary | ICD-10-CM | POA: Insufficient documentation

## 2014-04-17 DIAGNOSIS — E1165 Type 2 diabetes mellitus with hyperglycemia: Secondary | ICD-10-CM

## 2014-04-17 LAB — POCT GLYCOSYLATED HEMOGLOBIN (HGB A1C): Hemoglobin A1C: 9.3

## 2014-04-17 LAB — GLUCOSE, POCT (MANUAL RESULT ENTRY): POC Glucose: 184 mg/dl — AB (ref 70–99)

## 2014-04-17 MED ORDER — LISINOPRIL 20 MG PO TABS: 20.0000 mg | ORAL_TABLET | Freq: Every day | ORAL | Status: DC

## 2014-04-17 MED ORDER — ATENOLOL 50 MG PO TABS: 75.0000 mg | ORAL_TABLET | Freq: Every day | ORAL | Status: DC

## 2014-04-17 MED ORDER — METHIMAZOLE 10 MG PO TABS: 20.0000 mg | ORAL_TABLET | Freq: Every day | ORAL | Status: DC

## 2014-04-17 MED ORDER — HYDROCHLOROTHIAZIDE 12.5 MG PO TABS: 25.0000 mg | ORAL_TABLET | Freq: Every day | ORAL | Status: DC

## 2014-04-17 MED ORDER — INSULIN NPH ISOPHANE & REGULAR (70-30) 100 UNIT/ML ~~LOC~~ SUSP: 20.0000 [IU] | Freq: Two times a day (BID) | SUBCUTANEOUS | Status: DC

## 2014-04-17 NOTE — Progress Notes (Signed)
Patient ID: Rhonda Baldwin, female   DOB: 11/10/1980, 33 y.o.   MRN: [id number]   Rhonda Baldwin, is a 33 y.o. female  [id number]  [id number]  DOB - 08-29-1980  Chief Complaint  Patient presents with  . Follow-up  . Hypertension  . Diabetes        Subjective:   Rhonda Baldwin is a 33 y.o. female here today for a follow up visit. Patient has history of hypertension, diabetes mellitus and thyroid disease here today for follow-up. Patient states that she stopped taking her hydrochlorothiazide since March because she is trying to get pregnant, she would like to restart hydrochlorothiazide and methimazole. He has no new complaint today. Patient has No headache, No chest pain, No abdominal pain - No Nausea, No new weakness tingling or numbness, No Cough - SOB.  Problem  Need for Prophylactic Vaccination and Inoculation Against Influenza  Essential Hypertension    ALLERGIES: Allergies  Allergen Reactions  . Orange Swelling    oranges    PAST MEDICAL HISTORY: Past Medical History  Diagnosis Date  . Hypertension   . Diabetes mellitus   . Thyroid disease     MEDICATIONS AT HOME: Prior to Admission medications   Medication Sig Start Date End Date Taking? Authorizing Provider  albuterol (PROVENTIL HFA;VENTOLIN HFA) 108 (90 BASE) MCG/ACT inhaler Inhale 1-2 puffs into the lungs every 6 (six) hours as needed for wheezing or shortness of breath. 01/10/14  Yes Fredia Sorrow, MD  atenolol (TENORMIN) 50 MG tablet Take 1.5 tablets (75 mg total) by mouth daily. 04/17/14  Yes Tresa Garter, MD  glucose blood test strip Use as instructed 08/23/13  Yes Reyne Dumas, MD  glucose monitoring kit (FREESTYLE) monitoring kit 1 each by Does not apply route as needed for other. 08/23/13  Yes Reyne Dumas, MD  insulin NPH-regular Human (NOVOLIN 70/30) (70-30) 100 UNIT/ML injection Inject 20 Units into the skin 2 (two) times daily with a meal. 04/17/14  Yes Odai Wimmer E Doreene Burke, MD  lisinopril  (PRINIVIL,ZESTRIL) 20 MG tablet Take 1 tablet (20 mg total) by mouth daily. 04/17/14  Yes Tresa Garter, MD  azithromycin (ZITHROMAX) 250 MG tablet Take 1 tablet (250 mg total) by mouth daily. Take first 2 tablets together, then 1 every day until finished. 01/10/14   Fredia Sorrow, MD  hydrochlorothiazide (HYDRODIURIL) 12.5 MG tablet Take 2 tablets (25 mg total) by mouth daily. 04/17/14   Tresa Garter, MD  methimazole (TAPAZOLE) 10 MG tablet Take 2 tablets (20 mg total) by mouth daily. 04/17/14   Tresa Garter, MD  naproxen sodium (ANAPROX) 220 MG tablet Take 440 mg by mouth 2 (two) times daily with a meal.    Historical Provider, MD     Objective:   Filed Vitals:   04/17/14 1418  BP: 150/86  Pulse: 100  Temp: 98.1 F (36.7 C)  TempSrc: Oral  Resp: 22  Height: '5\' 2"'  (1.575 m)  Weight: 258 lb 6.4 oz (117.209 kg)  SpO2: 100%    Exam General appearance : Awake, alert, not in any distress. Speech Clear. Not toxic looking HEENT: Atraumatic and Normocephalic, pupils equally reactive to light and accomodation Neck: supple, no JVD. No cervical lymphadenopathy.  Chest:Good air entry bilaterally, no added sounds  CVS: S1 S2 regular, no murmurs.  Abdomen: Bowel sounds present, Non tender and not distended with no gaurding, rigidity or rebound. Extremities: B/L Lower Ext shows no edema, both legs are warm to touch Neurology: Awake alert, and oriented X 3,  CN II-XII intact, Non focal Skin:No Rash Wounds:N/A  Data Review Lab Results  Component Value Date   HGBA1C 9.3 04/17/2014   HGBA1C 10 11/28/2013   HGBA1C 9.6 08/23/2013     Assessment & Plan   1. Type 2 diabetes mellitus with hyperglycemia  - Glucose (CBG) - HgB A1c is 9.3% today - insulin NPH-regular Human (NOVOLIN 70/30) (70-30) 100 UNIT/ML injection; Inject 20 Units into the skin 2 (two) times daily with a meal.  Dispense: 3 vial; Refill: 3  2. Need for prophylactic vaccination and inoculation against  influenza Flu vaccine given  3. Primary hyperthyroidism Refill - atenolol (TENORMIN) 50 MG tablet; Take 1.5 tablets (75 mg total) by mouth daily.  Dispense: 90 tablet; Refill: 3 - methimazole (TAPAZOLE) 10 MG tablet; Take 2 tablets (20 mg total) by mouth daily.  Dispense: 90 tablet; Refill: 3  4. Essential hypertension Refill - atenolol (TENORMIN) 50 MG tablet; Take 1.5 tablets (75 mg total) by mouth daily.  Dispense: 90 tablet; Refill: 3 - hydrochlorothiazide (HYDRODIURIL) 12.5 MG tablet; Take 2 tablets (25 mg total) by mouth daily.  Dispense: 90 tablet; Refill: 3 - lisinopril (PRINIVIL,ZESTRIL) 20 MG tablet; Take 1 tablet (20 mg total) by mouth daily.  Dispense: 90 tablet; Refill: 3   Return in about 3 months (around 07/17/2014), or if symptoms worsen or fail to improve, for Hemoglobin A1C and Follow up, DM, Follow up HTN, Hyperthyroidism.  The patient was given clear instructions to go to ER or return to medical center if symptoms don't improve, worsen or new problems develop. The patient verbalized understanding. The patient was told to call to get lab results if they haven't heard anything in the next week.   This note has been created with Surveyor, quantity. Any transcriptional errors are unintentional.    Angelica Chessman, MD, [place], [place], [place] [facility] [place], [place]   04/17/2014, 2:51 PM

## 2014-04-17 NOTE — Progress Notes (Signed)
Patient presents for f/u on HTN and DM States stopped HCTZ in march 2016 when trying to get pregnant. Would like to restart HCTZ and methimazole

## 2014-04-17 NOTE — Patient Instructions (Signed)
Fat and Cholesterol Control Diet Fat and cholesterol levels in your blood and organs are influenced by your diet. High levels of fat and cholesterol may lead to diseases of the heart, small and large blood vessels, gallbladder, liver, and pancreas. CONTROLLING FAT AND CHOLESTEROL WITH DIET Although exercise and lifestyle factors are important, your diet is key. That is because certain foods are known to raise cholesterol and others to lower it. The goal is to balance foods for their effect on cholesterol and more importantly, to replace saturated and trans fat with other types of fat, such as monounsaturated fat, polyunsaturated fat, and omega-3 fatty acids. On average, a person should consume no more than 15 to 17 g of saturated fat daily. Saturated and trans fats are considered "bad" fats, and they will raise LDL cholesterol. Saturated fats are primarily found in animal products such as meats, butter, and cream. However, that does not mean you need to give up all your favorite foods. Today, there are good tasting, low-fat, low-cholesterol substitutes for most of the things you like to eat. Choose low-fat or nonfat alternatives. Choose round or loin cuts of red meat. These types of cuts are lowest in fat and cholesterol. Chicken (without the skin), fish, veal, and ground turkey breast are great choices. Eliminate fatty meats, such as hot dogs and salami. Even shellfish have little or no saturated fat. Have a 3 oz (85 g) portion when you eat lean meat, poultry, or fish. Trans fats are also called "partially hydrogenated oils." They are oils that have been scientifically manipulated so that they are solid at room temperature resulting in a longer shelf life and improved taste and texture of foods in which they are added. Trans fats are found in stick margarine, some tub margarines, cookies, crackers, and baked goods.  When baking and cooking, oils are a great substitute for butter. The monounsaturated oils are  especially beneficial since it is believed they lower LDL and raise HDL. The oils you should avoid entirely are saturated tropical oils, such as coconut and palm.  Remember to eat a lot from food groups that are naturally free of saturated and trans fat, including fish, fruit, vegetables, beans, grains (barley, rice, couscous, bulgur wheat), and pasta (without cream sauces).  IDENTIFYING FOODS THAT LOWER FAT AND CHOLESTEROL  Soluble fiber may lower your cholesterol. This type of fiber is found in fruits such as apples, vegetables such as broccoli, potatoes, and carrots, legumes such as beans, peas, and lentils, and grains such as barley. Foods fortified with plant sterols (phytosterol) may also lower cholesterol. You should eat at least 2 g per day of these foods for a cholesterol lowering effect.  Read package labels to identify low-saturated fats, trans fat free, and low-fat foods at the supermarket. Select cheeses that have only 2 to 3 g saturated fat per ounce. Use a heart-healthy tub margarine that is free of trans fats or partially hydrogenated oil. When buying baked goods (cookies, crackers), avoid partially hydrogenated oils. Breads and muffins should be made from whole grains (whole-wheat or whole oat flour, instead of "flour" or "enriched flour"). Buy non-creamy canned soups with reduced salt and no added fats.  FOOD PREPARATION TECHNIQUES  Never deep-fry. If you must fry, either stir-fry, which uses very little fat, or use non-stick cooking sprays. When possible, broil, bake, or roast meats, and steam vegetables. Instead of putting butter or margarine on vegetables, use lemon and herbs, applesauce, and cinnamon (for squash and sweet potatoes). Use nonfat   yogurt, salsa, and low-fat dressings for salads.  LOW-SATURATED FAT / LOW-FAT FOOD SUBSTITUTES Meats / Saturated Fat (g)  Avoid: Steak, marbled (3 oz/85 g) / 11 g  Choose: Steak, lean (3 oz/85 g) / 4 g  Avoid: Hamburger (3 oz/85 g) / 7  g  Choose: Hamburger, lean (3 oz/85 g) / 5 g  Avoid: Ham (3 oz/85 g) / 6 g  Choose: Ham, lean cut (3 oz/85 g) / 2.4 g  Avoid: Chicken, with skin, dark meat (3 oz/85 g) / 4 g  Choose: Chicken, skin removed, dark meat (3 oz/85 g) / 2 g  Avoid: Chicken, with skin, light meat (3 oz/85 g) / 2.5 g  Choose: Chicken, skin removed, light meat (3 oz/85 g) / 1 g Dairy / Saturated Fat (g)  Avoid: Whole milk (1 cup) / 5 g  Choose: Low-fat milk, 2% (1 cup) / 3 g  Choose: Low-fat milk, 1% (1 cup) / 1.5 g  Choose: Skim milk (1 cup) / 0.3 g  Avoid: Hard cheese (1 oz/28 g) / 6 g  Choose: Skim milk cheese (1 oz/28 g) / 2 to 3 g  Avoid: Cottage cheese, 4% fat (1 cup) / 6.5 g  Choose: Low-fat cottage cheese, 1% fat (1 cup) / 1.5 g  Avoid: Ice cream (1 cup) / 9 g  Choose: Sherbet (1 cup) / 2.5 g  Choose: Nonfat frozen yogurt (1 cup) / 0.3 g  Choose: Frozen fruit bar / trace  Avoid: Whipped cream (1 tbs) / 3.5 g  Choose: Nondairy whipped topping (1 tbs) / 1 g Condiments / Saturated Fat (g)  Avoid: Mayonnaise (1 tbs) / 2 g  Choose: Low-fat mayonnaise (1 tbs) / 1 g  Avoid: Butter (1 tbs) / 7 g  Choose: Extra light margarine (1 tbs) / 1 g  Avoid: Coconut oil (1 tbs) / 11.8 g  Choose: Olive oil (1 tbs) / 1.8 g  Choose: Corn oil (1 tbs) / 1.7 g  Choose: Safflower oil (1 tbs) / 1.2 g  Choose: Sunflower oil (1 tbs) / 1.4 g  Choose: Soybean oil (1 tbs) / 2.4 g  Choose: Canola oil (1 tbs) / 1 g Document Released: 08/01/2005 Document Revised: 11/26/2012 Document Reviewed: 10/30/2013 [employer] Patient Information 2015 [place], [place]. This information is not intended to replace advice given to you by your health care provider. Make sure you discuss any questions you have with your health care provider. Diabetes Mellitus and Food It is important for you to manage your blood sugar (glucose) level. Your blood glucose level can be greatly affected by what you eat. Eating healthier  foods in the appropriate amounts throughout the day at about the same time each day will help you control your blood glucose level. It can also help slow or prevent worsening of your diabetes mellitus. Healthy eating may even help you improve the level of your blood pressure and reach or maintain a healthy weight.  HOW CAN FOOD AFFECT ME? Carbohydrates Carbohydrates affect your blood glucose level more than any other type of food. Your dietitian will help you determine how many carbohydrates to eat at each meal and teach you how to count carbohydrates. Counting carbohydrates is important to keep your blood glucose at a healthy level, especially if you are using insulin or taking certain medicines for diabetes mellitus. Alcohol Alcohol can cause sudden decreases in blood glucose (hypoglycemia), especially if you use insulin or take certain medicines for diabetes mellitus. Hypoglycemia can be a life-threatening  condition. Symptoms of hypoglycemia (sleepiness, dizziness, and disorientation) are similar to symptoms of having too much alcohol.  If your health care provider has given you approval to drink alcohol, do so in moderation and use the following guidelines:  Women should not have more than one drink per day, and men should not have more than two drinks per day. One drink is equal to:  12 oz of beer.  5 oz of wine.  1 oz of hard liquor.  Do not drink on an empty stomach.  Keep yourself hydrated. Have water, diet soda, or unsweetened iced tea.  Regular soda, juice, and other mixers might contain a lot of carbohydrates and should be counted. WHAT FOODS ARE NOT RECOMMENDED? As you make food choices, it is important to remember that all foods are not the same. Some foods have fewer nutrients per serving than other foods, even though they might have the same number of calories or carbohydrates. It is difficult to get your body what it needs when you eat foods with fewer nutrients. Examples of  foods that you should avoid that are high in calories and carbohydrates but low in nutrients include:  Trans fats (most processed foods list trans fats on the Nutrition Facts label).  Regular soda.  Juice.  Candy.  Sweets, such as cake, pie, doughnuts, and cookies.  Fried foods. WHAT FOODS CAN I EAT? Have nutrient-rich foods, which will nourish your body and keep you healthy. The food you should eat also will depend on several factors, including:  The calories you need.  The medicines you take.  Your weight.  Your blood glucose level.  Your blood pressure level.  Your cholesterol level. You also should eat a variety of foods, including:  Protein, such as meat, poultry, fish, tofu, nuts, and seeds (lean animal proteins are best).  Fruits.  Vegetables.  Dairy products, such as milk, cheese, and yogurt (low fat is best).  Breads, grains, pasta, cereal, rice, and beans.  Fats such as olive oil, trans fat-free margarine, canola oil, avocado, and olives. DOES EVERYONE WITH DIABETES MELLITUS HAVE THE SAME MEAL PLAN? Because every person with diabetes mellitus is different, there is not one meal plan that works for everyone. It is very important that you meet with a dietitian who will help you create a meal plan that is just right for you. Document Released: 04/28/2005 Document Revised: 08/06/2013 Document Reviewed: 06/28/2013 [facility] Patient Information 2015 [place], [place]. This information is not intended to replace advice given to you by your health care provider. Make sure you discuss any questions you have with your health care provider. Diabetes and Exercise Exercising regularly is important. It is not just about losing weight. It has many health benefits, such as:  Improving your overall fitness, flexibility, and endurance.  Increasing your bone density.  Helping with weight control.  Decreasing your body fat.  Increasing your muscle strength.  Reducing  stress and tension.  Improving your overall health. People with diabetes who exercise gain additional benefits because exercise:  Reduces appetite.  Improves the body's use of blood sugar (glucose).  Helps lower or control blood glucose.  Decreases blood pressure.  Helps control blood lipids (such as cholesterol and triglycerides).  Improves the body's use of the hormone insulin by:  Increasing the body's insulin sensitivity.  Reducing the body's insulin needs.  Decreases the risk for heart disease because exercising:  Lowers cholesterol and triglycerides levels.  Increases the levels of good cholesterol (such as high-density lipoproteins [  HDL]) in the body.  Lowers blood glucose levels. YOUR ACTIVITY PLAN  Choose an activity that you enjoy and set realistic goals. Your health care provider or diabetes educator can help you make an activity plan that works for you. Exercise regularly as directed by your health care provider. This includes:  Performing resistance training twice a week such as push-ups, sit-ups, lifting weights, or using resistance bands.  Performing 150 minutes of cardio exercises each week such as walking, running, or playing sports.  Staying active and spending no more than 90 minutes at one time being inactive. Even short bursts of exercise are good for you. Three 10-minute sessions spread throughout the day are just as beneficial as a single 30-minute session. Some exercise ideas include:  Taking the dog for a walk.  Taking the stairs instead of the elevator.  Dancing to your favorite song.  Doing an exercise video.  Doing your favorite exercise with a friend. RECOMMENDATIONS FOR EXERCISING WITH TYPE 1 OR TYPE 2 DIABETES   Check your blood glucose before exercising. If blood glucose levels are greater than 240 mg/dL, check for urine ketones. Do not exercise if ketones are present.  Avoid injecting insulin into areas of the body that are going to  be exercised. For example, avoid injecting insulin into:  The arms when playing tennis.  The legs when jogging.  Keep a record of:  Food intake before and after you exercise.  Expected peak times of insulin action.  Blood glucose levels before and after you exercise.  The type and amount of exercise you have done.  Review your records with your health care provider. Your health care provider will help you to develop guidelines for adjusting food intake and insulin amounts before and after exercising.  If you take insulin or oral hypoglycemic agents, watch for signs and symptoms of hypoglycemia. They include:  Dizziness.  Shaking.  Sweating.  Chills.  Confusion.  Drink plenty of water while you exercise to prevent dehydration or heat stroke. Body water is lost during exercise and must be replaced.  Talk to your health care provider before starting an exercise program to make sure it is safe for you. Remember, almost any type of activity is better than none. Document Released: 10/22/2003 Document Revised: 12/16/2013 Document Reviewed: 01/08/2013 [facility] Patient Information 2015 [place], [place]. This information is not intended to replace advice given to you by your health care provider. Make sure you discuss any questions you have with your health care provider. DASH Eating Plan DASH stands for "Dietary Approaches to Stop Hypertension." The DASH eating plan is a healthy eating plan that has been shown to reduce high blood pressure (hypertension). Additional health benefits may include reducing the risk of type 2 diabetes mellitus, heart disease, and stroke. The DASH eating plan may also help with weight loss. WHAT DO I NEED TO KNOW ABOUT THE DASH EATING PLAN? For the DASH eating plan, you will follow these general guidelines:  Choose foods with a percent daily value for sodium of less than 5% (as listed on the food label).  Use salt-free seasonings or herbs instead of table  salt or sea salt.  Check with your health care provider or pharmacist before using salt substitutes.  Eat lower-sodium products, often labeled as "lower sodium" or "no salt added."  Eat fresh foods.  Eat more vegetables, fruits, and low-fat dairy products.  Choose whole grains. Look for the word "whole" as the first word in the ingredient list.  Choose  fish and skinless chicken or [place] more often than red meat. Limit fish, poultry, and meat to 6 oz (170 g) each day.  Limit sweets, desserts, sugars, and sugary drinks.  Choose heart-healthy fats.  Limit cheese to 1 oz (28 g) per day.  Eat more home-cooked food and less restaurant, buffet, and fast food.  Limit fried foods.  Cook foods using methods other than frying.  Limit canned vegetables. If you do use them, rinse them well to decrease the sodium.  When eating at a restaurant, ask that your food be prepared with less salt, or no salt if possible. WHAT FOODS CAN I EAT? Seek help from a dietitian for individual calorie needs. Grains Whole grain or whole wheat bread. Brown rice. Whole grain or whole wheat pasta. Quinoa, bulgur, and whole grain cereals. Low-sodium cereals. Corn or whole wheat flour tortillas. Whole grain cornbread. Whole grain crackers. Low-sodium crackers. Vegetables Fresh or frozen vegetables (raw, steamed, roasted, or grilled). Low-sodium or reduced-sodium tomato and vegetable juices. Low-sodium or reduced-sodium tomato sauce and paste. Low-sodium or reduced-sodium canned vegetables.  Fruits All fresh, canned (in natural juice), or frozen fruits. Meat and Other Protein Products Ground beef (85% or leaner), grass-fed beef, or beef trimmed of fat. Skinless chicken or [place]. Ground chicken or [place]. Pork trimmed of fat. All fish and seafood. Eggs. Dried beans, peas, or lentils. Unsalted nuts and seeds. Unsalted canned beans. Dairy Low-fat dairy products, such as skim or 1% milk, 2% or reduced-fat cheeses,  low-fat ricotta or cottage cheese, or plain low-fat yogurt. Low-sodium or reduced-sodium cheeses. Fats and Oils Tub margarines without trans fats. Light or reduced-fat mayonnaise and salad dressings (reduced sodium). Avocado. Safflower, olive, or canola oils. Natural peanut or almond butter. Other Unsalted popcorn and pretzels. The items listed above may not be a complete list of recommended foods or beverages. Contact your dietitian for more options. WHAT FOODS ARE NOT RECOMMENDED? Grains White bread. White pasta. White rice. Refined cornbread. Bagels and croissants. Crackers that contain trans fat. Vegetables Creamed or fried vegetables. Vegetables in a cheese sauce. Regular canned vegetables. Regular canned tomato sauce and paste. Regular tomato and vegetable juices. Fruits Dried fruits. Canned fruit in light or heavy syrup. Fruit juice. Meat and Other Protein Products Fatty cuts of meat. Ribs, chicken wings, bacon, sausage, bologna, salami, chitterlings, fatback, hot dogs, bratwurst, and packaged luncheon meats. Salted nuts and seeds. Canned beans with salt. Dairy Whole or 2% milk, cream, half-and-half, and cream cheese. Whole-fat or sweetened yogurt. Full-fat cheeses or blue cheese. Nondairy creamers and whipped toppings. Processed cheese, cheese spreads, or cheese curds. Condiments Onion and garlic salt, seasoned salt, table salt, and sea salt. Canned and packaged gravies. Worcestershire sauce. Tartar sauce. Barbecue sauce. Teriyaki sauce. Soy sauce, including reduced sodium. Steak sauce. Fish sauce. Oyster sauce. Cocktail sauce. Horseradish. Ketchup and mustard. Meat flavorings and tenderizers. Bouillon cubes. Hot sauce. Tabasco sauce. Marinades. Taco seasonings. Relishes. Fats and Oils Butter, stick margarine, lard, shortening, ghee, and bacon fat. Coconut, palm kernel, or palm oils. Regular salad dressings. Other Pickles and olives. Salted popcorn and pretzels. The items listed above  may not be a complete list of foods and beverages to avoid. Contact your dietitian for more information. WHERE CAN I FIND MORE INFORMATION? National Heart, [employer], [facility] [employer]: [email or web address] Document Released: 07/21/2011 Document Revised: 12/16/2013 Document Reviewed: 06/05/2013 [facility] Patient Information 2015 [place], [place]. This information is not intended to replace advice given to you by your health care provider. Make sure you  discuss any questions you have with your health care provider. Hypertension Hypertension, commonly called high blood pressure, is when the force of blood pumping through your arteries is too strong. Your arteries are the blood vessels that carry blood from your heart throughout your body. A blood pressure reading consists of a higher number over a lower number, such as 110/72. The higher number (systolic) is the pressure inside your arteries when your heart pumps. The lower number (diastolic) is the pressure inside your arteries when your heart relaxes. Ideally you want your blood pressure below 120/80. Hypertension forces your heart to work harder to pump blood. Your arteries may become narrow or stiff. Having hypertension puts you at risk for heart disease, stroke, and other problems.  RISK FACTORS Some risk factors for high blood pressure are controllable. Others are not.  Risk factors you cannot control include:   Race. You may be at higher risk if you are African American.  Age. Risk increases with age.  Gender. Men are at higher risk than women before age 58 years. After age 71, women are at higher risk than men. Risk factors you can control include:  Not getting enough exercise or physical activity.  Being overweight.  Getting too much fat, sugar, calories, or salt in your diet.  Drinking too much alcohol. SIGNS AND SYMPTOMS Hypertension does not usually cause signs or symptoms. Extremely high blood pressure  (hypertensive crisis) may cause headache, anxiety, shortness of breath, and nosebleed. DIAGNOSIS  To check if you have hypertension, your health care provider will measure your blood pressure while you are seated, with your arm held at the level of your heart. It should be measured at least twice using the same arm. Certain conditions can cause a difference in blood pressure between your right and left arms. A blood pressure reading that is higher than normal on one occasion does not mean that you need treatment. If one blood pressure reading is high, ask your health care provider about having it checked again. TREATMENT  Treating high blood pressure includes making lifestyle changes and possibly taking medicine. Living a healthy lifestyle can help lower high blood pressure. You may need to change some of your habits. Lifestyle changes may include:  Following the DASH diet. This diet is high in fruits, vegetables, and whole grains. It is low in salt, red meat, and added sugars.  Getting at least 2 hours of brisk physical activity every week.  Losing weight if necessary.  Not smoking.  Limiting alcoholic beverages.  Learning ways to reduce stress. If lifestyle changes are not enough to get your blood pressure under control, your health care provider may prescribe medicine. You may need to take more than one. Work closely with your health care provider to understand the risks and benefits. HOME CARE INSTRUCTIONS  Have your blood pressure rechecked as directed by your health care provider.   Take medicines only as directed by your health care provider. Follow the directions carefully. Blood pressure medicines must be taken as prescribed. The medicine does not work as well when you skip doses. Skipping doses also puts you at risk for problems.   Do not smoke.   Monitor your blood pressure at home as directed by your health care provider. SEEK MEDICAL CARE IF:   You think you are  having a reaction to medicines taken.  You have recurrent headaches or feel dizzy.  You have swelling in your ankles.  You have trouble with your vision. SEEK IMMEDIATE  MEDICAL CARE IF:  You develop a severe headache or confusion.  You have unusual weakness, numbness, or feel faint.  You have severe chest or abdominal pain.  You vomit repeatedly.  You have trouble breathing. MAKE SURE YOU:   Understand these instructions.  Will watch your condition.  Will get help right away if you are not doing well or get worse. Document Released: 08/01/2005 Document Revised: 12/16/2013 Document Reviewed: 05/24/2013 [facility] Patient Information 2015 [place], [place]. This information is not intended to replace advice given to you by your health care provider. Make sure you discuss any questions you have with your health care provider.

## 2014-06-01 ENCOUNTER — Encounter (HOSPITAL_BASED_OUTPATIENT_CLINIC_OR_DEPARTMENT_OTHER): Payer: Self-pay | Admitting: Emergency Medicine

## 2014-06-01 ENCOUNTER — Emergency Department (HOSPITAL_BASED_OUTPATIENT_CLINIC_OR_DEPARTMENT_OTHER)
Admission: EM | Admit: 2014-06-01 | Discharge: 2014-06-01 | Disposition: A | Discharge status: Home / Self Care | Payer: 59 | Attending: Emergency Medicine | Admitting: Emergency Medicine

## 2014-06-01 DIAGNOSIS — M79645 Pain in left finger(s): Secondary | ICD-10-CM | POA: Diagnosis present

## 2014-06-01 DIAGNOSIS — E119 Type 2 diabetes mellitus without complications: Secondary | ICD-10-CM | POA: Diagnosis not present

## 2014-06-01 DIAGNOSIS — M65342 Trigger finger, left ring finger: Secondary | ICD-10-CM | POA: Diagnosis not present

## 2014-06-01 DIAGNOSIS — Z791 Long term (current) use of non-steroidal anti-inflammatories (NSAID): Secondary | ICD-10-CM | POA: Diagnosis not present

## 2014-06-01 DIAGNOSIS — Z79899 Other long term (current) drug therapy: Secondary | ICD-10-CM | POA: Diagnosis not present

## 2014-06-01 DIAGNOSIS — Z794 Long term (current) use of insulin: Secondary | ICD-10-CM | POA: Insufficient documentation

## 2014-06-01 DIAGNOSIS — I10 Essential (primary) hypertension: Secondary | ICD-10-CM | POA: Insufficient documentation

## 2014-06-01 MED ORDER — HYDROCODONE-ACETAMINOPHEN 5-325 MG PO TABS: 1.0000 | ORAL_TABLET | ORAL | Status: DC | PRN

## 2014-06-01 MED ORDER — LIDOCAINE HCL 2 % IJ SOLN
5.0000 mL | Freq: Once | INTRAMUSCULAR | Status: AC
  Administered 2014-06-01: 100 mg
  Filled 2014-06-01: qty 20

## 2014-06-01 NOTE — ED Provider Notes (Signed)
CSN: [id number]     Arrival date & time 06/01/14  1154 History   First MD Initiated Contact with Patient 06/01/14 1228     Chief Complaint  Patient presents with  . Finger Injury     (Consider location/radiation/quality/duration/timing/severity/associated sxs/prior Treatment) HPI Comments: This is a 33 year old female with a past medical history of hypertension, diabetes and thyroid disease who presents to the emergency department complaining of left ring finger pain with movement beginning earlier today while she was at work. Patient reports she cannot straighten out her finger. She had this happen to her this past December when she had it injected and was able to straighten it out. She then followed up with Dr. Lenon Curt who did not do any further intervention and discussed trigger finger. 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 No injury or trauma. Any movement causes pain. Denies numbness or tingling.  The history is provided by the patient.    Past Medical History  Diagnosis Date  . Hypertension   . Diabetes mellitus   . Thyroid disease    Past Surgical History  Procedure Laterality Date  . Cholecystectomy    . Cesarean section     Family History  Problem Relation Age of Onset  . Hypertension Mother   . Hypertension Father    History  Substance Use Topics  . Smoking status: Never Smoker   . Smokeless tobacco: Never Used  . Alcohol Use: No   OB History   Grav Para Term Preterm Abortions TAB SAB Ect Mult Living                 Review of Systems  Constitutional: Negative.   Respiratory: Negative.   Cardiovascular: Negative.   Musculoskeletal:       + L ring finger pain.  Skin: Negative for color change.  Neurological: Negative for numbness.      Allergies  Orange  Home Medications   Prior to Admission medications   Medication Sig Start Date End Date Taking? Authorizing Provider  atenolol (TENORMIN) 50  MG tablet Take 1.5 tablets (75 mg total) by mouth daily. 04/17/14  Yes Tresa Garter, MD  glucose blood test strip Use as instructed 08/23/13  Yes Reyne Dumas, MD  glucose monitoring kit (FREESTYLE) monitoring kit 1 each by Does not apply route as needed for other. 08/23/13  Yes Reyne Dumas, MD  hydrochlorothiazide (HYDRODIURIL) 12.5 MG tablet Take 2 tablets (25 mg total) by mouth daily. 04/17/14  Yes Tresa Garter, MD  insulin NPH-regular Human (NOVOLIN 70/30) (70-30) 100 UNIT/ML injection Inject 20 Units into the skin 2 (two) times daily with a meal. 04/17/14  Yes Olugbemiga E Doreene Burke, MD  lisinopril (PRINIVIL,ZESTRIL) 20 MG tablet Take 1 tablet (20 mg total) by mouth daily. 04/17/14  Yes Tresa Garter, MD  methimazole (TAPAZOLE) 10 MG tablet Take 2 tablets (20 mg total) by mouth daily. 04/17/14  Yes Tresa Garter, MD  albuterol (PROVENTIL HFA;VENTOLIN HFA) 108 (90 BASE) MCG/ACT inhaler Inhale 1-2 puffs into the lungs every 6 (six) hours as needed for wheezing or shortness of breath. 01/10/14   Fredia Sorrow, MD  azithromycin (ZITHROMAX) 250 MG tablet Take 1 tablet (250 mg total) by mouth daily. Take first 2 tablets together, then 1 every day until finished. 01/10/14   Fredia Sorrow, MD  HYDROcodone-acetaminophen (NORCO/VICODIN) 5-325 MG per tablet Take 1-2 tablets by mouth every 4 (four) hours as needed. 06/01/14  Greg Cratty M Mordechai Matuszak, PA-C  naproxen sodium (ANAPROX) 220 MG tablet Take 440 mg by mouth 2 (two) times daily with a meal.    Historical Provider, MD   BP 150/85  Pulse 80  Temp(Src) 98 F (36.7 C) (Oral)  Resp 18  Ht '5\' 3"'  (1.6 m)  Wt 250 lb (113.399 kg)  BMI 44.30 kg/m2  SpO2 100%  LMP 05/30/2014 Physical Exam  Nursing note and vitals reviewed. Constitutional: She is oriented to person, place, and time. She appears well-developed and well-nourished. No distress.  HENT:  Head: Normocephalic and atraumatic.  Mouth/Throat: Oropharynx is clear and moist.  Eyes:  Conjunctivae and EOM are normal.  Neck: Normal range of motion. Neck supple.  Cardiovascular: Normal rate, regular rhythm and normal heart sounds.   Pulmonary/Chest: Effort normal and breath sounds normal. No respiratory distress.  Musculoskeletal: She exhibits no edema.  L ring finger flexed at [place], PIP and DIP. All other fingers with FROM. No swelling, erythema. Cap refill < 3 seconds. Sensation intact. Pain with any movement of ring finger.  Neurological: She is alert and oriented to person, place, and time. No sensory deficit.  Skin: Skin is warm and dry.  Psychiatric: She has a normal mood and affect. Her behavior is normal.    ED Course  NERVE BLOCK Date/Time: 06/01/2014 12:53 PM Performed by: Carman Ching Authorized by: Carman Ching Consent: Verbal consent obtained. Risks and benefits: risks, benefits and alternatives were discussed Consent given by: patient Indications: pain relief Body area: upper extremity Nerve: digital Laterality: left Needle gauge: 25 G Location technique: anatomical landmarks Local anesthetic: lidocaine 2% without epinephrine Anesthetic total: 4 ml Outcome: pain improved Comments: Ring finger no longer flexed.   (including critical care time) Labs Review Labs Reviewed - No data to display  Imaging Review No results found.   EKG Interpretation None      MDM   Final diagnoses:  Trigger ring finger, left   Neurovascularly intact. Digital block performed with successful improvement of trigger finger. Finger splinted in extension. Followup with Dr. Lenon Curt who she has seen in the past. Stable for discharge. Return precautions given. Patient states understanding of treatment care plan and is agreeable.   Carman Ching, PA-C 06/01/14 1255

## 2014-06-01 NOTE — Discharge Instructions (Signed)
Take Vicodin for severe pain only. No driving or operating heavy machinery while taking vicodin. This medication may cause drowsiness. Follow up with Dr. Izora Ribasoley. Stay in the splint until follow up.  Trigger Finger Trigger finger (digital tendinitis and stenosing tenosynovitis) is a common disorder that causes an often painful catching of the fingers or thumb. It occurs as a clicking, snapping, or locking of a finger in the palm of the hand. This is caused by a problem with the tendons that flex or bend the fingers sliding smoothly through their sheaths. The condition may occur in any finger or a couple fingers at the same time.  The finger may lock with the finger curled or suddenly straighten out with a snap. This is more common in patients with rheumatoid arthritis and diabetes. Left untreated, the condition may get worse to the point where the finger becomes locked in flexion, like making a fist, or less commonly locked with the finger straightened out. CAUSES   Inflammation and scarring that lead to swelling around the tendon sheath.  Repeated or forceful movements.  Rheumatoid arthritis, an autoimmune disease that affects joints.  Gout.  Diabetes mellitus. SIGNS AND SYMPTOMS  Soreness and swelling of your finger.  A painful clicking or snapping as you bend and straighten your finger. DIAGNOSIS  Your health care provider will do a physical exam of your finger to diagnose trigger finger. TREATMENT   Splinting for 6-8 weeks may be helpful.  Nonsteroidal anti-inflammatory medicines (NSAIDs) can help to relieve the pain and inflammation.  Cortisone injections, along with splinting, may speed up recovery. Several injections may be required. Cortisone may give relief after one injection.  Surgery is another treatment that may be used if conservative treatments do not work. Surgery can be minor, without incisions (a cut does not have to be made), and can be done with a needle through the  skin.  Other surgical choices involve an open procedure in which the surgeon opens the hand through a small incision and cuts the pulley so the tendon can again slide smoothly. Your hand will still work fine. HOME CARE INSTRUCTIONS  Apply ice to the injured area, twice per day:  Put ice in a plastic bag.  Place a towel between your skin and the bag.  Leave the ice on for 20 minutes, 3-4 times a day.  Rest your hand often. MAKE SURE YOU:   Understand these instructions.  Will watch your condition.  Will get help right away if you are not doing well or get worse. Document Released: 05/21/2004 Document Revised: 04/03/2013 Document Reviewed: 01/01/2013 [facility] Patient Information 2015 [place], [place]. This information is not intended to replace advice given to you by your health care provider. Make sure you discuss any questions you have with your health care provider.

## 2014-06-01 NOTE — ED Notes (Signed)
Pt presents to ED with complaints of unable to move left hand ring finger.

## 2014-06-03 ENCOUNTER — Encounter (HOSPITAL_COMMUNITY): Payer: Self-pay | Admitting: Emergency Medicine

## 2014-06-03 ENCOUNTER — Emergency Department (INDEPENDENT_AMBULATORY_CARE_PROVIDER_SITE_OTHER)
Admission: EM | Admit: 2014-06-03 | Discharge: 2014-06-03 | Disposition: A | Discharge status: Home / Self Care | Payer: 59 | Source: Home / Self Care | Attending: Family Medicine | Admitting: Family Medicine

## 2014-06-03 DIAGNOSIS — M65342 Trigger finger, left ring finger: Secondary | ICD-10-CM

## 2014-06-03 NOTE — ED Notes (Signed)
Here  For  followup  Of  Her  l  Ring  Finger  She  States   She  Was   [facility]  [facility]         She  Has  A  Splint in place  On  Arrival

## 2014-06-03 NOTE — Discharge Instructions (Signed)
Wear splint until seen by dr Izora Ribascoley for recheck.

## 2014-06-03 NOTE — ED Provider Notes (Signed)
CSN: [id number]     Arrival date & time 06/03/14  8295 History   First MD Initiated Contact with Patient 06/03/14 [phone]     Chief Complaint  Patient presents with  . Follow-up   (Consider location/radiation/quality/duration/timing/severity/associated sxs/prior Treatment) Patient is a 33 y.o. female presenting with hand pain. The history is provided by the patient.  Hand Pain This is a recurrent problem. The current episode started 2 days ago. The problem has been gradually improving. Associated symptoms comments: Seen at [facility] and had injection and splinting, referred to dr Lenon Curt, needs work release extension..    Past Medical History  Diagnosis Date  . Hypertension   . Diabetes mellitus   . Thyroid disease    Past Surgical History  Procedure Laterality Date  . Cholecystectomy    . Cesarean section     Family History  Problem Relation Age of Onset  . Hypertension Mother   . Hypertension Father    History  Substance Use Topics  . Smoking status: Never Smoker   . Smokeless tobacco: Never Used  . Alcohol Use: No   OB History   Grav Para Term Preterm Abortions TAB SAB Ect Mult Living                 Review of Systems  Constitutional: Negative.   Musculoskeletal: Positive for joint swelling.  Skin: Negative.     Allergies  Orange  Home Medications   Prior to Admission medications   Medication Sig Start Date End Date Taking? Authorizing Provider  albuterol (PROVENTIL HFA;VENTOLIN HFA) 108 (90 BASE) MCG/ACT inhaler Inhale 1-2 puffs into the lungs every 6 (six) hours as needed for wheezing or shortness of breath. 01/10/14   Fredia Sorrow, MD  atenolol (TENORMIN) 50 MG tablet Take 1.5 tablets (75 mg total) by mouth daily. 04/17/14   Tresa Garter, MD  azithromycin (ZITHROMAX) 250 MG tablet Take 1 tablet (250 mg total) by mouth daily. Take first 2 tablets together, then 1 every day until finished. 01/10/14   Fredia Sorrow, MD  glucose blood test strip Use as  instructed 08/23/13   Reyne Dumas, MD  glucose monitoring kit (FREESTYLE) monitoring kit 1 each by Does not apply route as needed for other. 08/23/13   Reyne Dumas, MD  hydrochlorothiazide (HYDRODIURIL) 12.5 MG tablet Take 2 tablets (25 mg total) by mouth daily. 04/17/14   Tresa Garter, MD  HYDROcodone-acetaminophen (NORCO/VICODIN) 5-325 MG per tablet Take 1-2 tablets by mouth every 4 (four) hours as needed. 06/01/14   Robyn M Hess, PA-C  insulin NPH-regular Human (NOVOLIN 70/30) (70-30) 100 UNIT/ML injection Inject 20 Units into the skin 2 (two) times daily with a meal. 04/17/14   Tresa Garter, MD  lisinopril (PRINIVIL,ZESTRIL) 20 MG tablet Take 1 tablet (20 mg total) by mouth daily. 04/17/14   Tresa Garter, MD  methimazole (TAPAZOLE) 10 MG tablet Take 2 tablets (20 mg total) by mouth daily. 04/17/14   Tresa Garter, MD  naproxen sodium (ANAPROX) 220 MG tablet Take 440 mg by mouth 2 (two) times daily with a meal.    Historical Provider, MD   BP 150/64  Pulse 96  Temp(Src) 99.2 F (37.3 C) (Oral)  Resp 16  SpO2 100%  LMP 05/30/2014 Physical Exam  Nursing note and vitals reviewed. Constitutional: She is oriented to person, place, and time. She appears well-developed and well-nourished.  Musculoskeletal: She exhibits tenderness.       Hands: Neurological: She is alert and oriented to  person, place, and time.  Skin: Skin is warm and dry.    ED Course  Procedures (including critical care time) Labs Review Labs Reviewed - No data to display  Imaging Review No results found.   MDM   1. Trigger ring finger of left hand        Billy Fischer, MD 06/04/14 2028

## 2014-06-13 NOTE — ED Provider Notes (Signed)
Medical screening examination/treatment/procedure(s) were performed by non-physician practitioner and as supervising physician I was immediately available for consultation/collaboration.   EKG Interpretation None       Rhonda HornJohn M Yuriko Portales, MD 06/13/14 [phone]

## 2014-07-02 ENCOUNTER — Ambulatory Visit (HOSPITAL_COMMUNITY): Payer: 59 | Attending: Family Medicine

## 2014-07-02 ENCOUNTER — Emergency Department (INDEPENDENT_AMBULATORY_CARE_PROVIDER_SITE_OTHER)
Admission: EM | Admit: 2014-07-02 | Discharge: 2014-07-02 | Disposition: A | Discharge status: Home / Self Care | Payer: 59 | Source: Home / Self Care | Attending: Family Medicine | Admitting: Family Medicine

## 2014-07-02 ENCOUNTER — Encounter (HOSPITAL_COMMUNITY): Payer: Self-pay | Admitting: Emergency Medicine

## 2014-07-02 DIAGNOSIS — R05 Cough: Secondary | ICD-10-CM | POA: Insufficient documentation

## 2014-07-02 DIAGNOSIS — J069 Acute upper respiratory infection, unspecified: Secondary | ICD-10-CM

## 2014-07-02 DIAGNOSIS — R509 Fever, unspecified: Secondary | ICD-10-CM | POA: Diagnosis not present

## 2014-07-02 DIAGNOSIS — J029 Acute pharyngitis, unspecified: Secondary | ICD-10-CM | POA: Diagnosis not present

## 2014-07-02 LAB — POCT RAPID STREP A: Streptococcus, Group A Screen (Direct): NEGATIVE

## 2014-07-02 MED ORDER — AZITHROMYCIN 250 MG PO TABS: 250.0000 mg | ORAL_TABLET | Freq: Every day | ORAL | Status: DC

## 2014-07-02 MED ORDER — IPRATROPIUM BROMIDE 0.06 % NA SOLN: 2.0000 | Freq: Four times a day (QID) | NASAL | Status: DC

## 2014-07-02 NOTE — Discharge Instructions (Signed)
Thank you for coming in today. °Call or go to the emergency room if you get worse, have trouble breathing, have chest pains, or palpitations.  ° ° °Upper Respiratory Infection, Adult °An upper respiratory infection (URI) is also sometimes known as the common cold. The upper respiratory tract includes the nose, sinuses, throat, trachea, and bronchi. Bronchi are the airways leading to the lungs. Most people improve within 1 week, but symptoms can last up to 2 weeks. A residual cough may last even longer.  °CAUSES °Many different viruses can infect the tissues lining the upper respiratory tract. The tissues become irritated and inflamed and often become very moist. Mucus production is also common. A cold is contagious. You can easily spread the virus to others by oral contact. This includes kissing, sharing a glass, coughing, or sneezing. Touching your mouth or nose and then touching a surface, which is then touched by another person, can also spread the virus. °SYMPTOMS  °Symptoms typically develop 1 to 3 days after you come in contact with a cold virus. Symptoms vary from person to person. They may include: °· Runny nose. °· Sneezing. °· Nasal congestion. °· Sinus irritation. °· Sore throat. °· Loss of voice (laryngitis). °· Cough. °· Fatigue. °· Muscle aches. °· Loss of appetite. °· Headache. °· Low-grade fever. °DIAGNOSIS  °You might diagnose your own cold based on familiar symptoms, since most people get a cold 2 to 3 times a year. Your caregiver can confirm this based on your exam. Most importantly, your caregiver can check that your symptoms are not due to another disease such as strep throat, sinusitis, pneumonia, asthma, or epiglottitis. Blood tests, throat tests, and X-rays are not necessary to diagnose a common cold, but they may sometimes be helpful in excluding other more serious diseases. Your caregiver will decide if any further tests are required. °RISKS AND COMPLICATIONS  °You may be at risk for a more  severe case of the common cold if you smoke cigarettes, have chronic heart disease (such as heart failure) or lung disease (such as asthma), or if you have a weakened immune system. The very young and very old are also at risk for more serious infections. Bacterial sinusitis, middle ear infections, and bacterial pneumonia can complicate the common cold. The common cold can worsen asthma and chronic obstructive pulmonary disease (COPD). Sometimes, these complications can require emergency medical care and may be life-threatening. °PREVENTION  °The best way to protect against getting a cold is to practice good hygiene. Avoid oral or hand contact with people with cold symptoms. Wash your hands often if contact occurs. There is no clear evidence that vitamin C, vitamin E, echinacea, or exercise reduces the chance of developing a cold. However, it is always recommended to get plenty of rest and practice good nutrition. °TREATMENT  °Treatment is directed at relieving symptoms. There is no cure. Antibiotics are not effective, because the infection is caused by a virus, not by bacteria. Treatment may include: °· Increased fluid intake. Sports drinks offer valuable electrolytes, sugars, and fluids. °· Breathing heated mist or steam (vaporizer or shower). °· Eating chicken soup or other clear broths, and maintaining good nutrition. °· Getting plenty of rest. °· Using gargles or lozenges for comfort. °· Controlling fevers with ibuprofen or acetaminophen as directed by your caregiver. °· Increasing usage of your inhaler if you have asthma. °Zinc gel and zinc lozenges, taken in the first 24 hours of the common cold, can shorten the duration and lessen the severity   of symptoms. Pain medicines may help with fever, muscle aches, and throat pain. A variety of non-prescription medicines are available to treat congestion and runny nose. Your caregiver can make recommendations and may suggest nasal or lung inhalers for other symptoms.    °HOME CARE INSTRUCTIONS  °· Only take over-the-counter or prescription medicines for pain, discomfort, or fever as directed by your caregiver. °· Use a warm mist humidifier or inhale steam from a shower to increase air moisture. This may keep secretions moist and make it easier to breathe. °· Drink enough water and fluids to keep your urine clear or pale yellow. °· Rest as needed. °· Return to work when your temperature has returned to normal or as your caregiver advises. You may need to stay home longer to avoid infecting others. You can also use a face mask and careful hand washing to prevent spread of the virus. °SEEK MEDICAL CARE IF:  °· After the first few days, you feel you are getting worse rather than better. °· You need your caregiver's advice about medicines to control symptoms. °· You develop chills, worsening shortness of breath, or brown or red sputum. These may be signs of pneumonia. °· You develop yellow or brown nasal discharge or pain in the face, especially when you bend forward. These may be signs of sinusitis. °· You develop a fever, swollen neck glands, pain with swallowing, or white areas in the back of your throat. These may be signs of strep throat. °SEEK IMMEDIATE MEDICAL CARE IF:  °· You have a fever. °· You develop severe or persistent headache, ear pain, sinus pain, or chest pain. °· You develop wheezing, a prolonged cough, cough up blood, or have a change in your usual mucus (if you have chronic lung disease). °· You develop sore muscles or a stiff neck. °Document Released: 01/25/2001 Document Revised: 10/24/2011 Document Reviewed: 11/06/2013 °ExitCare® Patient Information ©2015 Exit[employer], [employer]. This information is not intended to replace advice given to you by your health care provider. Make sure you discuss any questions you have with your health care provider. ° °

## 2014-07-02 NOTE — ED Notes (Signed)
C/o cold sx onset Monday Sx include: congestion, ST, bilateral ear itchiness, productive cough, fever Denies v/n/d Taking OTC cold meds w/no relief Alert, no signs of acute distress.

## 2014-07-02 NOTE — ED Provider Notes (Signed)
Rhonda Baldwin is a 33 y.o. female who presents to Urgent Care today for Sore throat ear itching throat itching cough sneezing body aches and fever. Cough is productive. No vomiting or diarrhea. Patient has tried DayQuil and NyQuil. She has a history of hypertension but just took her blood pressure medicine prior to presentation. No headache weakness or numbness.   Past Medical History  Diagnosis Date  . Hypertension   . Diabetes mellitus   . Thyroid disease    Past Surgical History  Procedure Laterality Date  . Cholecystectomy    . Cesarean section     History  Substance Use Topics  . Smoking status: Never Smoker   . Smokeless tobacco: Never Used  . Alcohol Use: No   ROS as above Medications: No current facility-administered medications for this encounter.   Current Outpatient Prescriptions  Medication Sig Dispense Refill  . atenolol (TENORMIN) 50 MG tablet Take 1.5 tablets (75 mg total) by mouth daily. 90 tablet 3  . insulin NPH-regular Human (NOVOLIN 70/30) (70-30) 100 UNIT/ML injection Inject 20 Units into the skin 2 (two) times daily with a meal. 3 vial 3  . lisinopril (PRINIVIL,ZESTRIL) 20 MG tablet Take 1 tablet (20 mg total) by mouth daily. 90 tablet 3  . methimazole (TAPAZOLE) 10 MG tablet Take 2 tablets (20 mg total) by mouth daily. 90 tablet 3  . albuterol (PROVENTIL HFA;VENTOLIN HFA) 108 (90 BASE) MCG/ACT inhaler Inhale 1-2 puffs into the lungs every 6 (six) hours as needed for wheezing or shortness of breath. 1 Inhaler 1  . azithromycin (ZITHROMAX) 250 MG tablet Take 1 tablet (250 mg total) by mouth daily. Take first 2 tablets together, then 1 every day until finished. 6 tablet 0  . glucose blood test strip Use as instructed 100 each 12  . glucose monitoring kit (FREESTYLE) monitoring kit 1 each by Does not apply route as needed for other. 1 each 2  . hydrochlorothiazide (HYDRODIURIL) 12.5 MG tablet Take 2 tablets (25 mg total) by mouth daily. 90 tablet 3  .  HYDROcodone-acetaminophen (NORCO/VICODIN) 5-325 MG per tablet Take 1-2 tablets by mouth every 4 (four) hours as needed. 10 tablet 0  . ipratropium (ATROVENT) 0.06 % nasal spray Place 2 sprays into both nostrils 4 (four) times daily. 15 mL 1  . naproxen sodium (ANAPROX) 220 MG tablet Take 440 mg by mouth 2 (two) times daily with a meal.     Allergies  Allergen Reactions  . Orange Swelling    oranges     Exam:  BP 192/90 mmHg  Pulse 101  Temp(Src) 98.8 F (37.1 C) (Oral)  Resp 12  SpO2 95%  LMP 06/28/2014 Gen: Well NAD HEENT: EOMI,  MMM posterior pharynx is mildly erythematous. Normal tympanic membranes bilaterally. Lungs: Normal work of breathing. Coarse breath sounds present bilaterally.  Heart: RRR no MRG Abd: NABS, Soft. Nondistended, Nontender Exts: Brisk capillary refill, warm and well perfused.   Results for orders placed or performed during the hospital encounter of 07/02/14 (from the past 24 hour(s))  POCT rapid strep A [facility])     Status: None   Collection Time: 07/02/14 10:47 AM  Result Value Ref Range   Streptococcus, Group A Screen (Direct) NEGATIVE NEGATIVE   Dg Chest 2 View  07/02/2014   CLINICAL DATA:  33 year old female with cough sore throat and fever for 3 days. Initial encounter.  EXAM: CHEST  2 VIEW  COMPARISON:  01/10/2014 and earlier.  FINDINGS: Large body habitus. Improved lung  volumes, normal. Cardiac size at the upper limits of normal. Other mediastinal contours are within normal limits. Visualized tracheal air column is within normal limits. No pneumothorax, pulmonary edema, pleural effusion or confluent pulmonary opacity. No acute osseous abnormality identified. Stable cholecystectomy clips.  IMPRESSION: Negative, no acute cardiopulmonary abnormality.   Electronically Signed   By: Lars Pinks M.D.   On: 07/02/2014 11:14    Assessment and Plan: 33 y.o. female with Viral URI possible bronchitis. Would like to use prednisone but unable to do to  elevated pressure and diabetes. Use Atrovent nasal spray and Tylenol. Azithromycin if not better.  Discussed warning signs or symptoms. Please see discharge instructions. Patient expresses understanding.     Gregor Hams, MD 07/02/14 [phone]

## 2014-07-04 LAB — CULTURE, GROUP A STREP

## 2014-11-06 ENCOUNTER — Telehealth: Payer: Self-pay | Admitting: Internal Medicine

## 2014-11-06 NOTE — Telephone Encounter (Signed)
Patient called to request a med refill for all of her current medications, patient states that she only has every other Friday off with the following Monday from work, patient needs enough medication to last her until her next appointment.

## 2014-11-15 ENCOUNTER — Encounter (HOSPITAL_BASED_OUTPATIENT_CLINIC_OR_DEPARTMENT_OTHER): Payer: Self-pay | Admitting: *Deleted

## 2014-11-15 ENCOUNTER — Emergency Department (HOSPITAL_BASED_OUTPATIENT_CLINIC_OR_DEPARTMENT_OTHER)
Admission: EM | Admit: 2014-11-15 | Discharge: 2014-11-15 | Disposition: A | Discharge status: Home / Self Care | Payer: 59 | Attending: Emergency Medicine | Admitting: Emergency Medicine

## 2014-11-15 DIAGNOSIS — J029 Acute pharyngitis, unspecified: Secondary | ICD-10-CM | POA: Insufficient documentation

## 2014-11-15 DIAGNOSIS — Z792 Long term (current) use of antibiotics: Secondary | ICD-10-CM | POA: Insufficient documentation

## 2014-11-15 DIAGNOSIS — Z791 Long term (current) use of non-steroidal anti-inflammatories (NSAID): Secondary | ICD-10-CM | POA: Diagnosis not present

## 2014-11-15 DIAGNOSIS — E119 Type 2 diabetes mellitus without complications: Secondary | ICD-10-CM | POA: Insufficient documentation

## 2014-11-15 DIAGNOSIS — I10 Essential (primary) hypertension: Secondary | ICD-10-CM | POA: Diagnosis not present

## 2014-11-15 DIAGNOSIS — Z794 Long term (current) use of insulin: Secondary | ICD-10-CM | POA: Diagnosis not present

## 2014-11-15 DIAGNOSIS — Z79899 Other long term (current) drug therapy: Secondary | ICD-10-CM | POA: Diagnosis not present

## 2014-11-15 LAB — RAPID STREP SCREEN (MED CTR MEBANE ONLY): Streptococcus, Group A Screen (Direct): NEGATIVE

## 2014-11-15 NOTE — ED Notes (Signed)
C/o sore throat and having chills since yesterday.

## 2014-11-15 NOTE — ED Provider Notes (Signed)
CSN: [id number]     Arrival date & time 11/15/14  1839 History  This chart was scribed for Veryl Speak, MD by Jeanell Sparrow, ED Scribe. This patient was seen in room MHT13/MHT13 and the patient's care was started at 7:39 PM.   Chief Complaint  Patient presents with  . Sore Throat   Patient is a 34 y.o. female presenting with pharyngitis. The history is provided by the patient. No language interpreter was used.  Sore Throat This is a new problem. The current episode started yesterday. The problem occurs rarely. The problem has been gradually worsening. Pertinent negatives include no abdominal pain. The symptoms are aggravated by swallowing. Nothing relieves the symptoms. She has tried nothing for the symptoms.   HPI Comments: Rhonda Baldwin is a 34 y.o. female who presents to the Emergency Department complaining of a constant moderate sore that started yesterday. She states that she started feeling sick yesterday with a sore throat, chills, and intermittent mild cough. She reports that today she was feeling worse today and so she came to the ED. She states that swallowing exacerbates the pain. She states that she has a hx of DM and is on insulin. She denies any sick contacts. She also denies any abdominal pain.   Past Medical History  Diagnosis Date  . Hypertension   . Diabetes mellitus   . Thyroid disease    Past Surgical History  Procedure Laterality Date  . Cholecystectomy    . Cesarean section     Family History  Problem Relation Age of Onset  . Hypertension Mother   . Hypertension Father    History  Substance Use Topics  . Smoking status: Never Smoker   . Smokeless tobacco: Never Used  . Alcohol Use: No   OB History    No data available     Review of Systems  Constitutional: Positive for chills.  HENT: Positive for sore throat.   Respiratory: Positive for cough.   Gastrointestinal: Negative for abdominal pain.    Allergies  Orange  Home Medications   Prior to  Admission medications   Medication Sig Start Date End Date Taking? Authorizing Provider  atenolol (TENORMIN) 50 MG tablet Take 1.5 tablets (75 mg total) by mouth daily. 04/17/14  Yes Tresa Garter, MD  glucose blood test strip Use as instructed 08/23/13  Yes Reyne Dumas, MD  glucose monitoring kit (FREESTYLE) monitoring kit 1 each by Does not apply route as needed for other. 08/23/13  Yes Reyne Dumas, MD  hydrochlorothiazide (HYDRODIURIL) 12.5 MG tablet Take 2 tablets (25 mg total) by mouth daily. 04/17/14  Yes Tresa Garter, MD  insulin NPH-regular Human (NOVOLIN 70/30) (70-30) 100 UNIT/ML injection Inject 20 Units into the skin 2 (two) times daily with a meal. 04/17/14  Yes Olugbemiga E Doreene Burke, MD  ipratropium (ATROVENT) 0.06 % nasal spray Place 2 sprays into both nostrils 4 (four) times daily. 07/02/14  Yes Gregor Hams, MD  lisinopril (PRINIVIL,ZESTRIL) 20 MG tablet Take 1 tablet (20 mg total) by mouth daily. 04/17/14  Yes Tresa Garter, MD  methimazole (TAPAZOLE) 10 MG tablet Take 2 tablets (20 mg total) by mouth daily. 04/17/14  Yes Tresa Garter, MD  naproxen sodium (ANAPROX) 220 MG tablet Take 440 mg by mouth 2 (two) times daily with a meal.   Yes Historical Provider, MD  albuterol (PROVENTIL HFA;VENTOLIN HFA) 108 (90 BASE) MCG/ACT inhaler Inhale 1-2 puffs into the lungs every 6 (six) hours as needed for wheezing  or shortness of breath. 01/10/14   Fredia Sorrow, MD  azithromycin (ZITHROMAX) 250 MG tablet Take 1 tablet (250 mg total) by mouth daily. Take first 2 tablets together, then 1 every day until finished. 07/02/14   Gregor Hams, MD  HYDROcodone-acetaminophen (NORCO/VICODIN) 5-325 MG per tablet Take 1-2 tablets by mouth every 4 (four) hours as needed. 06/01/14   Robyn M Hess, PA-C   BP 135/73 mmHg  Pulse 115  Temp(Src) 99.4 F (37.4 C) (Oral)  Resp 20  Ht '5\' 3"'  (1.6 m)  Wt 270 lb (122.471 kg)  BMI 47.84 kg/m2  SpO2 100%  LMP 11/14/2014 Physical Exam   Constitutional: She is oriented to person, place, and time. She appears well-developed and well-nourished. No distress.  HENT:  Head: Normocephalic and atraumatic.  Mouth/Throat: Oropharynx is clear and moist. No oropharyngeal exudate.  Mild erythema in the posterior oropharynx with no exudates.   Eyes: Conjunctivae and EOM are normal. Pupils are equal, round, and reactive to light.  Neck: Normal range of motion. Neck supple.  Cardiovascular: Normal rate and regular rhythm.   No murmur heard. Pulmonary/Chest: Effort normal and breath sounds normal. No respiratory distress.  Abdominal: Soft. There is no tenderness. There is no rebound and no guarding.  Musculoskeletal: Normal range of motion. She exhibits no edema or tenderness.  Neurological: She is alert and oriented to person, place, and time.  Skin: Skin is warm.  Psychiatric: She has a normal mood and affect. Her behavior is normal.  Nursing note and vitals reviewed.   ED Course  Procedures (including critical care time) DIAGNOSTIC STUDIES: Oxygen Saturation is 100% on RA, normal by my interpretation.    COORDINATION OF CARE: 7:43 PM- Pt advised of plan for treatment which includes labs and pt agrees.  Labs Review Labs Reviewed  RAPID STREP SCREEN  CULTURE, GROUP A STREP    Imaging Review No results found.   EKG Interpretation None      MDM   Final diagnoses:  None   Strep test negative.  OTC meds, return prn.  I personally performed the services described in this documentation, which was scribed in my presence. The recorded information has been reviewed and is accurate.      Veryl Speak, MD 11/16/14 [phone]

## 2014-11-15 NOTE — Discharge Instructions (Signed)
Ibuprofen 600 mg every 6 hours as needed for pain or fever.  Return to the emergency department for difficulty breathing or an inability to swallow.   Pharyngitis Pharyngitis is redness, pain, and swelling (inflammation) of your pharynx.  CAUSES  Pharyngitis is usually caused by infection. Most of the time, these infections are from viruses (viral) and are part of a cold. However, sometimes pharyngitis is caused by bacteria (bacterial). Pharyngitis can also be caused by allergies. Viral pharyngitis may be spread from person to person by coughing, sneezing, and personal items or utensils (cups, forks, spoons, toothbrushes). Bacterial pharyngitis may be spread from person to person by more intimate contact, such as kissing.  SIGNS AND SYMPTOMS  Symptoms of pharyngitis include:   Sore throat.   Tiredness (fatigue).   Low-grade fever.   Headache.  Joint pain and muscle aches.  Skin rashes.  Swollen lymph nodes.  Plaque-like film on throat or tonsils (often seen with bacterial pharyngitis). DIAGNOSIS  Your health care provider will ask you questions about your illness and your symptoms. Your medical history, along with a physical exam, is often all that is needed to diagnose pharyngitis. Sometimes, a rapid strep test is done. Other lab tests may also be done, depending on the suspected cause.  TREATMENT  Viral pharyngitis will usually get better in 3-4 days without the use of medicine. Bacterial pharyngitis is treated with medicines that kill germs (antibiotics).  HOME CARE INSTRUCTIONS   Drink enough water and fluids to keep your urine clear or pale yellow.   Only take over-the-counter or prescription medicines as directed by your health care provider:   If you are prescribed antibiotics, make sure you finish them even if you start to feel better.   Do not take aspirin.   Get lots of rest.   Gargle with 8 oz of salt water ( tsp of salt per 1 qt of water) as often as  every 1-2 hours to soothe your throat.   Throat lozenges (if you are not at risk for choking) or sprays may be used to soothe your throat. SEEK MEDICAL CARE IF:   You have large, tender lumps in your neck.  You have a rash.  You cough up green, yellow-brown, or bloody spit. SEEK IMMEDIATE MEDICAL CARE IF:   Your neck becomes stiff.  You drool or are unable to swallow liquids.  You vomit or are unable to keep medicines or liquids down.  You have severe pain that does not go away with the use of recommended medicines.  You have trouble breathing (not caused by a stuffy nose). MAKE SURE YOU:   Understand these instructions.  Will watch your condition.  Will get help right away if you are not doing well or get worse. Document Released: 08/01/2005 Document Revised: 05/22/2013 Document Reviewed: 04/08/2013 [facility] Patient Information 2015 [employer] [place]. This information is not intended to replace advice given to you by your health care provider. Make sure you discuss any questions you have with your health care provider.

## 2014-11-17 ENCOUNTER — Other Ambulatory Visit: Payer: Self-pay

## 2014-11-18 LAB — CULTURE, GROUP A STREP

## 2014-12-09 ENCOUNTER — Other Ambulatory Visit: Payer: Self-pay

## 2014-12-09 VITALS — BP 142/82 | HR 94 | Ht 63.0 in | Wt 284.6 lb

## 2014-12-09 DIAGNOSIS — E119 Type 2 diabetes mellitus without complications: Secondary | ICD-10-CM

## 2014-12-09 LAB — POCT GLYCOSYLATED HEMOGLOBIN (HGB A1C): Hemoglobin A1C: 10.1

## 2014-12-09 NOTE — Patient Outreach (Signed)
[facility]) Care Management   12/09/2014  Rhonda Baldwin 09/24/1980 [id number]  Rhonda Baldwin is an 34 y.o. female.  Member seen for initial office visit for Link to Wellness program for self management of Type 2 diabetes  Subjective: member states that she has not been watching her diet very well and she like to eat junk food at night.  States that since she has had her thyroid problem her weight has gone up.  States she has not seen her doctor since the fall and she knows she has not been controlling her diabetes very well.  States she has not been checking her blood sugars for a long time.  States that she ran out of strips but she has not refilled her rx.  States she does take her insulin twice a day as ordered.  Objective:   Review of Systems  All other systems reviewed and are negative.   Physical Exam  Filed Vitals:   12/09/14 1520  BP: 142/82  Pulse: 94   Filed Weights   12/09/14 1520  Weight: 284 lb 9.6 oz (129.094 kg)  POCT Hemoglobin A1C: 10.1  Current Medications:   Current Outpatient Prescriptions  Medication Sig Dispense Refill  . albuterol (PROVENTIL HFA;VENTOLIN HFA) 108 (90 BASE) MCG/ACT inhaler Inhale 1-2 puffs into the lungs every 6 (six) hours as needed for wheezing or shortness of breath. 1 Inhaler 1  . atenolol (TENORMIN) 50 MG tablet Take 1.5 tablets (75 mg total) by mouth daily. 90 tablet 3  . glucose blood test strip Use as instructed 100 each 12  . glucose monitoring kit (FREESTYLE) monitoring kit 1 each by Does not apply route as needed for other. 1 each 2  . hydrochlorothiazide (HYDRODIURIL) 12.5 MG tablet Take 2 tablets (25 mg total) by mouth daily. 90 tablet 3  . insulin NPH-regular Human (NOVOLIN 70/30) (70-30) 100 UNIT/ML injection Inject 20 Units into the skin 2 (two) times daily with a meal. 3 vial 3  . ipratropium (ATROVENT) 0.06 % nasal spray Place 2 sprays into both nostrils 4 (four) times daily. 15 mL 1  . lisinopril  (PRINIVIL,ZESTRIL) 20 MG tablet Take 1 tablet (20 mg total) by mouth daily. 90 tablet 3  . loratadine (CLARITIN) 10 MG tablet Take 10 mg by mouth daily as needed for allergies.    . methimazole (TAPAZOLE) 10 MG tablet Take 2 tablets (20 mg total) by mouth daily. 90 tablet 3  . azithromycin (ZITHROMAX) 250 MG tablet Take 1 tablet (250 mg total) by mouth daily. Take first 2 tablets together, then 1 every day until finished. (Patient not taking: Reported on 12/09/2014) 6 tablet 0  . HYDROcodone-acetaminophen (NORCO/VICODIN) 5-325 MG per tablet Take 1-2 tablets by mouth every 4 (four) hours as needed. (Patient not taking: Reported on 12/09/2014) 10 tablet 0  . naproxen sodium (ANAPROX) 220 MG tablet Take 440 mg by mouth 2 (two) times daily with a meal. As needed     No current facility-administered medications for this visit.    Functional Status:   In your present state of health, do you have any difficulty performing the following activities: 12/09/2014  Hearing? N  Vision? N  Difficulty concentrating or making decisions? N  Walking or climbing stairs? N  Dressing or bathing? N  Doing errands, shopping? N    Fall/Depression Screening:    PHQ 2/9 Scores 12/09/2014 04/17/2014  PHQ - 2 Score 0 0   [facility] CM Care Plan  Patient Outreach from 12/09/2014 in [place] Problem One  Elevated blood sugars related to dx of Type 2 DM as evidenced by hemoglobin A1C of 10.1   Care Plan for Problem One  Active   Interventions for Problem One Long Term Goal  Instructed on CHO counting and portion control, instructed on making wise choices for snacks and to avoid junk foods and sweets,  Instructed on importance of checking her blood sugars daily and instructed she can get her strips refilled when she refills her insulin, instructed  on use of her insulin and that she might need  to have her dosage adjusted  since her hemoglobin A1C is elevated,  Instructed to call MD to schedule appt  as soon as possible, instructed  on importance of exercise for  glycemic control   [facility] Long Term Goal (31-90 days)  Member will see decrease of hemoglobin A1C  to 8.5 within the next 90 days   [facility] Long Term Goal Start Date  12/09/14   [facility] CM Short Term Goal #1 (0-30 days)  Member will complete EMMI programs by 01/10/15   [facility] CM Short Term Goal #1 Start Date  12/09/14       Assessment:  Member seen for initial Link to Wellness visit for self management of Type 2 DM.  Member has not been seen by her MD since September.  Reports that she has not been watching her diet closely and she has not been checking her blood sugars for months, She reports she does take her insulin twice a day as ordered.  She has not been exercising.    Plan:  Plan to attend [facility].  They will call you to schedule or call [phone] Plan to eat 30-45 GM (2-3) servings of carbohydrate a meal and 15 GM for snacks. Call pharmacy to refill testing strips Plan to check blood sugar once daily either fasting or 1 -2hrs after a meal.  Goals of 80-130 fasting and 180 or less 1 -2 hours after eating Plan to walk twice a week for 30 minutes.  Goal of 150 minutes a week Plan to complete EMMI programs by 01/10/15 Plan to call MD to make a follow up appointment Plan to return to Link to Wellness on February 10, 2015 at [place], [facility] Care Management Coordinator-Link to [place] Management [phone]

## 2014-12-10 NOTE — Patient Instructions (Addendum)
Given Link to Wellness diabetes teaching packet  1. Plan to attend required class at [facility].  They will call you or call [phone] 2. Plan to eat 30-45 GM (2-3) servings of carbohydrate a meal and 15 GM for snacks. 3. Call pharmacy to refill testing strips 4. Plan to check blood sugar once daily either fasting or 1 -2hrs after a meal.  Goals of 80-130 fasting and 180 or less 1 -2 hours after eating 5. Plan to walk twice a week for 30 minutes.  Goal of 150 minutes a week 6. Plan to call MD to make a follow up appointment 7. Plan to return to [facility] on February 10, 2015 at [id number]

## 2014-12-11 ENCOUNTER — Other Ambulatory Visit: Payer: Self-pay | Admitting: Internal Medicine

## 2014-12-11 DIAGNOSIS — I10 Essential (primary) hypertension: Secondary | ICD-10-CM

## 2014-12-11 DIAGNOSIS — E1165 Type 2 diabetes mellitus with hyperglycemia: Secondary | ICD-10-CM

## 2014-12-11 DIAGNOSIS — E0590 Thyrotoxicosis, unspecified without thyrotoxic crisis or storm: Secondary | ICD-10-CM

## 2014-12-11 MED ORDER — HYDROCHLOROTHIAZIDE 12.5 MG PO TABS: 25.0000 mg | ORAL_TABLET | Freq: Every day | ORAL | Status: DC

## 2014-12-11 MED ORDER — LISINOPRIL 20 MG PO TABS: 20.0000 mg | ORAL_TABLET | Freq: Every day | ORAL | Status: DC

## 2014-12-11 MED ORDER — INSULIN NPH ISOPHANE & REGULAR (70-30) 100 UNIT/ML ~~LOC~~ SUSP: 20.0000 [IU] | Freq: Two times a day (BID) | SUBCUTANEOUS | Status: DC

## 2014-12-11 MED ORDER — ATENOLOL 50 MG PO TABS: 75.0000 mg | ORAL_TABLET | Freq: Every day | ORAL | Status: DC

## 2014-12-11 NOTE — Telephone Encounter (Signed)
Refills sent

## 2014-12-11 NOTE — Telephone Encounter (Signed)
Pt requesting refill on HTN and DM meds, [facility] send [facility].

## 2014-12-15 ENCOUNTER — Ambulatory Visit: Payer: 59 | Attending: Internal Medicine | Admitting: Internal Medicine

## 2014-12-15 ENCOUNTER — Encounter: Payer: Self-pay | Admitting: Internal Medicine

## 2014-12-15 VITALS — BP 128/82 | HR 89 | Temp 98.6°F | Ht 63.0 in | Wt 279.0 lb

## 2014-12-15 DIAGNOSIS — E669 Obesity, unspecified: Secondary | ICD-10-CM | POA: Diagnosis not present

## 2014-12-15 DIAGNOSIS — E1165 Type 2 diabetes mellitus with hyperglycemia: Secondary | ICD-10-CM | POA: Insufficient documentation

## 2014-12-15 DIAGNOSIS — E0590 Thyrotoxicosis, unspecified without thyrotoxic crisis or storm: Secondary | ICD-10-CM | POA: Insufficient documentation

## 2014-12-15 DIAGNOSIS — Z6841 Body Mass Index (BMI) 40.0 and over, adult: Secondary | ICD-10-CM | POA: Insufficient documentation

## 2014-12-15 DIAGNOSIS — N924 Excessive bleeding in the premenopausal period: Secondary | ICD-10-CM | POA: Diagnosis not present

## 2014-12-15 DIAGNOSIS — I10 Essential (primary) hypertension: Secondary | ICD-10-CM | POA: Diagnosis not present

## 2014-12-15 DIAGNOSIS — N920 Excessive and frequent menstruation with regular cycle: Secondary | ICD-10-CM | POA: Diagnosis not present

## 2014-12-15 LAB — CBC WITH DIFFERENTIAL/PLATELET
Basophils Absolute: 0 10*3/uL (ref 0.0–0.1)
Basophils Relative: 0 % (ref 0–1)
Eosinophils Absolute: 0.1 10*3/uL (ref 0.0–0.7)
Eosinophils Relative: 1 % (ref 0–5)
HCT: 41.2 % (ref 36.0–46.0)
Hemoglobin: 13.2 g/dL (ref 12.0–15.0)
Lymphocytes Relative: 35 % (ref 12–46)
Lymphs Abs: 2.7 10*3/uL (ref 0.7–4.0)
MCH: 26.1 pg (ref 26.0–34.0)
MCHC: 32 g/dL (ref 30.0–36.0)
MCV: 81.6 fL (ref 78.0–100.0)
MPV: 11 fL (ref 8.6–12.4)
Monocytes Absolute: 0.6 10*3/uL (ref 0.1–1.0)
Monocytes Relative: 8 % (ref 3–12)
Neutro Abs: 4.3 10*3/uL (ref 1.7–7.7)
Neutrophils Relative %: 56 % (ref 43–77)
Platelets: 243 10*3/uL (ref 150–400)
RBC: 5.05 MIL/uL (ref 3.87–5.11)
RDW: 13.5 % (ref 11.5–15.5)
WBC: 7.6 10*3/uL (ref 4.0–10.5)

## 2014-12-15 LAB — LIPID PANEL
Cholesterol: 152 mg/dL (ref 0–200)
HDL: 55 mg/dL (ref >=46)
LDL Cholesterol: 82 mg/dL (ref 0–99)
Total CHOL/HDL Ratio: 2.8 Ratio
Triglycerides: 74 mg/dL (ref <150)
VLDL: 15 mg/dL (ref 0–40)

## 2014-12-15 LAB — POCT URINALYSIS DIPSTICK
Bilirubin, UA: NEGATIVE
Glucose, UA: 500
Ketones, UA: NEGATIVE
Leukocytes, UA: NEGATIVE
Nitrite, UA: NEGATIVE
Protein, UA: NEGATIVE
Spec Grav, UA: 1.01
Urobilinogen, UA: 0.2
pH, UA: 6

## 2014-12-15 LAB — COMPLETE METABOLIC PANEL WITH GFR
ALT: 21 U/L (ref 0–35)
AST: 15 U/L (ref 0–37)
Albumin: 3.8 g/dL (ref 3.5–5.2)
Alkaline Phosphatase: 148 U/L — ABNORMAL HIGH (ref 39–117)
BUN: 22 mg/dL (ref 6–23)
CO2: 26 mEq/L (ref 19–32)
Calcium: 9.6 mg/dL (ref 8.4–10.5)
Chloride: 98 mEq/L (ref 96–112)
Creat: 0.59 mg/dL (ref 0.50–1.10)
GFR, Est African American: >89 mL/min
GFR, Est Non African American: >89 mL/min
Glucose, Bld: 265 mg/dL — ABNORMAL HIGH (ref 70–99)
Potassium: 4.8 mEq/L (ref 3.5–5.3)
Sodium: 136 mEq/L (ref 135–145)
Total Bilirubin: 0.4 mg/dL (ref 0.2–1.2)
Total Protein: 7.9 g/dL (ref 6.0–8.3)

## 2014-12-15 LAB — TSH: TSH: <0.008 u[IU]/mL — ABNORMAL LOW (ref 0.350–4.500)

## 2014-12-15 LAB — T4, FREE: Free T4: 2.62 ng/dL — ABNORMAL HIGH (ref 0.80–1.80)

## 2014-12-15 LAB — GLUCOSE, POCT (MANUAL RESULT ENTRY): POC Glucose: 385 mg/dl — AB (ref 70–99)

## 2014-12-15 MED ORDER — LORATADINE 10 MG PO TABS: 10.0000 mg | ORAL_TABLET | Freq: Every day | ORAL | Status: AC | PRN

## 2014-12-15 MED ORDER — METHIMAZOLE 10 MG PO TABS: 20.0000 mg | ORAL_TABLET | Freq: Every day | ORAL | Status: DC

## 2014-12-15 MED ORDER — ALBUTEROL SULFATE HFA 108 (90 BASE) MCG/ACT IN AERS: 1.0000 | INHALATION_SPRAY | Freq: Four times a day (QID) | RESPIRATORY_TRACT | Status: DC | PRN

## 2014-12-15 MED ORDER — LISINOPRIL-HYDROCHLOROTHIAZIDE 20-25 MG PO TABS: 1.0000 | ORAL_TABLET | Freq: Every day | ORAL | Status: DC

## 2014-12-15 MED ORDER — METFORMIN HCL 1000 MG PO TABS
1000.0000 mg | ORAL_TABLET | Freq: Two times a day (BID) | ORAL | Status: DC
Start: 2014-12-15 — End: 2017-07-02

## 2014-12-15 MED ORDER — ATENOLOL 50 MG PO TABS: 75.0000 mg | ORAL_TABLET | Freq: Every day | ORAL | Status: DC

## 2014-12-15 MED ORDER — INSULIN NPH ISOPHANE & REGULAR (70-30) 100 UNIT/ML ~~LOC~~ SUSP: 20.0000 [IU] | Freq: Two times a day (BID) | SUBCUTANEOUS | Status: DC

## 2014-12-15 MED ORDER — INSULIN ASPART 100 UNIT/ML ~~LOC~~ SOLN
20.0000 [IU] | Freq: Once | SUBCUTANEOUS | Status: AC
  Administered 2014-12-15: 20 [IU] via SUBCUTANEOUS

## 2014-12-15 MED ORDER — IPRATROPIUM BROMIDE 0.06 % NA SOLN: 2.0000 | Freq: Four times a day (QID) | NASAL | Status: DC

## 2014-12-15 NOTE — Progress Notes (Signed)
Patient here to follow up on her diabetes (A1c 11/2014 was 10.1);hyperthyroid, and HTN.   She needs refills on all her medications and has had a normal pap in 11/2013

## 2014-12-15 NOTE — Progress Notes (Signed)
Subjective:     Patient ID: Rhonda CooperSharona Y Baldwin, female   DOB: 07/29/1981, 34 y.o.   MRN: [id number]  HPI Rhonda Baldwin is a 34 yo [place] female with history of T2DM, essential HTN, and primary hyperthyroidism who presents today for a follow up visit. Patient notes she has been taking all of her medications everyday and would like refills for all her medications today.    In the past few months, she has noted excessive sweating, very heavy periods with lot of clotting, episodes of palpitations, weight gain, and throbbing headaches that are localized behind both her eyes. She alternates throughout the day between feeling very cold and very hot. She denies chest pain SOB, DOE, visual changes. Denies nausea, vomiting, diarrhea, constipation.   She is in the process of separating from her husband and notes that this has contributed to increased stress in the past few months, and she has not been eating a healthy diet or exercising. She eats a lot of sweet and salty foods, although she realizes this is not healthy for her. She had not been checking her blood sugar at home until recently. In the past week, she notes blood sugar levels at home range from 153 to 224. Denies any numbness, tingling, and swelling in extremities.  Active Ambulatory Problems    Diagnosis Date Noted  . Pneumonia 08/17/2012  . Nausea vomiting and diarrhea 08/17/2012  . Gastritis 08/17/2012  . HTN (hypertension) 08/17/2012  . DM (diabetes mellitus) 08/17/2012  . Primary hyperthyroidism 08/19/2012  . Obesity, unspecified 05/03/2013  . Diabetes 11/28/2013  . Need for prophylactic vaccination and inoculation against influenza 04/17/2014  . Essential hypertension 04/17/2014  . Menorrhagia, premenopausal 12/15/2014   Resolved Ambulatory Problems    Diagnosis Date Noted  . No Resolved Ambulatory Problems   Past Medical History  Diagnosis Date  . Hypertension   . Diabetes mellitus   . Thyroid disease     Past Medical  History  Diagnosis Date  . Hypertension   . Diabetes mellitus   . Thyroid disease       Review of Systems  Constitutional: Positive for fatigue.  Eyes: Negative for visual disturbance.  Respiratory: Negative for cough, chest tightness and shortness of breath.   Cardiovascular: Positive for palpitations. Negative for chest pain and leg swelling.  Gastrointestinal: Negative for nausea, vomiting, diarrhea and constipation.  Endocrine: Positive for cold intolerance and heat intolerance.  Genitourinary: Negative for urgency, frequency and hematuria.  Neurological: Negative for numbness.       Objective: Filed Vitals:   12/15/14 1527  BP: 128/82  Pulse: 89  Temp:       Physical Exam  Constitutional:  Well-appearing, pleasant obese 34 yo AA female alert, oriented, and in no acute distress.   HENT:  Head: Normocephalic and atraumatic.  Eyes: Conjunctivae and EOM are normal. Pupils are equal, round, and reactive to light. No scleral icterus.  Neck: No JVD present. No thyromegaly present.  Cardiovascular: Normal rate, regular rhythm, normal heart sounds and intact distal pulses.  Exam reveals no gallop and no friction rub.   No murmur heard. Pulmonary/Chest: Effort normal and breath sounds normal.  Musculoskeletal: Normal range of motion. She exhibits no edema.  Lymphadenopathy:    She has no cervical adenopathy.  Skin:  Generalized moist and sweaty skin.        Assessment & Plan:  Rhonda Baldwin is a 34 yo [place] female with history of T2DM, essential HTN, and primary hyperthyroidism who  presents today for a follow up visit.   Type 2 Diabetes mellitus with hyperglycemia Patient's T2DM is poorly controlled and her most recent A1C on 12/09/14 was 10.1 and CBG today is 385. Treated pt in clinictoday with insulin aspart injection 20 units into skin once. Counseled patient extensively on importance of low sugar diet, daily exercise, and monitoring blood sugar levels at home.  Will continue pt on same insulin dose and add metformin. Ordered the following:  -     Glucose (CBG) -     Microalbumin, urine -     insulin aspart (novoLOG) injection 20 Units; Inject 0.2 mLs (20 Units total) into the skin once. -     Urinalysis Dipstick -     insulin NPH-regular Human (NOVOLIN 70/30) (70-30) 100 UNIT/ML injection; Inject 20 Units             into the skin 2 (two) times daily with a meal -     metFORMIN (GLUCOPHAGE) 1000 MG tablet; Take 1 tablet (1,000 mg total) by mouth 2 (two)       times daily with a meal.  Primary hyperthyroidism Patient symptoms such as increased sweatiness, palpitations, and menorrhagia despite reported medication adherence are suggestive of poorly controlled hyperthyroidism. Will check TSH and free T4 level to assess thyroid function. Will refill prescriptions for atenolol and methimazole. Ordered the following:  -     atenolol (TENORMIN) 50 MG tablet; Take 1.5 tablets (75 mg total) by mouth daily. -     methimazole (TAPAZOLE) 10 MG tablet; Take 2 tablets (20 mg total) by mouth daily. -     TSH -     T4, Free  Essential hypertension BP initially checked in clinic was 163/80 but dropped to 128/82 upon rechecking at end of clinic visit. Ordered following medication refill and labs:  -     lisinopril-hydrochlorothiazide (PRINZIDE,ZESTORETIC) 20-25 MG per tablet; Take 1 tablet by        mouth daily. -     atenolol (TENORMIN) 50 MG tablet; Take 1.5 tablets (75 mg total) by mouth daily. -     CBC with Differential/Platelet -     COMPLETE METABOLIC PANEL WITH GFR -     Lipid panel  Menorrhagia, premenopausal Patient's menorrhagia may be due to poorly controlled hyperthyroidism. Clotting with menorrhagia is suggestive of uterine fibroids as well. Will check TSH, free T4 levels and order following imaging to check for uterine pathology:  -     US Pelvis Complete; Future -     US Transvaginal Non-OB; Future  Other orders -   albuterol (PROVENTIL  HFA;VENTOLIN HFA) 108 (90 BASE) MCG/ACT inhaler; Inhale 1-2               puffs into the lungs every 6 (six) hours as needed for wheezing or shortness of breath. -   ipratropium (ATROVENT) 0.06 % nasal spray; Place 2 sprays into both nostrils 4 (four) times          daily. -   loratadine (CLARITIN) 10 MG tablet; Take 1 tablet (10 mg total) by mouth daily as needed for           allergies.   Evaluation and management procedures were performed by me with Medical Student in attendance, note written by medical student under my supervision and collaboration. I have reviewed the note and I agree with the management and plan.   Jeanann Lewandowsky, MD, MHA, CPE, FACP, FAAP [facility] and [facility]  [place], [place] [phone]

## 2014-12-15 NOTE — Patient Instructions (Signed)
Diabetes and Exercise Exercising regularly is important. It is not just about losing weight. It has many health benefits, such as:  Improving your overall fitness, flexibility, and endurance.  Increasing your bone density.  Helping with weight control.  Decreasing your body fat.  Increasing your muscle strength.  Reducing stress and tension.  Improving your overall health. People with diabetes who exercise gain additional benefits because exercise:  Reduces appetite.  Improves the body's use of blood sugar (glucose).  Helps lower or control blood glucose.  Decreases blood pressure.  Helps control blood lipids (such as cholesterol and triglycerides).  Improves the body's use of the hormone insulin by:  Increasing the body's insulin sensitivity.  Reducing the body's insulin needs.  Decreases the risk for heart disease because exercising:  Lowers cholesterol and triglycerides levels.  Increases the levels of good cholesterol (such as high-density lipoproteins [HDL]) in the body.  Lowers blood glucose levels. YOUR ACTIVITY PLAN  Choose an activity that you enjoy and set realistic goals. Your health care provider or diabetes educator can help you make an activity plan that works for you. Exercise regularly as directed by your health care provider. This includes:  Performing resistance training twice a week such as push-ups, sit-ups, lifting weights, or using resistance bands.  Performing 150 minutes of cardio exercises each week such as walking, running, or playing sports.  Staying active and spending no more than 90 minutes at one time being inactive. Even short bursts of exercise are good for you. Three 10-minute sessions spread throughout the day are just as beneficial as a single 30-minute session. Some exercise ideas include:  Taking the dog for a walk.  Taking the stairs instead of the elevator.  Dancing to your favorite song.  Doing an exercise  video.  Doing your favorite exercise with a friend. RECOMMENDATIONS FOR EXERCISING WITH TYPE 1 OR TYPE 2 DIABETES   Check your blood glucose before exercising. If blood glucose levels are greater than 240 mg/dL, check for urine ketones. Do not exercise if ketones are present.  Avoid injecting insulin into areas of the body that are going to be exercised. For example, avoid injecting insulin into:  The arms when playing tennis.  The legs when jogging.  Keep a record of:  Food intake before and after you exercise.  Expected peak times of insulin action.  Blood glucose levels before and after you exercise.  The type and amount of exercise you have done.  Review your records with your health care provider. Your health care provider will help you to develop guidelines for adjusting food intake and insulin amounts before and after exercising.  If you take insulin or oral hypoglycemic agents, watch for signs and symptoms of hypoglycemia. They include:  Dizziness.  Shaking.  Sweating.  Chills.  Confusion.  Drink plenty of water while you exercise to prevent dehydration or heat stroke. Body water is lost during exercise and must be replaced.  Talk to your health care provider before starting an exercise program to make sure it is safe for you. Remember, almost any type of activity is better than none. Document Released: 10/22/2003 Document Revised: 12/16/2013 Document Reviewed: 01/08/2013 ExitCare Patient Information 2015 [employer]. This information is not intended to replace advice given to you by your health care provider. Make sure you discuss any questions you have with your health care provider. Basic Carbohydrate Counting for Diabetes Mellitus Carbohydrate counting is a method for keeping track of the amount of carbohydrates you eat.   Eating carbohydrates naturally increases the level of sugar (glucose) in your blood, so it is important for you to know the amount that is  okay for you to have in every meal. Carbohydrate counting helps keep the level of glucose in your blood within normal limits. The amount of carbohydrates allowed is different for every person. A dietitian can help you calculate the amount that is right for you. Once you know the amount of carbohydrates you can have, you can count the carbohydrates in the foods you want to eat. Carbohydrates are found in the following foods:  Grains, such as breads and cereals.  Dried beans and soy products.  Starchy vegetables, such as potatoes, peas, and corn.  Fruit and fruit juices.  Milk and yogurt.  Sweets and snack foods, such as cake, cookies, candy, chips, soft drinks, and fruit drinks. CARBOHYDRATE COUNTING There are two ways to count the carbohydrates in your food. You can use either of the methods or a combination of both. Reading the "Nutrition Facts" on Packaged Food The "Nutrition Facts" is an area that is included on the labels of almost all packaged food and beverages in the [place]. It includes the serving size of that food or beverage and information about the nutrients in each serving of the food, including the grams (g) of carbohydrate per serving.  Decide the number of servings of this food or beverage that you will be able to eat or drink. Multiply that number of servings by the number of grams of carbohydrate that is listed on the label for that serving. The total will be the amount of carbohydrates you will be having when you eat or drink this food or beverage. Learning Standard Serving Sizes of Food When you eat food that is not packaged or does not include "Nutrition Facts" on the label, you need to measure the servings in order to count the amount of carbohydrates.A serving of most carbohydrate-rich foods contains about 15 g of carbohydrates. The following list includes serving sizes of carbohydrate-rich foods that provide 15 g ofcarbohydrate per serving:   1 slice of bread  (1 oz) or 1 six-inch tortilla.    of a hamburger bun or English muffin.  4-6 crackers.   cup unsweetened dry cereal.    cup hot cereal.   cup rice or pasta.    cup mashed potatoes or  of a large baked potato.  1 cup fresh fruit or one small piece of fruit.    cup canned or frozen fruit or fruit juice.  1 cup milk.   cup plain fat-free yogurt or yogurt sweetened with artificial sweeteners.   cup cooked dried beans or starchy vegetable, such as peas, corn, or potatoes.  Decide the number of standard-size servings that you will eat. Multiply that number of servings by 15 (the grams of carbohydrates in that serving). For example, if you eat 2 cups of strawberries, you will have eaten 2 servings and 30 g of carbohydrates (2 servings x 15 g = 30 g). For foods such as soups and casseroles, in which more than one food is mixed in, you will need to count the carbohydrates in each food that is included. EXAMPLE OF CARBOHYDRATE COUNTING Sample Dinner  3 oz chicken breast.   cup of brown rice.   cup of corn.  1 cup milk.   1 cup strawberries with sugar-free whipped topping.  Carbohydrate Calculation Step 1: Identify the foods that contain carbohydrates:   Rice.   Corn.     Milk.   Strawberries. Step 2:Calculate the number of servings eaten of each:   2 servings of rice.   1 serving of corn.   1 serving of milk.   1 serving of strawberries. Step 3: Multiply each of those number of servings by 15 g:   2 servings of rice x 15 g = 30 g.   1 serving of corn x 15 g = 15 g.   1 serving of milk x 15 g = 15 g.   1 serving of strawberries x 15 g = 15 g. Step 4: Add together all of the amounts to find the total grams of carbohydrates eaten: 30 g + 15 g + 15 g + 15 g = 75 g. Document Released: 08/01/2005 Document Revised: 12/16/2013 Document Reviewed: 06/28/2013 ExitCare Patient Information 2015 [employer]. This information is not intended to  replace advice given to you by your health care provider. Make sure you discuss any questions you have with your health care provider.  

## 2014-12-16 LAB — MICROALBUMIN, URINE: Microalb, Ur: 0.2 mg/dL (ref <2.0)

## 2014-12-18 ENCOUNTER — Ambulatory Visit (HOSPITAL_COMMUNITY)
Admission: RE | Admit: 2014-12-18 | Discharge: 2014-12-18 | Disposition: A | Discharge status: Home / Self Care | Payer: 59 | Source: Ambulatory Visit | Attending: Internal Medicine | Admitting: Internal Medicine

## 2014-12-18 DIAGNOSIS — N920 Excessive and frequent menstruation with regular cycle: Secondary | ICD-10-CM | POA: Insufficient documentation

## 2014-12-18 DIAGNOSIS — E119 Type 2 diabetes mellitus without complications: Secondary | ICD-10-CM | POA: Insufficient documentation

## 2014-12-18 DIAGNOSIS — N924 Excessive bleeding in the premenopausal period: Secondary | ICD-10-CM

## 2014-12-20 ENCOUNTER — Encounter: Payer: 59 | Attending: Internal Medicine

## 2014-12-20 VITALS — Ht 63.0 in | Wt 275.9 lb

## 2014-12-20 DIAGNOSIS — Z794 Long term (current) use of insulin: Secondary | ICD-10-CM | POA: Insufficient documentation

## 2014-12-20 DIAGNOSIS — Z713 Dietary counseling and surveillance: Secondary | ICD-10-CM | POA: Insufficient documentation

## 2014-12-20 DIAGNOSIS — E119 Type 2 diabetes mellitus without complications: Secondary | ICD-10-CM | POA: Diagnosis present

## 2014-12-22 ENCOUNTER — Telehealth: Payer: Self-pay | Admitting: Emergency Medicine

## 2014-12-22 DIAGNOSIS — E0590 Thyrotoxicosis, unspecified without thyrotoxic crisis or storm: Secondary | ICD-10-CM

## 2014-12-22 MED ORDER — METHIMAZOLE 10 MG PO TABS: 20.0000 mg | ORAL_TABLET | Freq: Two times a day (BID) | ORAL | Status: DC

## 2014-12-22 NOTE — Telephone Encounter (Signed)
-----   Message from Quentin Angstlugbemiga E Jegede, MD sent at 12/22/2014  4:46 PM EDT ----- Please inform patient her laboratory test results are mostly within normal limits. Her hemoglobin is back to normal. However her thyroid function shows hyperfunctioning which may also be causing her heavy bleeding and other symptoms. Informed patient to start taking methimazole 20 mg tablet by mouth twice a day instead of 20 mg tablet by mouth daily. Currently patient is taking 2 tablets of 10 mg methimazole daily, take 2 tablets of 10 mg methimazole twice daily henceforth. We will repeat thyroid function test in 3 months

## 2014-12-22 NOTE — Telephone Encounter (Signed)
Pt given lab results with instructions to increase prescribed Methimazole to 20 mg tab twice daily instead of once daily.  [facility]

## 2014-12-23 NOTE — Progress Notes (Signed)
Patient was seen on 12/20/14 for the complete diabetes self-management series at the [facility]. This is a part of the [facility] [employer].  Handouts given during class include:  Living Well with Diabetes book  Carb Counting and Meal Planning book  Meal Plan Card  Carbohydrate guide  Meal planning worksheet  Low Sodium Flavoring Tips  The diabetes portion plate  Low Carbohydrate Snack Suggestions  A1c to eAG Conversion Chart  Diabetes Medications  Stress Management  Diabetes Recommended Care Schedule  Diabetes Success Plan  Core Class Satisfaction Survey  The following learning objectives were met by the patient during this course:  Describe diabetes  State some common risk factors for diabetes  Defines the role of glucose and insulin  Identifies type of diabetes and pathophysiology  Describe the relationship between diabetes and cardiovascular risk  State the members of the Healthcare Team  States the rationale for glucose monitoring  State when to test glucose  State their individual Target Range  State the importance of logging glucose readings  Describe how to interpret glucose readings  Identifies A1C target  Explain the correlation between A1c and eAG values  State symptoms and treatment of high blood glucose  State symptoms and treatment of low blood glucose  Explain proper technique for glucose testing  Identifies proper sharps disposal  Describe the role of different macronutrients on glucose  Explain how carbohydrates affect blood glucose  State what foods contain the most carbohydrates  Demonstrate carbohydrate counting  Demonstrate how to read Nutrition Facts food label  Describe effects of various fats on heart health  Describe the importance of good nutrition for health and healthy eating strategies  Describe techniques for managing your shopping, cooking and meal planning  List  strategies to follow meal plan when dining out  Describe the effects of alcohol on glucose and how to use it safely . State the amount of activity recommended for healthy living . Describe activities suitable for individual needs . Identify ways to regularly incorporate activity into daily life . Identify barriers to activity and ways to over come these barriers  Identify diabetes medications being personally used and their primary action for lowering glucose and possible side effects . Describe role of stress on blood glucose and develop strategies to address psychosocial issues . Identify diabetes complications and ways to prevent them  Explain how to manage diabetes during illness . Evaluate success in meeting personal goal . Establish 2-3 goals that they will plan to diligently work on until they return for the  72-month follow-up visit  Goals:   Read nutrition labels  I will be active 30 minutes or more 5 times a week  I will take my diabetes medications as scheduled  I will eat less sugar  Log my glucose readings  Your patient has identified these potential barriers to change:  Lack of Family Support  Your patient has identified their diabetes self-care support plan as  [facility]  Plan: Follow up with [facility] [place] Coordinator

## 2014-12-23 NOTE — Progress Notes (Signed)
Pt given u/s results with blood work

## 2014-12-29 ENCOUNTER — Ambulatory Visit: Payer: 59 | Attending: Internal Medicine | Admitting: *Deleted

## 2014-12-29 VITALS — BP 154/74 | HR 114 | Temp 98.2°F | Resp 16

## 2014-12-29 DIAGNOSIS — E119 Type 2 diabetes mellitus without complications: Secondary | ICD-10-CM | POA: Diagnosis not present

## 2014-12-29 DIAGNOSIS — I10 Essential (primary) hypertension: Secondary | ICD-10-CM | POA: Diagnosis present

## 2014-12-29 DIAGNOSIS — E1165 Type 2 diabetes mellitus with hyperglycemia: Secondary | ICD-10-CM

## 2014-12-29 DIAGNOSIS — Z794 Long term (current) use of insulin: Secondary | ICD-10-CM | POA: Insufficient documentation

## 2014-12-29 NOTE — Progress Notes (Signed)
Patient presents for BP check, CBG and record review for T2DM, however, patient did not bring log or meter Med list reviewed; patient reports taking all meds as directed except thought she was to stop tenormin at last OV. Also states taking 70/30 insulin at breakfast and qHS. Advised to take before breakfast and before evening meal and eat within 5 minutes to prevent hypoglycemia Patient denies any hypoglycemic episodes Patient reports AM fasting blood sugars ranging 102-130 Patient reports before dinner blood sugars ranging 140-290 Patient given blood sugar log and instructed on use. Instructed to bring to all future visits. Denies increased thirst and urination Walking 20 minutes twice weekly; encouraged to increase to 30 minutes daily as tolerated  Patient ate 1 hour ago so CBG not repeated   Lab Results  Component Value Date   HGBA1C 10.1 12/09/2014   Filed Vitals:   12/29/14 1418  BP: 154/74  Pulse: 114  Temp: 98.2 F (36.8 C)  Resp: 16     Per PCP: Restart tenormin 75 mg daily  Patient advised to call for med refills at least 7 days before running out so as not to go without.  Patient to return in 2 weeks for nurse visit for BP check, CBG and record review  Patient given literature on DASH Eating Plan, Diabetes and Food, Diabetes and Exercise, Basic Carb Counting, Diabetes and Foot Care and Hyperthyroidism

## 2014-12-29 NOTE — Patient Instructions (Signed)
Hyperthyroidism The thyroid is a large gland located in the lower front part of your neck. The thyroid helps control metabolism. Metabolism is how your body uses food. It controls metabolism with the hormone thyroxine. When the thyroid is overactive, it produces too much hormone. When this happens, these following problems may occur:   Nervousness  Heat intolerance  Weight loss (in spite of increase food intake)  Diarrhea  Change in hair or skin texture  Palpitations (heart skipping or having extra beats)  Tachycardia (rapid heart rate)  Loss of menstruation (amenorrhea)  Shaking of the hands CAUSES  Grave's Disease (the immune system attacks the thyroid gland). This is the most common cause.  Inflammation of the thyroid gland.  Tumor (usually benign) in the thyroid gland or elsewhere.  Excessive use of thyroid medications (both prescription and 'natural').  Excessive ingestion of Iodine. DIAGNOSIS  To prove hyperthyroidism, your caregiver may do blood tests and ultrasound tests. Sometimes the signs are hidden. It may be necessary for your caregiver to watch this illness with blood tests, either before or after diagnosis and treatment. TREATMENT Short-term treatment There are several treatments to control symptoms. Drugs called beta blockers may give some relief. Drugs that decrease hormone production will provide temporary relief in many people. These measures will usually not give permanent relief. Definitive therapy There are treatments available which can be discussed between you and your caregiver which will permanently treat the problem. These treatments range from surgery (removal of the thyroid), to the use of radioactive iodine (destroys the thyroid by radiation), to the use of antithyroid drugs (interfere with hormone synthesis). The first two treatments are permanent and usually successful. They most often require hormone replacement therapy for life. This is because  it is impossible to remove or destroy the exact amount of thyroid required to make a person euthyroid (normal). HOME CARE INSTRUCTIONS  See your caregiver if the problems you are being treated for get worse. Examples of this would be the problems listed above. SEEK MEDICAL CARE IF: Your general condition worsens. MAKE SURE YOU:   Understand these instructions.  Will watch your condition.  Will get help right away if you are not doing well or get worse. Document Released: 08/01/2005 Document Revised: 10/24/2011 Document Reviewed: 12/13/2006 [facility] Patient Information 2015 [place], [place]. This information is not intended to replace advice given to you by your health care provider. Make sure you discuss any questions you have with your health care provider. Diabetes Mellitus and Food It is important for you to manage your blood sugar (glucose) level. Your blood glucose level can be greatly affected by what you eat. Eating healthier foods in the appropriate amounts throughout the day at about the same time each day will help you control your blood glucose level. It can also help slow or prevent worsening of your diabetes mellitus. Healthy eating may even help you improve the level of your blood pressure and reach or maintain a healthy weight.  HOW CAN FOOD AFFECT ME? Carbohydrates Carbohydrates affect your blood glucose level more than any other type of food. Your dietitian will help you determine how many carbohydrates to eat at each meal and teach you how to count carbohydrates. Counting carbohydrates is important to keep your blood glucose at a healthy level, especially if you are using insulin or taking certain medicines for diabetes mellitus. Alcohol Alcohol can cause sudden decreases in blood glucose (hypoglycemia), especially if you use insulin or take certain medicines for diabetes mellitus. Hypoglycemia can  be a life-threatening condition. Symptoms of hypoglycemia (sleepiness, dizziness,  and disorientation) are similar to symptoms of having too much alcohol.  If your health care provider has given you approval to drink alcohol, do so in moderation and use the following guidelines:  Women should not have more than one drink per day, and men should not have more than two drinks per day. One drink is equal to:  12 oz of beer.  5 oz of wine.  1 oz of hard liquor.  Do not drink on an empty stomach.  Keep yourself hydrated. Have water, diet soda, or unsweetened iced tea.  Regular soda, juice, and other mixers might contain a lot of carbohydrates and should be counted. WHAT FOODS ARE NOT RECOMMENDED? As you make food choices, it is important to remember that all foods are not the same. Some foods have fewer nutrients per serving than other foods, even though they might have the same number of calories or carbohydrates. It is difficult to get your body what it needs when you eat foods with fewer nutrients. Examples of foods that you should avoid that are high in calories and carbohydrates but low in nutrients include:  Trans fats (most processed foods list trans fats on the Nutrition Facts label).  Regular soda.  Juice.  Candy.  Sweets, such as cake, pie, doughnuts, and cookies.  Fried foods. WHAT FOODS CAN I EAT? Have nutrient-rich foods, which will nourish your body and keep you healthy. The food you should eat also will depend on several factors, including:  The calories you need.  The medicines you take.  Your weight.  Your blood glucose level.  Your blood pressure level.  Your cholesterol level. You also should eat a variety of foods, including:  Protein, such as meat, poultry, fish, tofu, nuts, and seeds (lean animal proteins are best).  Fruits.  Vegetables.  Dairy products, such as milk, cheese, and yogurt (low fat is best).  Breads, grains, pasta, cereal, rice, and beans.  Fats such as olive oil, trans fat-free margarine, canola oil, avocado,  and olives. DOES EVERYONE WITH DIABETES MELLITUS HAVE THE SAME MEAL PLAN? Because every person with diabetes mellitus is different, there is not one meal plan that works for everyone. It is very important that you meet with a dietitian who will help you create a meal plan that is just right for you. Document Released: 04/28/2005 Document Revised: 08/06/2013 Document Reviewed: 06/28/2013 [facility] Patient Information 2015 [place]. This information is not intended to replace advice given to you by your health care provider. Make sure you discuss any questions you have with your health care provider. Diabetes and Foot Care Diabetes may cause you to have problems because of poor blood supply (circulation) to your feet and legs. This may cause the skin on your feet to become thinner, break easier, and heal more slowly. Your skin may become dry, and the skin may peel and crack. You may also have nerve damage in your legs and feet causing decreased feeling in them. You may not notice minor injuries to your feet that could lead to infections or more serious problems. Taking care of your feet is one of the most important things you can do for yourself.  HOME CARE INSTRUCTIONS  Wear shoes at all times, even in the house. Do not go barefoot. Bare feet are easily injured.  Check your feet daily for blisters, cuts, and redness. If you cannot see the bottom of your feet, use a mirror  or ask someone for help.  Wash your feet with warm water (do not use hot water) and mild soap. Then pat your feet and the areas between your toes until they are completely dry. Do not soak your feet as this can dry your skin.  Apply a moisturizing lotion or petroleum jelly (that does not contain alcohol and is unscented) to the skin on your feet and to dry, brittle toenails. Do not apply lotion between your toes.  Trim your toenails straight across. Do not dig under them or around the cuticle. File the edges of your nails with  an emery board or nail file.  Do not cut corns or calluses or try to remove them with medicine.  Wear clean socks or stockings every day. Make sure they are not too tight. Do not wear knee-high stockings since they may decrease blood flow to your legs.  Wear shoes that fit properly and have enough cushioning. To break in new shoes, wear them for just a few hours a day. This prevents you from injuring your feet. Always look in your shoes before you put them on to be sure there are no objects inside.  Do not cross your legs. This may decrease the blood flow to your feet.  If you find a minor scrape, cut, or break in the skin on your feet, keep it and the skin around it clean and dry. These areas may be cleansed with mild soap and water. Do not cleanse the area with peroxide, alcohol, or iodine.  When you remove an adhesive bandage, be sure not to damage the skin around it.  If you have a wound, look at it several times a day to make sure it is healing.  Do not use heating pads or hot water bottles. They may burn your skin. If you have lost feeling in your feet or legs, you may not know it is happening until it is too late.  Make sure your health care provider performs a complete foot exam at least annually or more often if you have foot problems. Report any cuts, sores, or bruises to your health care provider immediately. SEEK MEDICAL CARE IF:   You have an injury that is not healing.  You have cuts or breaks in the skin.  You have an ingrown nail.  You notice redness on your legs or feet.  You feel burning or tingling in your legs or feet.  You have pain or cramps in your legs and feet.  Your legs or feet are numb.  Your feet always feel cold. SEEK IMMEDIATE MEDICAL CARE IF:   There is increasing redness, swelling, or pain in or around a wound.  There is a red line that goes up your leg.  Pus is coming from a wound.  You develop a fever or as directed by your health care  provider.  You notice a bad smell coming from an ulcer or wound. Document Released: 07/29/2000 Document Revised: 04/03/2013 Document Reviewed: 01/08/2013 [facility] Patient Information 2015 [place], [place]. This information is not intended to replace advice given to you by your health care provider. Make sure you discuss any questions you have with your health care provider. Diabetes and Exercise Exercising regularly is important. It is not just about losing weight. It has many health benefits, such as:  Improving your overall fitness, flexibility, and endurance.  Increasing your bone density.  Helping with weight control.  Decreasing your body fat.  Increasing your muscle strength.  Reducing  stress and tension.  Improving your overall health. People with diabetes who exercise gain additional benefits because exercise:  Reduces appetite.  Improves the body's use of blood sugar (glucose).  Helps lower or control blood glucose.  Decreases blood pressure.  Helps control blood lipids (such as cholesterol and triglycerides).  Improves the body's use of the hormone insulin by:  Increasing the body's insulin sensitivity.  Reducing the body's insulin needs.  Decreases the risk for heart disease because exercising:  Lowers cholesterol and triglycerides levels.  Increases the levels of good cholesterol (such as high-density lipoproteins [HDL]) in the body.  Lowers blood glucose levels. YOUR ACTIVITY PLAN  Choose an activity that you enjoy and set realistic goals. Your health care provider or diabetes educator can help you make an activity plan that works for you. Exercise regularly as directed by your health care provider. This includes:  Performing resistance training twice a week such as push-ups, sit-ups, lifting weights, or using resistance bands.  Performing 150 minutes of cardio exercises each week such as walking, running, or playing sports.  Staying active and  spending no more than 90 minutes at one time being inactive. Even short bursts of exercise are good for you. Three 10-minute sessions spread throughout the day are just as beneficial as a single 30-minute session. Some exercise ideas include:  Taking the dog for a walk.  Taking the stairs instead of the elevator.  Dancing to your favorite song.  Doing an exercise video.  Doing your favorite exercise with a friend. RECOMMENDATIONS FOR EXERCISING WITH TYPE 1 OR TYPE 2 DIABETES   Check your blood glucose before exercising. If blood glucose levels are greater than 240 mg/dL, check for urine ketones. Do not exercise if ketones are present.  Avoid injecting insulin into areas of the body that are going to be exercised. For example, avoid injecting insulin into:  The arms when playing tennis.  The legs when jogging.  Keep a record of:  Food intake before and after you exercise.  Expected peak times of insulin action.  Blood glucose levels before and after you exercise.  The type and amount of exercise you have done.  Review your records with your health care provider. Your health care provider will help you to develop guidelines for adjusting food intake and insulin amounts before and after exercising.  If you take insulin or oral hypoglycemic agents, watch for signs and symptoms of hypoglycemia. They include:  Dizziness.  Shaking.  Sweating.  Chills.  Confusion.  Drink plenty of water while you exercise to prevent dehydration or heat stroke. Body water is lost during exercise and must be replaced.  Talk to your health care provider before starting an exercise program to make sure it is safe for you. Remember, almost any type of activity is better than none. Document Released: 10/22/2003 Document Revised: 12/16/2013 Document Reviewed: 01/08/2013 [facility] Patient Information 2015 [place], [place]. This information is not intended to replace advice given to you by your health  care provider. Make sure you discuss any questions you have with your health care provider. Basic Carbohydrate Counting for Diabetes Mellitus Carbohydrate counting is a method for keeping track of the amount of carbohydrates you eat. Eating carbohydrates naturally increases the level of sugar (glucose) in your blood, so it is important for you to know the amount that is okay for you to have in every meal. Carbohydrate counting helps keep the level of glucose in your blood within normal limits. The amount of carbohydrates  allowed is different for every person. A dietitian can help you calculate the amount that is right for you. Once you know the amount of carbohydrates you can have, you can count the carbohydrates in the foods you want to eat. Carbohydrates are found in the following foods:  Grains, such as breads and cereals.  Dried beans and soy products.  Starchy vegetables, such as potatoes, peas, and corn.  Fruit and fruit juices.  Milk and yogurt.  Sweets and snack foods, such as cake, cookies, candy, chips, soft drinks, and fruit drinks. CARBOHYDRATE COUNTING There are two ways to count the carbohydrates in your food. You can use either of the methods or a combination of both. Reading the "Nutrition Facts" on Packaged Food The "Nutrition Facts" is an area that is included on the labels of almost all packaged food and beverages in the [place]. It includes the serving size of that food or beverage and information about the nutrients in each serving of the food, including the grams (g) of carbohydrate per serving.  Decide the number of servings of this food or beverage that you will be able to eat or drink. Multiply that number of servings by the number of grams of carbohydrate that is listed on the label for that serving. The total will be the amount of carbohydrates you will be having when you eat or drink this food or beverage. Learning Standard Serving Sizes of Food When you eat  food that is not packaged or does not include "Nutrition Facts" on the label, you need to measure the servings in order to count the amount of carbohydrates.A serving of most carbohydrate-rich foods contains about 15 g of carbohydrates. The following list includes serving sizes of carbohydrate-rich foods that provide 15 g ofcarbohydrate per serving:   1 slice of bread (1 oz) or 1 six-inch tortilla.    of a hamburger bun or English muffin.  4-6 crackers.   cup unsweetened dry cereal.    cup hot cereal.   cup rice or pasta.    cup mashed potatoes or  of a large baked potato.  1 cup fresh fruit or one small piece of fruit.    cup canned or frozen fruit or fruit juice.  1 cup milk.   cup plain fat-free yogurt or yogurt sweetened with artificial sweeteners.   cup cooked dried beans or starchy vegetable, such as peas, corn, or potatoes.  Decide the number of standard-size servings that you will eat. Multiply that number of servings by 15 (the grams of carbohydrates in that serving). For example, if you eat 2 cups of strawberries, you will have eaten 2 servings and 30 g of carbohydrates (2 servings x 15 g = 30 g). For foods such as soups and casseroles, in which more than one food is mixed in, you will need to count the carbohydrates in each food that is included. EXAMPLE OF CARBOHYDRATE COUNTING Sample Dinner  3 oz chicken breast.   cup of brown rice.   cup of corn.  1 cup milk.   1 cup strawberries with sugar-free whipped topping.  Carbohydrate Calculation Step 1: Identify the foods that contain carbohydrates:   Rice.   Corn.   Milk.   Strawberries. Step 2:Calculate the number of servings eaten of each:   2 servings of rice.   1 serving of corn.   1 serving of milk.   1 serving of strawberries. Step 3: Multiply each of those number of servings by 15 g:  2 servings of rice x 15 g = 30 g.   1 serving of corn x 15 g = 15 g.   1  serving of milk x 15 g = 15 g.   1 serving of strawberries x 15 g = 15 g. Step 4: Add together all of the amounts to find the total grams of carbohydrates eaten: 30 g + 15 g + 15 g + 15 g = 75 g. Document Released: 08/01/2005 Document Revised: 12/16/2013 Document Reviewed: 06/28/2013 [facility] Patient Information 2015 [place], [place]. This information is not intended to replace advice given to you by your health care provider. Make sure you discuss any questions you have with your health care provider. DASH Eating Plan DASH stands for "Dietary Approaches to Stop Hypertension." The DASH eating plan is a healthy eating plan that has been shown to reduce high blood pressure (hypertension). Additional health benefits may include reducing the risk of type 2 diabetes mellitus, heart disease, and stroke. The DASH eating plan may also help with weight loss. WHAT DO I NEED TO KNOW ABOUT THE DASH EATING PLAN? For the DASH eating plan, you will follow these general guidelines:  Choose foods with a percent daily value for sodium of less than 5% (as listed on the food label).  Use salt-free seasonings or herbs instead of table salt or sea salt.  Check with your health care provider or pharmacist before using salt substitutes.  Eat lower-sodium products, often labeled as "lower sodium" or "no salt added."  Eat fresh foods.  Eat more vegetables, fruits, and low-fat dairy products.  Choose whole grains. Look for the word "whole" as the first word in the ingredient list.  Choose fish and skinless chicken or [place] more often than red meat. Limit fish, poultry, and meat to 6 oz (170 g) each day.  Limit sweets, desserts, sugars, and sugary drinks.  Choose heart-healthy fats.  Limit cheese to 1 oz (28 g) per day.  Eat more home-cooked food and less restaurant, buffet, and fast food.  Limit fried foods.  Cook foods using methods other than frying.  Limit canned vegetables. If you do use them, rinse  them well to decrease the sodium.  When eating at a restaurant, ask that your food be prepared with less salt, or no salt if possible. WHAT FOODS CAN I EAT? Seek help from a dietitian for individual calorie needs. Grains Whole grain or whole wheat bread. Brown rice. Whole grain or whole wheat pasta. Quinoa, bulgur, and whole grain cereals. Low-sodium cereals. Corn or whole wheat flour tortillas. Whole grain cornbread. Whole grain crackers. Low-sodium crackers. Vegetables Fresh or frozen vegetables (raw, steamed, roasted, or grilled). Low-sodium or reduced-sodium tomato and vegetable juices. Low-sodium or reduced-sodium tomato sauce and paste. Low-sodium or reduced-sodium canned vegetables.  Fruits All fresh, canned (in natural juice), or frozen fruits. Meat and Other Protein Products Ground beef (85% or leaner), grass-fed beef, or beef trimmed of fat. Skinless chicken or [place]. Ground chicken or [place]. Pork trimmed of fat. All fish and seafood. Eggs. Dried beans, peas, or lentils. Unsalted nuts and seeds. Unsalted canned beans. Dairy Low-fat dairy products, such as skim or 1% milk, 2% or reduced-fat cheeses, low-fat ricotta or cottage cheese, or plain low-fat yogurt. Low-sodium or reduced-sodium cheeses. Fats and Oils Tub margarines without trans fats. Light or reduced-fat mayonnaise and salad dressings (reduced sodium). Avocado. Safflower, olive, or canola oils. Natural peanut or almond butter. Other Unsalted popcorn and pretzels. The items listed above may not  be a complete list of recommended foods or beverages. Contact your dietitian for more options. WHAT FOODS ARE NOT RECOMMENDED? Grains White bread. White pasta. White rice. Refined cornbread. Bagels and croissants. Crackers that contain trans fat. Vegetables Creamed or fried vegetables. Vegetables in a cheese sauce. Regular canned vegetables. Regular canned tomato sauce and paste. Regular tomato and vegetable juices. Fruits Dried  fruits. Canned fruit in light or heavy syrup. Fruit juice. Meat and Other Protein Products Fatty cuts of meat. Ribs, chicken wings, bacon, sausage, bologna, salami, chitterlings, fatback, hot dogs, bratwurst, and packaged luncheon meats. Salted nuts and seeds. Canned beans with salt. Dairy Whole or 2% milk, cream, half-and-half, and cream cheese. Whole-fat or sweetened yogurt. Full-fat cheeses or blue cheese. Nondairy creamers and whipped toppings. Processed cheese, cheese spreads, or cheese curds. Condiments Onion and garlic salt, seasoned salt, table salt, and sea salt. Canned and packaged gravies. Worcestershire sauce. Tartar sauce. Barbecue sauce. Teriyaki sauce. Soy sauce, including reduced sodium. Steak sauce. Fish sauce. Oyster sauce. Cocktail sauce. Horseradish. Ketchup and mustard. Meat flavorings and tenderizers. Bouillon cubes. Hot sauce. Tabasco sauce. Marinades. Taco seasonings. Relishes. Fats and Oils Butter, stick margarine, lard, shortening, ghee, and bacon fat. Coconut, palm kernel, or palm oils. Regular salad dressings. Other Pickles and olives. Salted popcorn and pretzels. The items listed above may not be a complete list of foods and beverages to avoid. Contact your dietitian for more information. WHERE CAN I FIND MORE INFORMATION? National Heart, Lung, and Blood Institute: [employer].itwww.nhlbi.nih.gov/health/health-topics/topics/dash/ Document Released: 07/21/2011 Document Revised: 12/16/2013 Document Reviewed: 06/05/2013 [facility] Patient Information 2015 [place], [place]. This information is not intended to replace advice given to you by your health care provider. Make sure you discuss any questions you have with your health care provider.

## 2015-01-13 ENCOUNTER — Other Ambulatory Visit: Payer: Self-pay | Admitting: Internal Medicine

## 2015-01-19 ENCOUNTER — Ambulatory Visit: Payer: 59 | Attending: Internal Medicine | Admitting: *Deleted

## 2015-01-19 VITALS — BP 107/68 | HR 84 | Temp 98.3°F | Resp 18 | Ht 64.0 in | Wt 282.2 lb

## 2015-01-19 DIAGNOSIS — E1165 Type 2 diabetes mellitus with hyperglycemia: Secondary | ICD-10-CM | POA: Diagnosis not present

## 2015-01-19 DIAGNOSIS — E119 Type 2 diabetes mellitus without complications: Secondary | ICD-10-CM | POA: Insufficient documentation

## 2015-01-19 DIAGNOSIS — I10 Essential (primary) hypertension: Secondary | ICD-10-CM

## 2015-01-19 DIAGNOSIS — Z794 Long term (current) use of insulin: Secondary | ICD-10-CM | POA: Diagnosis not present

## 2015-01-19 LAB — GLUCOSE, POCT (MANUAL RESULT ENTRY): POC Glucose: 190 mg/dl — AB (ref 70–99)

## 2015-01-19 MED ORDER — FREESTYLE LANCETS MISC: Status: AC

## 2015-01-19 MED ORDER — GLUCOSE BLOOD VI STRP: ORAL_STRIP | Status: DC

## 2015-01-19 NOTE — Progress Notes (Signed)
Patient presents for CBG and record review for T2DM, however, patient did not bring log or meter Med list reviewed; patient reports taking all meds as directed Taking 20 units 70/30 insulin bid AC Patient reports AM fasting blood sugars ranging 84-130 Patient reports before dinner blood sugars ranging 120-170 (states 2 readings in 220s) States has not checked BS in 5 days due to running out of strips Test strips and lancets e-scribed to pharmacy on record Patient understands importance of checking BS prior to injecting 70/30 insulin Denies increased thirst and urination States 1 episode of feeling low BS (shakiness and tired) last week; drank sweet tea and ate lunch and felt better. 15 minutes after lunch BS was 89  CBG 190 1 hour after eating donut  Lab Results  Component Value Date   HGBA1C 10.1 12/09/2014   Filed Vitals:   01/19/15 1133  BP: 107/68  Pulse: 84  Temp: 98.3 F (36.8 C)  Resp: 18     Per PCP: No changes to meds at this time F/u with PCP 3 months from last visit; Due 03/17/2015

## 2015-02-10 ENCOUNTER — Other Ambulatory Visit: Payer: Self-pay

## 2015-02-10 VITALS — BP 132/84 | HR 76 | Ht 63.0 in | Wt 289.6 lb

## 2015-02-10 DIAGNOSIS — E119 Type 2 diabetes mellitus without complications: Secondary | ICD-10-CM

## 2015-02-10 NOTE — Patient Outreach (Signed)
[facility]) Care Management   02/10/2015  Rhonda Baldwin 03/10/1981 [id number]  Rhonda Baldwin is an 34 y.o. female.   Member seen for follow up office visit for Link to Wellness program for self management of Type 2 diabetes  Subjective: States that she has been trying to take her insulin better and she is checking her blood sugars most days.  States she still struggles with her diet.  States she tends to over eat on her days off.  States that when she is at work she eats regularly and tries to get vegetables from the cafeteria. States she has cut back on eating rice and potatoes.  States that she saw her primary care provider and her blood pressure was up so they changed her medications some.  States that she also started her on Metformin.  States she has been tolerating the metformin well.  States she has had a few low readings before dinner.    Objective:   Review of Systems  All other systems reviewed and are negative. Member did not bring glucometer to visit  Physical Exam  Filed Vitals:   02/10/15 1510  BP: 132/84  Pulse: 76   Filed Weights   02/10/15 1510  Weight: 289 lb 9.6 oz (131.362 kg)    Current Medications:   Current Outpatient Prescriptions  Medication Sig Dispense Refill  . albuterol (PROVENTIL HFA;VENTOLIN HFA) 108 (90 BASE) MCG/ACT inhaler Inhale 1-2 puffs into the lungs every 6 (six) hours as needed for wheezing or shortness of breath. 3 Inhaler 3  . atenolol (TENORMIN) 50 MG tablet Take 1.5 tablets (75 mg total) by mouth daily. 90 tablet 3  . glucose blood test strip Use as instructed 100 each 12  . glucose monitoring kit (FREESTYLE) monitoring kit 1 each by Does not apply route as needed for other. 1 each 2  . insulin NPH-regular Human (NOVOLIN 70/30) (70-30) 100 UNIT/ML injection Inject 20 Units into the skin 2 (two) times daily with a meal. 3 vial 3  . ipratropium (ATROVENT) 0.06 % nasal spray Place 2 sprays into both nostrils 4 (four)  times daily. 15 mL 3  . Lancets (FREESTYLE) lancets Use as instructed 100 each 12  . lisinopril-hydrochlorothiazide (PRINZIDE,ZESTORETIC) 20-25 MG per tablet Take 1 tablet by mouth daily. 90 tablet 3  . loratadine (CLARITIN) 10 MG tablet Take 1 tablet (10 mg total) by mouth daily as needed for allergies. 30 tablet 3  . metFORMIN (GLUCOPHAGE) 1000 MG tablet Take 1 tablet (1,000 mg total) by mouth 2 (two) times daily with a meal. 180 tablet 3  . methimazole (TAPAZOLE) 10 MG tablet Take 2 tablets (20 mg total) by mouth 2 (two) times daily. 180 tablet 3  . naproxen sodium (ANAPROX) 220 MG tablet Take 440 mg by mouth 2 (two) times daily with a meal. As needed     No current facility-administered medications for this visit.    Functional Status:   In your present state of health, do you have any difficulty performing the following activities: 02/10/2015 12/09/2014  Hearing? N N  Vision? N N  Difficulty concentrating or making decisions? N N  Walking or climbing stairs? N N  Dressing or bathing? N N  Doing errands, shopping? N N  Preparing Food and eating ? N -  Using the Toilet? N -  In the past six months, have you accidently leaked urine? N -  Do you have problems with loss of bowel control? N -  Managing your Medications? N -  Housekeeping or managing your Housekeeping? N -    Fall/Depression Screening:    PHQ 2/9 Scores 02/10/2015 12/23/2014 12/09/2014 04/17/2014  PHQ - 2 Score 0 0 0 0   [facility] CM Care Plan Problem One        Patient Outreach from 02/10/2015 in [facility]   Care Plan Problem One  Elevated blood sugars related to dx of Type 2 DM as evidenced by hemoglobin A1C of 10.1   Care Plan for Problem One  Active   [facility] Long Term Goal (31-90 days)  Member will see decrease of hemoglobin A1C  to 8.5 within the next 90 days   [facility] Long Term Goal Start Date  12/09/14   Interventions for Problem One Long Term Goal  Reinforced instructions on CHO counting and portion control,  Discussed ways to control eating on the weekends,  Instructed on importance of checking her blood sugars twice a day, Instructed on s/s of hypoglycemia and actions to take, Encouraged to get glucotabs to carry with her , Reinforced  on importance of exercise for  glycemic control, Given voucher to get B/P monitor at pharmacy   [facility] CM Short Term Goal #1 (0-30 days)  Member will complete EMMI programs by 01/10/15   [facility] CM Short Term Goal #1 Start Date  12/09/14   [facility] CM Short Term Goal #1 Met Date  01/10/15 [Completed EMMI programs ]      Assessment:   Member seen for follow up office visit for Link to Wellness program for self management of Type 2 diabetes.  Member [facility]  She reports CBGs of 80-180 fasting and 70-220 before dinner and no post prandial readings.  She has not been exercising.  Reports eating less CHO but struggles on days off with diet.  Continues to have stress at home with being separated from husband but still living in same household.  Plan:   Plan to eat 30-45 GM (2-3) servings of carbohydrate a meal and 15 GM for snacks.   Plan to eat when you take your insulin Plan to check blood sugar twice a day fasting or 1 -2hrs after a meal.  Goals of 80-130 fasting and 180 or less 1 -2 hours after eating Plan to walk twice a week for 30 minutes.  Goal of 150 minutes a week Plan to call MD to make a follow up appointment in August Plan to pick up blood pressure monitor at pharmacy Plan to return to Link to Wellness on July 21 , 2016 at 3PM  Melissa Sandlin RN, BSN,CCM Care Management Coordinator-Link to Wellness [facility] Care Management [phone]    

## 2015-02-10 NOTE — Patient Instructions (Addendum)
1. Plan to eat 30-45 GM (2-3) servings of carbohydrate a meal and 15 GM for snacks.   2. Plan to eat when you take your insulin 3. Plan to check blood sugar twice a day fasting or 1 -2hrs after a meal.  Goals of 80-130 fasting and 180 or less 1 -2 hours after eating 4. Plan to walk twice a week for 30 minutes.  Goal of 150 minutes a week 5. Plan to call MD to make a follow up appointment in August 6. Plan to pick up blood pressure monitor at pharmacy 7. Plan to return to Link to Wellness on July 21 , 2016 at [facility]3PM

## 2015-02-13 ENCOUNTER — Other Ambulatory Visit: Payer: Self-pay | Admitting: Internal Medicine

## 2015-02-27 ENCOUNTER — Encounter (HOSPITAL_BASED_OUTPATIENT_CLINIC_OR_DEPARTMENT_OTHER): Payer: Self-pay | Admitting: Emergency Medicine

## 2015-02-27 ENCOUNTER — Emergency Department (HOSPITAL_BASED_OUTPATIENT_CLINIC_OR_DEPARTMENT_OTHER): Payer: 59

## 2015-02-27 ENCOUNTER — Emergency Department (HOSPITAL_BASED_OUTPATIENT_CLINIC_OR_DEPARTMENT_OTHER)
Admission: EM | Admit: 2015-02-27 | Discharge: 2015-02-27 | Disposition: A | Discharge status: Home / Self Care | Payer: 59 | Attending: Emergency Medicine | Admitting: Emergency Medicine

## 2015-02-27 DIAGNOSIS — I10 Essential (primary) hypertension: Secondary | ICD-10-CM | POA: Diagnosis not present

## 2015-02-27 DIAGNOSIS — Z79899 Other long term (current) drug therapy: Secondary | ICD-10-CM | POA: Insufficient documentation

## 2015-02-27 DIAGNOSIS — E119 Type 2 diabetes mellitus without complications: Secondary | ICD-10-CM | POA: Insufficient documentation

## 2015-02-27 DIAGNOSIS — Z794 Long term (current) use of insulin: Secondary | ICD-10-CM | POA: Insufficient documentation

## 2015-02-27 DIAGNOSIS — Z7951 Long term (current) use of inhaled steroids: Secondary | ICD-10-CM | POA: Insufficient documentation

## 2015-02-27 DIAGNOSIS — M79602 Pain in left arm: Secondary | ICD-10-CM | POA: Insufficient documentation

## 2015-02-27 MED ORDER — IBUPROFEN 800 MG PO TABS: 800.0000 mg | ORAL_TABLET | Freq: Three times a day (TID) | ORAL | Status: DC

## 2015-02-27 MED ORDER — HYDROCODONE-ACETAMINOPHEN 5-325 MG PO TABS: 1.0000 | ORAL_TABLET | Freq: Four times a day (QID) | ORAL | Status: DC | PRN

## 2015-02-27 MED ORDER — IBUPROFEN 800 MG PO TABS
800.0000 mg | ORAL_TABLET | Freq: Once | ORAL | Status: AC
  Administered 2015-02-27: 800 mg via ORAL
  Filled 2015-02-27: qty 1

## 2015-02-27 NOTE — ED Provider Notes (Signed)
CSN: [id number]     Arrival date & time 02/27/15  5093 History   None    Chief Complaint  Patient presents with  . Arm Pain     (Consider location/radiation/quality/duration/timing/severity/associated sxs/prior Treatment) HPI Comments: Intermittently having left shoulder pain described as throbbing radiating down to the wrist occasionally. Present for the past few weeks. Decreased range of motion, cannot lift her arm above her shoulder. No trauma  Patient is a 34 y.o. female presenting with arm pain. The history is provided by the patient.  Arm Pain This is a new problem. The current episode started more than 1 week ago. The problem occurs constantly. The problem has been gradually worsening. Pertinent negatives include no abdominal pain and no shortness of breath. Nothing aggravates the symptoms. Nothing relieves the symptoms.    Past Medical History  Diagnosis Date  . Hypertension   . Diabetes mellitus   . Thyroid disease    Past Surgical History  Procedure Laterality Date  . Cholecystectomy    . Cesarean section     Family History  Problem Relation Age of Onset  . Hypertension Mother   . Hypertension Father    History  Substance Use Topics  . Smoking status: Never Smoker   . Smokeless tobacco: Never Used  . Alcohol Use: No   OB History    No data available     Review of Systems  Constitutional: Negative for fever.  Respiratory: Negative for cough and shortness of breath.   Gastrointestinal: Negative for vomiting and abdominal pain.  All other systems reviewed and are negative.     Allergies  Orange  Home Medications   Prior to Admission medications   Medication Sig Start Date End Date Taking? Authorizing Provider  atenolol (TENORMIN) 50 MG tablet Take 1.5 tablets (75 mg total) by mouth daily. 12/15/14  Yes Tresa Garter, MD  glucose monitoring kit (FREESTYLE) monitoring kit 1 each by Does not apply route as needed for other. 08/23/13  Yes Reyne Dumas,  MD  Lancets (FREESTYLE) lancets Use as instructed 01/19/15  Yes Tresa Garter, MD  lisinopril-hydrochlorothiazide (PRINZIDE,ZESTORETIC) 20-25 MG per tablet Take 1 tablet by mouth daily. 12/15/14  Yes Tresa Garter, MD  loratadine (CLARITIN) 10 MG tablet Take 1 tablet (10 mg total) by mouth daily as needed for allergies. 12/15/14  Yes Tresa Garter, MD  metFORMIN (GLUCOPHAGE) 1000 MG tablet Take 1 tablet (1,000 mg total) by mouth 2 (two) times daily with a meal. 12/15/14  Yes Tresa Garter, MD  methimazole (TAPAZOLE) 10 MG tablet Take 2 tablets (20 mg total) by mouth 2 (two) times daily. 12/22/14  Yes Tresa Garter, MD  naproxen sodium (ANAPROX) 220 MG tablet Take 440 mg by mouth 2 (two) times daily with a meal. As needed   Yes Historical Provider, MD  albuterol (PROVENTIL HFA;VENTOLIN HFA) 108 (90 BASE) MCG/ACT inhaler Inhale 1-2 puffs into the lungs every 6 (six) hours as needed for wheezing or shortness of breath. 12/15/14   Tresa Garter, MD  HYDROcodone-acetaminophen (NORCO/VICODIN) 5-325 MG per tablet Take 1 tablet by mouth every 6 (six) hours as needed for moderate pain. 02/27/15   Evelina Bucy, MD  ibuprofen (ADVIL,MOTRIN) 800 MG tablet Take 1 tablet (800 mg total) by mouth 3 (three) times daily. 02/27/15   Evelina Bucy, MD  insulin NPH-regular Human (NOVOLIN 70/30) (70-30) 100 UNIT/ML injection Inject 20 Units into the skin 2 (two) times daily with a meal. 12/15/14   Olugbemiga  Essie Christine, MD  ipratropium (ATROVENT) 0.06 % nasal spray Place 2 sprays into both nostrils 4 (four) times daily. 12/15/14   Tresa Garter, MD  TRUEPLUS INSULIN SYRINGE 30G X 5/16" 0.5 ML MISC USE AS DIRECTED WITH INSULIN 02/13/15   Tresa Garter, MD   BP 151/78 mmHg  Pulse 82  Temp(Src) 98.4 F (36.9 C) (Oral)  Resp 18  Ht _0  (1.6 m)  Wt 285 lb (129.275 kg)  BMI 50.50 kg/m2  SpO2 99%  LMP 02/12/2015 Physical Exam  Constitutional: She is oriented to person, place, and time. She  appears well-developed and well-nourished. No distress.  HENT:  Head: Normocephalic and atraumatic.  Mouth/Throat: Oropharynx is clear and moist.  Eyes: EOM are normal. Pupils are equal, round, and reactive to light.  Neck: Normal range of motion. Neck supple.  Cardiovascular: Normal rate and regular rhythm.  Exam reveals no friction rub.   No murmur heard. Pulmonary/Chest: Effort normal and breath sounds normal. No respiratory distress. She has no wheezes. She has no rales.  Abdominal: Soft. She exhibits no distension. There is no tenderness. There is no rebound.  Musculoskeletal: She exhibits no edema.       Left shoulder: She exhibits decreased range of motion and tenderness (anterior deltoid). She exhibits no deformity, no laceration, no pain and no spasm.  Neurological: She is alert and oriented to person, place, and time. No cranial nerve deficit. She exhibits normal muscle tone. Coordination normal.  Skin: No rash noted. She is not diaphoretic.  Nursing note and vitals reviewed.   ED Course  Procedures (including critical care time) Labs Review Labs Reviewed - No data to display  Imaging Review Dg Shoulder Left  02/27/2015   CLINICAL DATA:  LEFT shoulder pain and swelling for 2 weeks. No injury.  EXAM: LEFT SHOULDER - 2+ VIEW  COMPARISON:  None.  FINDINGS: The humeral head is well-formed and located. The subacromial, glenohumeral and acromioclavicular joint spaces are intact. No destructive bony lesions. Soft tissue planes are non-suspicious.  IMPRESSION: Negative.   Electronically Signed   By: Elon Alas M.D.   On: 02/27/2015 06:58     EKG Interpretation None      MDM   Final diagnoses:  Left arm pain    34 year old female here with left shoulder pain. Present over the past few weeks. Worse with using the shoulder. She cannot raise it up over parallel to the ground. She describes a throbbing pain in her anterior shoulder that will occasionally down her arm. No  chest pain or shortness of breath. Patient here with stable vitals. Left shoulder with tenderness in the anterior deltoid. She has decreased range of motion even with passive range of motion. She is diabetic. Concern for musculoskeletal pain, possible frozen shoulder. She has no paresthesias, no coolness, no numbness. She hasn't lost any muscle mass. Doubt subclavian steal. As could be possibly early frozen shoulder since she is a diabetic. Given Motrin had a mild amount of pain medicine. We'll have her follow-up with sports medicine. X-rays normal.    Evelina Bucy, MD 02/27/15 0800

## 2015-02-27 NOTE — ED Notes (Signed)
Pt states that she has developed left shoulder pain with pain to left arm, states pain waxes/wanes, worse when she tries to lift arm abover her head

## 2015-02-27 NOTE — Discharge Instructions (Signed)

## 2015-03-05 ENCOUNTER — Other Ambulatory Visit: Payer: Self-pay

## 2015-03-05 VITALS — BP 132/76 | HR 78 | Resp 16 | Ht 63.0 in | Wt 292.0 lb

## 2015-03-05 DIAGNOSIS — E119 Type 2 diabetes mellitus without complications: Secondary | ICD-10-CM

## 2015-03-05 NOTE — Patient Outreach (Signed)
[facility]) Care Management   03/05/2015  Rhonda Baldwin [birth year] [id number]  Rhonda Baldwin is an 34 y.o. female.   Member seen for follow up office visit for Link to Wellness program for self management of Type 2 diabetes  Subjective: Member states she has been moving to a new apartment and she has not been taking care of her diabetes.  States she is taking her insulin and pills but she has not been checking her blood sugars.  States she has been eating fast food and not watching her CHOs.  States that the last time she checked her blood sugars the reading was in the 200's.  States she has not been exercising other than the apartment is on the second floor.  States this is not the apartment she will live in and she will move to her new apartment at the end of the month.  States she did pick up the blood pressure monitor at the pharmacy and she is checking her blood pressure a few times a week.  States that her readings have been good with none over 140/90.    Objective:   Review of Systems  Musculoskeletal: Positive for joint pain.  All other systems reviewed and are negative. Member did not bring glucometer to visit  Physical Exam  Today's Vitals   03/05/15 1512 03/05/15 1513  BP:  132/76  Pulse:  78  Resp:  16  Height: 1.6 m ($Remo'5\' 3"'JFFPF$ )   Weight: 292 lb (132.45 kg)   SpO2:  99%  PainSc:  2     Current Medications:   Current Outpatient Prescriptions  Medication Sig Dispense Refill  . albuterol (PROVENTIL HFA;VENTOLIN HFA) 108 (90 BASE) MCG/ACT inhaler Inhale 1-2 puffs into the lungs every 6 (six) hours as needed for wheezing or shortness of breath. 3 Inhaler 3  . atenolol (TENORMIN) 50 MG tablet Take 1.5 tablets (75 mg total) by mouth daily. 90 tablet 3  . HYDROcodone-acetaminophen (NORCO/VICODIN) 5-325 MG per tablet Take 1 tablet by mouth every 6 (six) hours as needed for moderate pain. 10 tablet 0  . ibuprofen (ADVIL,MOTRIN) 800 MG tablet Take 1 tablet (800 mg  total) by mouth 3 (three) times daily. (Patient taking differently: Take 800 mg by mouth every 8 (eight) hours as needed. ) 21 tablet 0  . insulin NPH-regular Human (NOVOLIN 70/30) (70-30) 100 UNIT/ML injection Inject 20 Units into the skin 2 (two) times daily with a meal. 3 vial 3  . ipratropium (ATROVENT) 0.06 % nasal spray Place 2 sprays into both nostrils 4 (four) times daily. 15 mL 3  . lisinopril-hydrochlorothiazide (PRINZIDE,ZESTORETIC) 20-25 MG per tablet Take 1 tablet by mouth daily. 90 tablet 3  . loratadine (CLARITIN) 10 MG tablet Take 1 tablet (10 mg total) by mouth daily as needed for allergies. 30 tablet 3  . metFORMIN (GLUCOPHAGE) 1000 MG tablet Take 1 tablet (1,000 mg total) by mouth 2 (two) times daily with a meal. 180 tablet 3  . methimazole (TAPAZOLE) 10 MG tablet Take 2 tablets (20 mg total) by mouth 2 (two) times daily. 180 tablet 3  . naproxen sodium (ANAPROX) 220 MG tablet Take 440 mg by mouth 2 (two) times daily with a meal. As needed    . TRUEPLUS INSULIN SYRINGE 30G X 5/16" 0.5 ML MISC USE AS DIRECTED WITH INSULIN 100 each 5  . glucose monitoring kit (FREESTYLE) monitoring kit 1 each by Does not apply route as needed for other. (Patient not taking: Reported  on 03/05/2015) 1 each 2  . Lancets (FREESTYLE) lancets Use as instructed (Patient not taking: Reported on 03/05/2015) 100 each 12   No current facility-administered medications for this visit.    Functional Status:   In your present state of health, do you have any difficulty performing the following activities: 03/05/2015 02/10/2015  Hearing? N N  Vision? N N  Difficulty concentrating or making decisions? N N  Walking or climbing stairs? N N  Dressing or bathing? N N  Doing errands, shopping? N N  Preparing Food and eating ? - N  Using the Toilet? - N  In the past six months, have you accidently leaked urine? - N  Do you have problems with loss of bowel control? - N  Managing your Medications? - N  Housekeeping  or managing your Housekeeping? - N    Fall/Depression Screening:    PHQ 2/9 Scores 03/05/2015 02/10/2015 12/23/2014 12/09/2014 04/17/2014  PHQ - 2 Score 0 0 0 0 0   [facility] CM Care Plan Problem One        Patient Outreach from 03/05/2015 in [place] Problem One  Elevated blood sugars related to dx of Type 2 DM as evidenced by hemoglobin A1C of 10.1   Care Plan for Problem One  Active   [facility]N Long Term Goal (31-90 days)  Member will see decrease of hemoglobin A1C  to 8.5 within the next 90 days   [facility]N Long Term Goal Start Date  03/05/15 Maylon Peppers has not had hemoglobin A1C checked not due]   Interventions for Problem One Long Term Goal  Reinforced instructions on CHO counting and portion control, Discussed making wise choices when eating out and to try to get back to making food at home when she is moved  Reinforced on importance of checking her blood sugars twice a day, Reinforced  on importance of exercise for  glycemic control, Instructed to call MD to schedule follow up appointment in August, Discussed with pharmacist at MD's office options that member can have for medications while active in Link to Wellness program       Assessment:   Member seen for follow up office visit for Link to [employer] for self management of Type 2 diabetes.  Member has not been following diet, monitoring and exercise recommendations for the last few weeks.  Member did not bring glucometer for review.  Reports eating fast foods and making poor choices with her foods.  Member reports she is taking her medications.  Member needs to follow up with provider in August for hemoglobin A1C.  Plan:  Plan to eat 30-45 GM (2-3) servings of carbohydrate a meal and 15 GM for snacks.   Plan to make wise choices when eating out. Plan to check blood sugar once or twice a day fasting or 1 -2hrs after a meal.  Goals of 80-130 fasting and 180 or less 1 -2 hours after eating Plan to walk twice a week for 30  minutes.  Goal of 150 minutes a week Plan to call MD to make a follow up appointment in August Plan to return to Link to Wellness on April 21 2015 at [facility] RN, [facility] Care Management Coordinator-Link to [place] Management [phone]

## 2015-03-05 NOTE — Patient Instructions (Signed)
1. Plan to eat 30-45 GM (2-3) servings of carbohydrate a meal and 15 GM for snacks.   2. Plan to make wise choices when eating out. 3. Plan to check blood sugar once or twice a day fasting or 1 -2hrs after a meal.  Goals of 80-130 fasting and 180 or less 1 -2 hours after eating 4. Plan to walk twice a week for 30 minutes.  Goal of 150 minutes a week 5. Plan to call MD to make a follow up appointment in August 6. Plan to return to Link to Wellness on April 21 2015 at [facility]

## 2015-03-27 IMAGING — CR DG CHEST 2V
2 series · 2 of 2 positions shown · non-contrast
Comparison: 01/10/2014 and earlier.

CLINICAL DATA: 33-year-old female with cough sore throat and fever
for 3 days. Initial encounter.

EXAM:
CHEST  2 VIEW

[w chest pa]
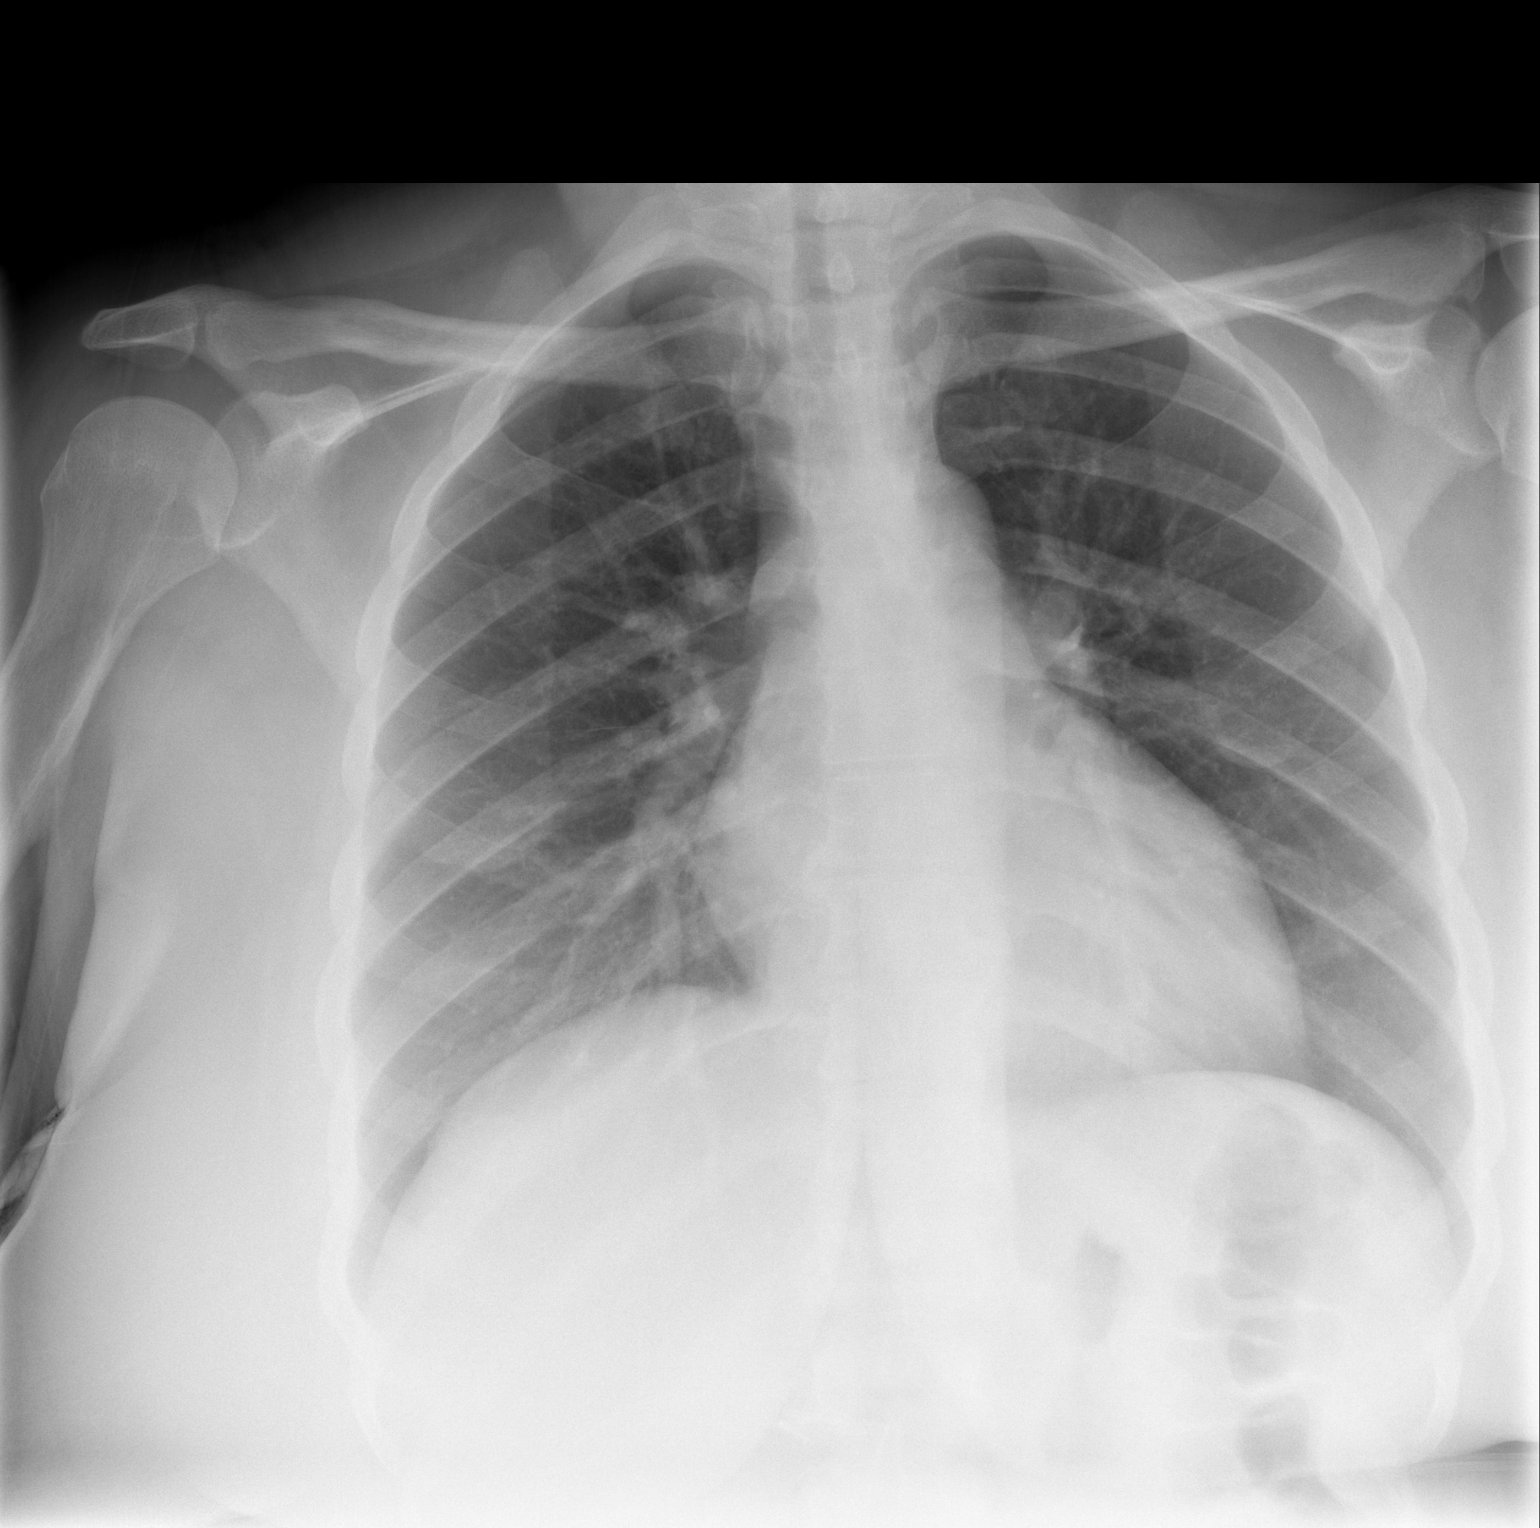

[w chest lat]
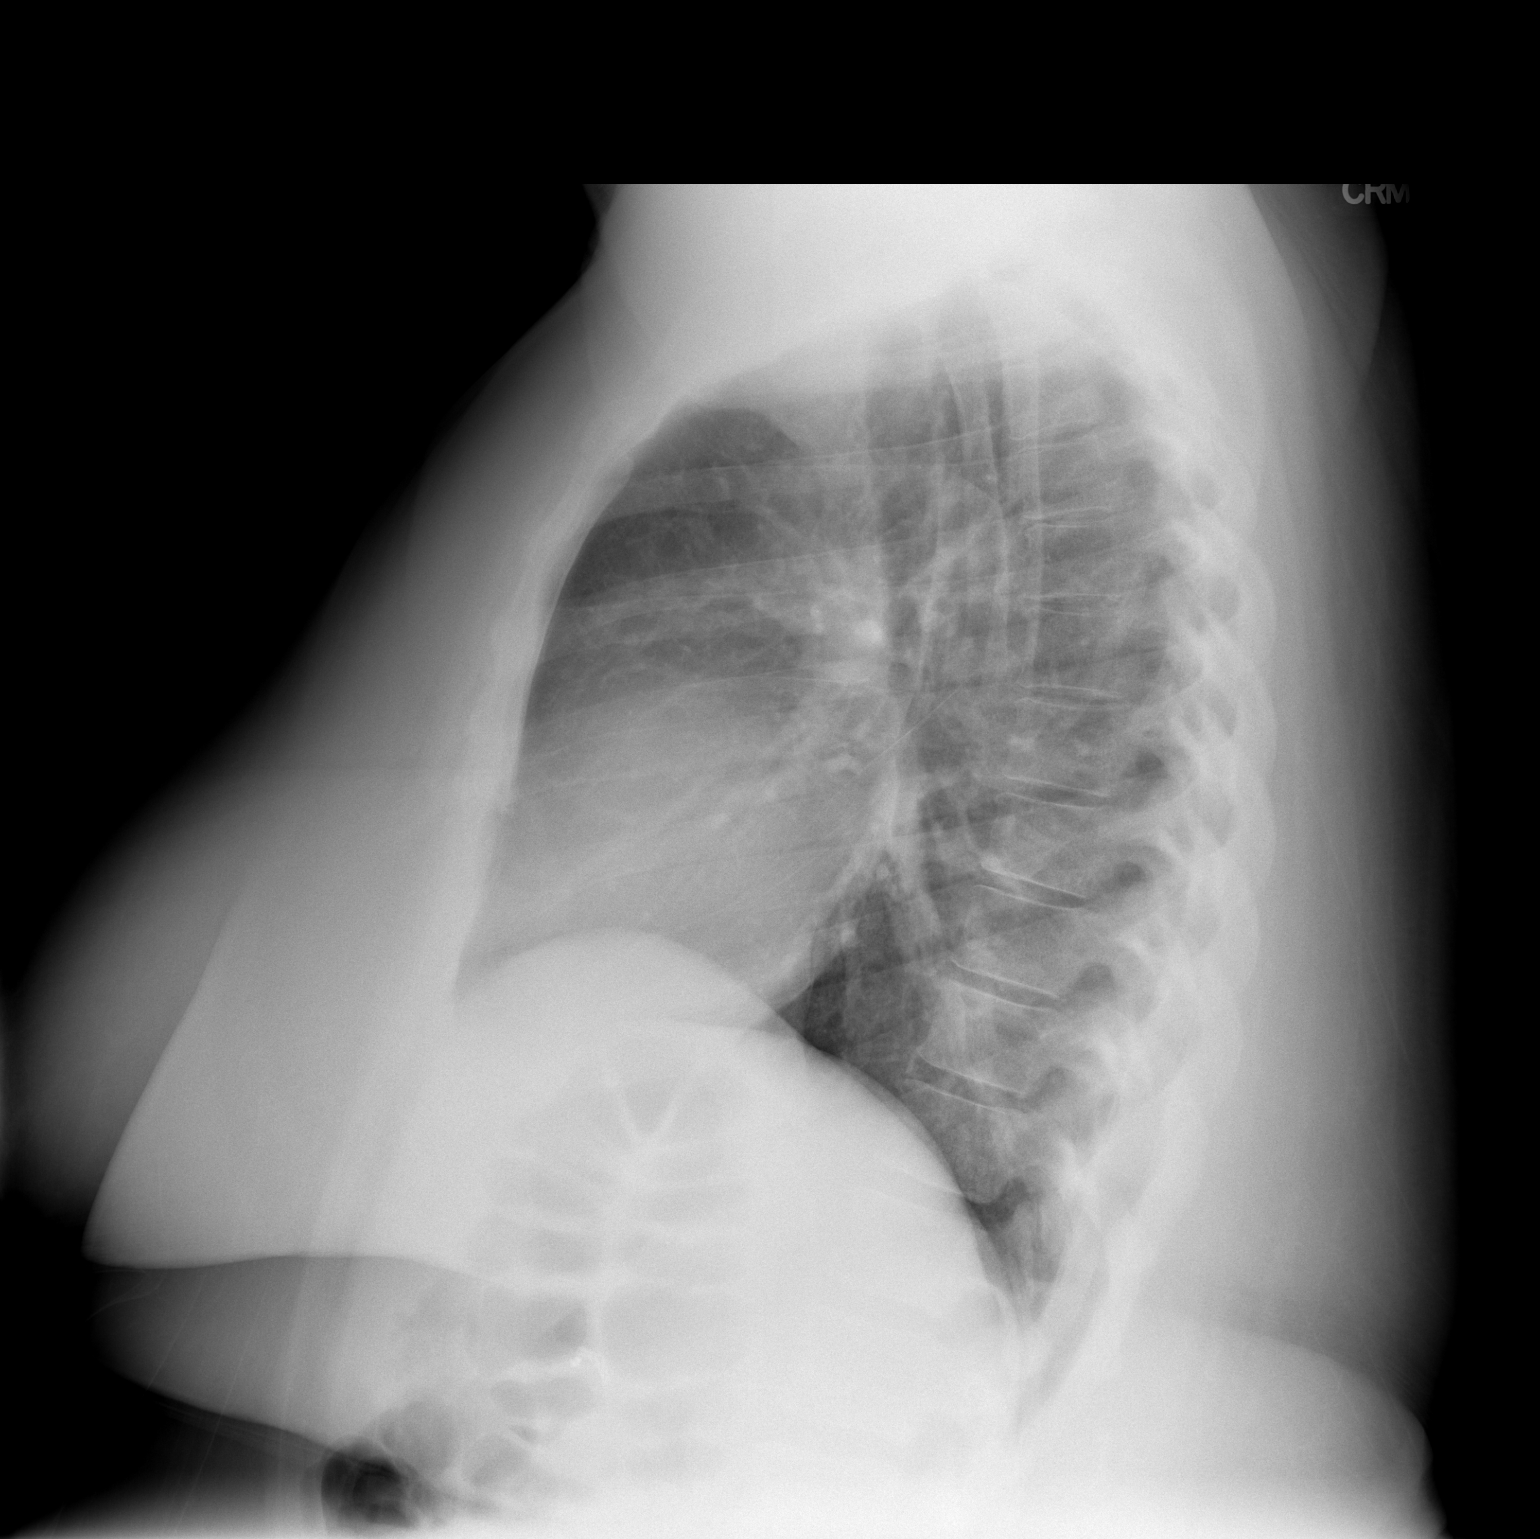

[2 of 2 positions shown; findings below may reference images not displayed]

FINDINGS: Large body habitus. Improved lung volumes, normal. Cardiac size at
the upper limits of normal. Other mediastinal contours are within
normal limits. Visualized tracheal air column is within normal
limits. No pneumothorax, pulmonary edema, pleural effusion or
confluent pulmonary opacity. No acute osseous abnormality
identified. Stable cholecystectomy clips.
IMPRESSION: Negative, no acute cardiopulmonary abnormality.

## 2015-04-21 ENCOUNTER — Ambulatory Visit: Payer: 59

## 2015-06-16 ENCOUNTER — Other Ambulatory Visit: Payer: Self-pay

## 2015-06-16 VITALS — BP 136/86 | HR 80 | Resp 16 | Ht 63.0 in | Wt 304.8 lb

## 2015-06-16 DIAGNOSIS — Z794 Long term (current) use of insulin: Principal | ICD-10-CM

## 2015-06-16 DIAGNOSIS — E119 Type 2 diabetes mellitus without complications: Secondary | ICD-10-CM

## 2015-06-16 LAB — POCT GLYCOSYLATED HEMOGLOBIN (HGB A1C): Hemoglobin A1C: 9.3

## 2015-06-16 NOTE — Patient Instructions (Signed)
1. Plan to eat 30-45 GM (2-3) servings of carbohydrate a meal and 15 GM for snacks.   2. Plan to have protein with your snacks 3. Plan to try using My Fitness Pal to count calories and CHO 4. Plan to check blood sugar once a day fasting or 1 -2hrs after a meal.  Goals of 80-130 fasting and 180 or less  -2 hours after eating 5. Plan to walk twice a week for 30 minutes.  Goal of 150 minutes a week 6. Plan to call MD to make a follow up appointment within the month          Plan to return to Link to Wellness on 08/11/15 at 3:30PM

## 2015-06-16 NOTE — Patient Outreach (Signed)
[facility]) Care Management   06/16/2015  Rhonda Baldwin April 01, 1981 [id number]  Rhonda Baldwin is an 34 y.o. female.   Member seen for follow up office visit for Link to Wellness program for self management of Type 2 diabetes  Subjective: Member states she has not seen her provider for months.  States she has difficulty finding time to schedule as she is now working 2 jobs.  States she has not been checking her blood sugars for about 1-2 months.  States she just forgets to take them.  States she does remember to take her medications and insulin.  States that she is working with her separated husband to try to get back together.  States that they have seen a Social worker.  States she does not eat too much but she does eat sweets at night.  Objective:   Review of Systems  Musculoskeletal: Positive for joint pain.  All other systems reviewed and are negative. POC hemoglobin A1C-9.3  Physical Exam  Today's Vitals   06/16/15 1532 06/16/15 1536  BP: 136/86   Pulse: 80   Resp: 16   Height: 1.6 m ('5\' 3"' )   Weight: 304 lb 12.8 oz (138.256 kg)   SpO2: 99%   PainSc: 2  2     Current Medications:   Current Outpatient Prescriptions  Medication Sig Dispense Refill  . albuterol (PROVENTIL HFA;VENTOLIN HFA) 108 (90 BASE) MCG/ACT inhaler Inhale 1-2 puffs into the lungs every 6 (six) hours as needed for wheezing or shortness of breath. 3 Inhaler 3  . atenolol (TENORMIN) 50 MG tablet Take 1.5 tablets (75 mg total) by mouth daily. 90 tablet 3  . ibuprofen (ADVIL,MOTRIN) 800 MG tablet Take 1 tablet (800 mg total) by mouth 3 (three) times daily. (Patient taking differently: Take 800 mg by mouth every 8 (eight) hours as needed. ) 21 tablet 0  . ipratropium (ATROVENT) 0.06 % nasal spray Place 2 sprays into both nostrils 4 (four) times daily. 15 mL 3  . lisinopril-hydrochlorothiazide (PRINZIDE,ZESTORETIC) 20-25 MG per tablet Take 1 tablet by mouth daily. 90 tablet 3  . loratadine  (CLARITIN) 10 MG tablet Take 1 tablet (10 mg total) by mouth daily as needed for allergies. 30 tablet 3  . metFORMIN (GLUCOPHAGE) 1000 MG tablet Take 1 tablet (1,000 mg total) by mouth 2 (two) times daily with a meal. 180 tablet 3  . methimazole (TAPAZOLE) 10 MG tablet Take 2 tablets (20 mg total) by mouth 2 (two) times daily. 180 tablet 3  . TRUEPLUS INSULIN SYRINGE 30G X 5/16" 0.5 ML MISC USE AS DIRECTED WITH INSULIN 100 each 5  . glucose monitoring kit (FREESTYLE) monitoring kit 1 each by Does not apply route as needed for other. (Patient not taking: Reported on 03/05/2015) 1 each 2  . HYDROcodone-acetaminophen (NORCO/VICODIN) 5-325 MG per tablet Take 1 tablet by mouth every 6 (six) hours as needed for moderate pain. (Patient not taking: Reported on 06/16/2015) 10 tablet 0  . insulin NPH-regular Human (NOVOLIN 70/30) (70-30) 100 UNIT/ML injection Inject 20 Units into the skin 2 (two) times daily with a meal. (Patient taking differently: Inject 25 Units into the skin 2 (two) times daily with a meal. ) 3 vial 3  . Lancets (FREESTYLE) lancets Use as instructed (Patient not taking: Reported on 03/05/2015) 100 each 12  . naproxen sodium (ANAPROX) 220 MG tablet Take 440 mg by mouth 2 (two) times daily with a meal. As needed     No current facility-administered medications  for this visit.    Functional Status:   In your present state of health, do you have any difficulty performing the following activities: 06/16/2015 03/05/2015  Hearing? N N  Vision? N N  Difficulty concentrating or making decisions? N N  Walking or climbing stairs? N N  Dressing or bathing? N N  Doing errands, shopping? N N  Preparing Food and eating ? - -  Using the Toilet? - -  In the past six months, have you accidently leaked urine? - -  Do you have problems with loss of bowel control? - -  Managing your Medications? - -  Housekeeping or managing your Housekeeping? - -    Fall/Depression Screening:    PHQ 2/9 Scores  06/16/2015 03/05/2015 02/10/2015 12/23/2014 12/09/2014 04/17/2014  PHQ - 2 Score 0 0 0 0 0 0    Assessment:   Member seen for follow up office visit for Link to Wellness program for self management of Type 2 diabetes.  Member continues to not met diabetes goal of hemoglobin A1C of 7 with result today of 9.3.  Member has not seen her provider since May.  Member reports having difficulty with sweets at night.  Member has not been exercising.  Reports adherence with medications. Member is over due for dilated eye exam. Plan:  1. Plan to eat 30-45 GM (2-3) servings of carbohydrate a meal and 15 GM for snacks.   2. Plan to have protein with your snacks 3. Plan to try using My Fitness Pal to count calories and CHO 4. Plan to check blood sugar once a day fasting or 1 -2hrs after a meal.  Goals of 80-130 fasting and 180 or less  -2 hours after eating 5. Plan to walk twice a week for 30 minutes.  Goal of 150 minutes a week 6. Plan to call MD to make a follow up appointment within the month 7. Plan to return to Link to Wellness on 08/11/15 at 3:30PM  [facility] CM Care Plan Problem One        Most Recent Value   Care Plan Problem One  Elevated blood sugars related to dx of Type 2 DM as evidenced by hemoglobin A1C of 10.1   Role Documenting the Problem One  Care Management [place] for Problem One  Active   THN Long Term Goal (31-90 days)  Member will see decrease of hemoglobin A1C  to 8.5 within the next 90 days   THN Long Term Goal Start Date  06/16/15 [Not meeting hemoglobin A1C goal last result 9.3 today]   Interventions for Problem One Long Term Goal  Reinforced on CHO counting and portion control, Instructed to try using My Fitness Pal to count calories to try to lose weight, Reinforced on importance of checking her blood sugars 1-2 times a day, Reinforced importance of having annual dilated eye exams, Reinforced  on importance of exercise for  glycemic control, Instructed to call MD to schedule  follow up appointment this month    Peter Garter RN, [facility] Care Management Coordinator-Link to [place] Management [phone]

## 2015-08-11 ENCOUNTER — Other Ambulatory Visit: Payer: Self-pay

## 2015-08-11 VITALS — BP 144/88 | HR 76 | Resp 16 | Ht 63.0 in | Wt 313.0 lb

## 2015-08-11 DIAGNOSIS — E119 Type 2 diabetes mellitus without complications: Secondary | ICD-10-CM

## 2015-08-11 DIAGNOSIS — Z794 Long term (current) use of insulin: Principal | ICD-10-CM

## 2015-08-11 NOTE — Patient Outreach (Signed)
[facility]) Care Management   08/11/2015  Rhonda Baldwin 22-Jan-1981 [id number]  Rhonda Baldwin is an 34 y.o. female.   Member seen for follow up office visit for Link to Wellness program for self management of Type 2 diabetes  Subjective: Member states that she has a new primary care provider at [place] states that her hemoglobin A1C was 9.7.  States that she started her on Levemir insulin which she is to increase until her blood sugars are 130 or less in the morning.  States she stopped increasing at 3 units as she was afraid she would have some low readings.  States she has been having higher readings for the last few weeks as she has been eating sweets over the holidays.  States she has not been exercising other than her work.  States she is still working 2 jobs to help pay off some bills.    Objective:   Review of Systems  Musculoskeletal: Positive for joint pain.  All other systems reviewed and are negative. Reviewed glucometer 14 day average-184 AM ranges-88-231 one reading of 424 post prandial(reports eating sweets)  Physical Exam Today's Vitals   08/11/15 1525 08/11/15 1529  BP: 144/88   Pulse: 76   Resp: 16   Height: 1.6 m (_0 )   Weight: 313 lb (141.976 kg)   SpO2: 100%   PainSc: 2  2    Current Medications:   Current Outpatient Prescriptions  Medication Sig Dispense Refill  . albuterol (PROVENTIL HFA;VENTOLIN HFA) 108 (90 BASE) MCG/ACT inhaler Inhale 1-2 puffs into the lungs every 6 (six) hours as needed for wheezing or shortness of breath. 3 Inhaler 3  . atenolol (TENORMIN) 50 MG tablet Take 1.5 tablets (75 mg total) by mouth daily. 90 tablet 3  . ibuprofen (ADVIL,MOTRIN) 800 MG tablet Take 1 tablet (800 mg total) by mouth 3 (three) times daily. (Patient taking differently: Take 800 mg by mouth every 8 (eight) hours as needed. ) 21 tablet 0  . insulin detemir (LEVEMIR) 100 UNIT/ML injection Inject 3 Units into the skin at bedtime.    . insulin  NPH-regular Human (NOVOLIN 70/30) (70-30) 100 UNIT/ML injection Inject 20 Units into the skin 2 (two) times daily with a meal. (Patient taking differently: Inject 25 Units into the skin 2 (two) times daily with a meal. ) 3 vial 3  . ipratropium (ATROVENT) 0.06 % nasal spray Place 2 sprays into both nostrils 4 (four) times daily. 15 mL 3  . lisinopril-hydrochlorothiazide (PRINZIDE,ZESTORETIC) 20-25 MG per tablet Take 1 tablet by mouth daily. 90 tablet 3  . loratadine (CLARITIN) 10 MG tablet Take 1 tablet (10 mg total) by mouth daily as needed for allergies. 30 tablet 3  . metFORMIN (GLUCOPHAGE-XR) 500 MG 24 hr tablet Take 500 mg by mouth 2 (two) times daily with a meal.    . methimazole (TAPAZOLE) 10 MG tablet Take 2 tablets (20 mg total) by mouth 2 (two) times daily. 180 tablet 3  . TRUEPLUS INSULIN SYRINGE 30G X 5/16" 0.5 ML MISC USE AS DIRECTED WITH INSULIN 100 each 5  . glucose monitoring kit (FREESTYLE) monitoring kit 1 each by Does not apply route as needed for other. (Patient not taking: Reported on 03/05/2015) 1 each 2  . HYDROcodone-acetaminophen (NORCO/VICODIN) 5-325 MG per tablet Take 1 tablet by mouth every 6 (six) hours as needed for moderate pain. (Patient not taking: Reported on 06/16/2015) 10 tablet 0  . Lancets (FREESTYLE) lancets Use as instructed (Patient not  taking: Reported on 03/05/2015) 100 each 12  . metFORMIN (GLUCOPHAGE) 1000 MG tablet Take 1 tablet (1,000 mg total) by mouth 2 (two) times daily with a meal. (Patient not taking: Reported on 08/11/2015) 180 tablet 3  . naproxen sodium (ANAPROX) 220 MG tablet Take 440 mg by mouth 2 (two) times daily with a meal. Reported on 08/11/2015     No current facility-administered medications for this visit.    Functional Status:   In your present state of health, do you have any difficulty performing the following activities: 08/11/2015 06/16/2015  Hearing? N N  Vision? N N  Difficulty concentrating or making decisions? N N  Walking or  climbing stairs? N N  Dressing or bathing? N N  Doing errands, shopping? N N  Preparing Food and eating ? - -  Using the Toilet? - -  In the past six months, have you accidently leaked urine? - -  Do you have problems with loss of bowel control? - -  Managing your Medications? - -  Housekeeping or managing your Housekeeping? - -    Fall/Depression Screening:    PHQ 2/9 Scores 08/11/2015 06/16/2015 03/05/2015 02/10/2015 12/23/2014 12/09/2014 04/17/2014  PHQ - 2 Score 0 0 0 0 0 0 0    Assessment:   Member seen for follow up office visit for Link to Wellness program for self management of Type 2 diabetes.  Member not meeting goal of hemoglobin A1C of less than 7% with last result 9.7.  Member now has new primary care provider and has follow up in March.  Member now taking Levemir insulin but she has not been increasing dose as ordered due to fear of hypoglycemia.  Member not adhering to low CHO diet or exercise.   Member has annual eye exam schedule in January.  Plan:   Plan to eat 30-45 GM (2-3) servings of carbohydrate a meal and 15 GM for snacks.   Plan to have protein with your snacks and avoid sweets Plan to try using Calorie [place] [place] to count calories and CHO Plan to check blood sugar once a day fasting or 1 -2hrs after a meal.  Goals of 80-130 fasting and 180 or less -2 hours after eating.  Plan to increase Levemir as ordered by provider Plan to walk twice a week for 30 minutes.  Goal of 150 minutes a week Plan to keep eye appointment in January Plan to return to Link to Wellness on 1/31/17at 3PM  [facility] CM Care Plan Problem One        Most Recent Value   Care Plan Problem One  Elevated blood sugars related to dx of Type 2 DM as evidenced by hemoglobin A1C of 10.1   Role Documenting the Problem One  Care Management Coordinator   Care Plan for Problem One  Active   THN Long Term Goal (31-90 days)  Member will see decrease of hemoglobin A1C  to 8.5 within the next 90 days   THN Long Term Goal  Start Date  08/11/15 [Not meeting hemoglobin A1C goal last result 9.7 07/16/15]   Interventions for Problem One Long Term Goal  Reinforced on CHO counting and portion control, Encouraged to try eating fruit when she feels that she needs to eat sweets, Instructed on how Levemir insulin is different from her 70/30 insulin and to continue to increase the amount she is taking until her blood sugars are 130 or less in morning as ordered, Reinforced on importance of checking her blood sugars  1-2 times a day, Praised for making an appointment to have  annual dilated eye exams, Reinforced  on importance of exercise for  glycemic control,    Peter Garter RN, [facility] Care Management Coordinator-Link to [place] Management [phone]

## 2015-08-11 NOTE — Patient Instructions (Signed)
1. Plan to eat 30-45 GM (2-3) servings of carbohydrate a meal and 15 GM for snacks.   2. Plan to have protein with your snacks and avoid sweets 3. Plan to try using Calorie [place] to count calories and CHO 4. Plan to check blood sugar once a day fasting or 1 -2hrs after a meal.  Goals of 80-130 fasting and 180 or less -2 hours after eating.  Plan to increase Levemir as ordered by Zykia Walla 5. Plan to walk twice a week for 30 minutes.  Goal of 150 minutes a week 6. Plan to keep eye appointment in January 7. Plan to return to Link to Wellness on 1/31/17at 3PM

## 2015-08-19 MED FILL — NOVOLIN 70/30 100 UNITS/ML: (70-30) 100 | 20 days supply | Qty: 10 | Fill #0

## 2015-08-19 MED FILL — ATORVASTATIN 20 MG TABLET: 20 | 30 days supply | Qty: 30 | Fill #1

## 2015-08-19 MED FILL — TRUEPLUS SYR 0.5ML 30GX5/16: 30G X 5/16" | 50 days supply | Qty: 100 | Fill #3

## 2015-08-19 MED FILL — metFORMIN HCL 500 MG TABS: 500 | 30 days supply | Qty: 60 | Fill #1

## 2015-09-07 DIAGNOSIS — E0590 Thyrotoxicosis, unspecified without thyrotoxic crisis or storm: Secondary | ICD-10-CM | POA: Diagnosis not present

## 2015-09-11 ENCOUNTER — Ambulatory Visit
Admission: RE | Admit: 2015-09-11 | Discharge: 2015-09-11 | Disposition: A | Discharge status: Home / Self Care | Payer: 59 | Source: Ambulatory Visit | Attending: Physician Assistant | Admitting: Physician Assistant

## 2015-09-11 ENCOUNTER — Other Ambulatory Visit: Payer: Self-pay | Admitting: Physician Assistant

## 2015-09-11 DIAGNOSIS — M79671 Pain in right foot: Secondary | ICD-10-CM | POA: Diagnosis not present

## 2015-09-11 DIAGNOSIS — R52 Pain, unspecified: Secondary | ICD-10-CM

## 2015-09-12 IMAGING — US US PELVIS COMPLETE
1 series · 13 of 25 positions shown · non-contrast
Comparison: None

CLINICAL DATA: Menorrhagia, diabetes, morbid obesity

EXAM:
TRANSABDOMINAL AND TRANSVAGINAL ULTRASOUND OF PELVIS
TECHNIQUE: Both transabdominal and transvaginal ultrasound examinations of the
pelvis were performed. Transabdominal technique was performed for
global imaging of the pelvis including uterus, ovaries, adnexal
regions, and pelvic cul-de-sac. It was necessary to proceed with
endovaginal exam following the transabdominal exam to visualize the
uterus and adnexal structures.

[Series 1: us pelvis complete · 0.21mm/px · 13 of 83 slices shown]
[im 1/83]
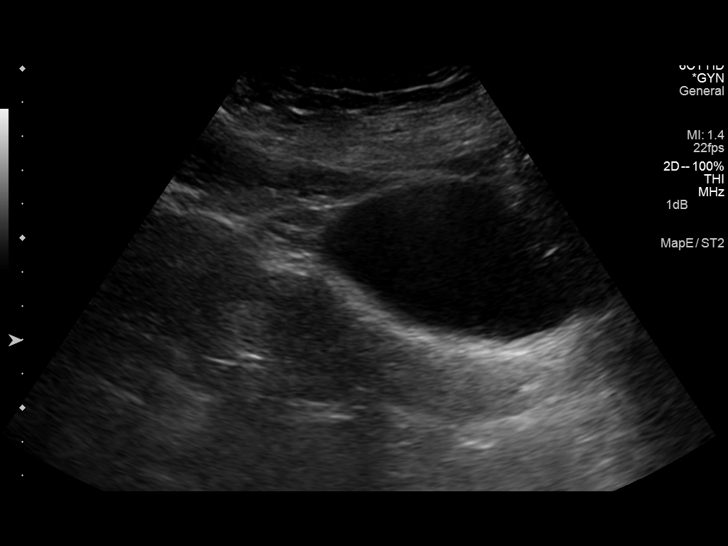
[im 7/83]
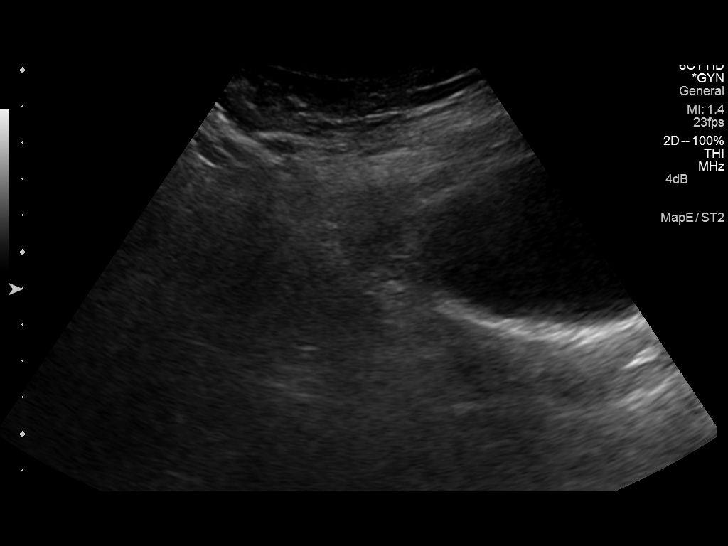
[im 14/83]
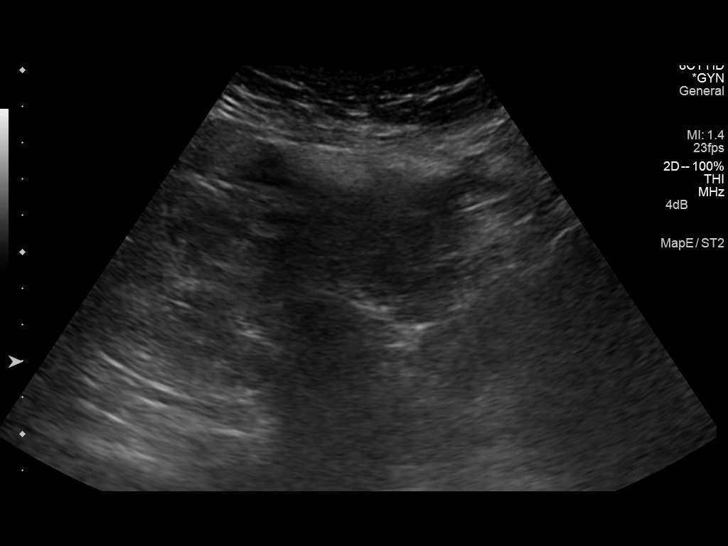
[im 21/83]
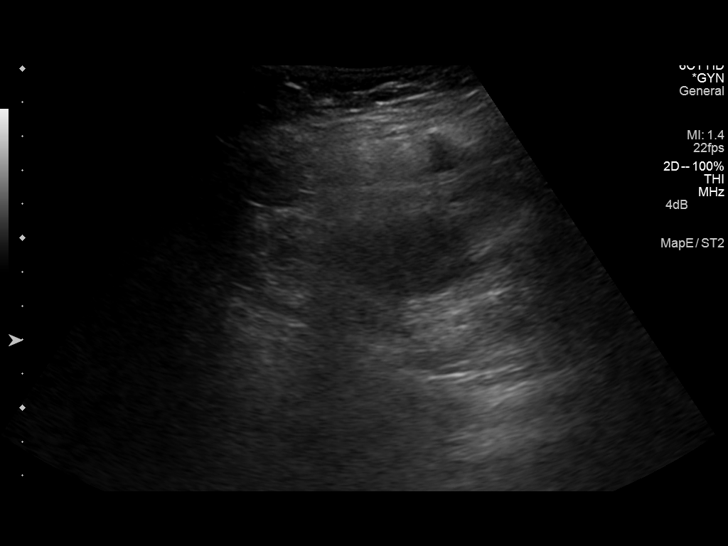
[im 28/83]
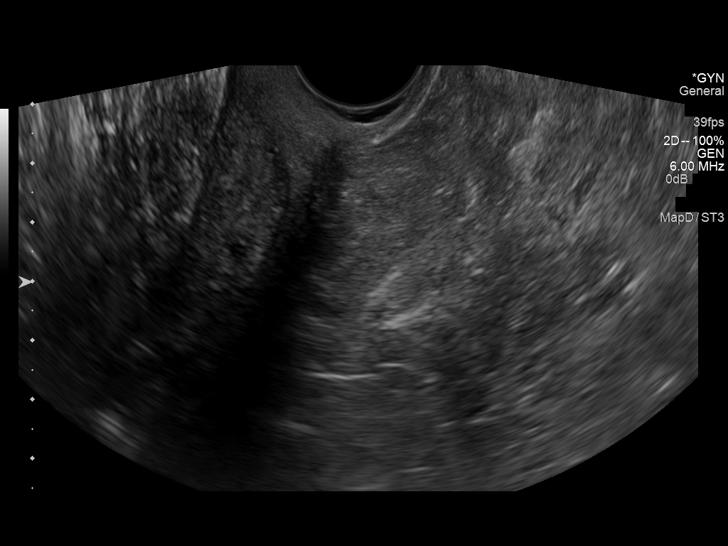
[im 35/83]
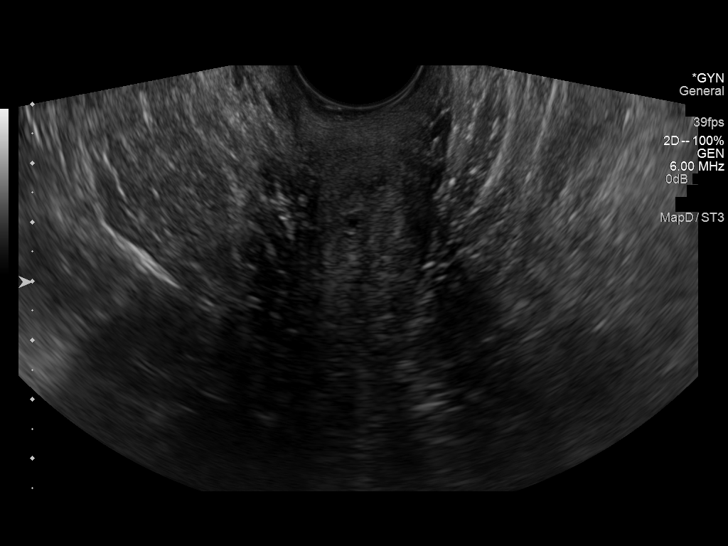
[im 42/83]
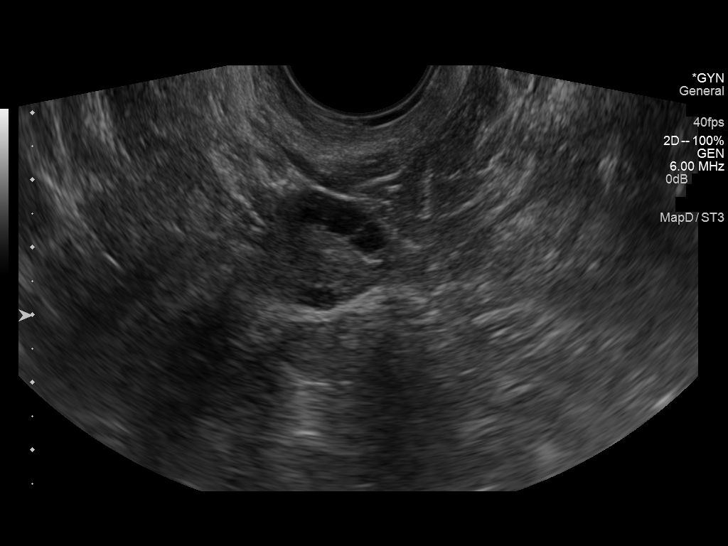
[im 48/83]
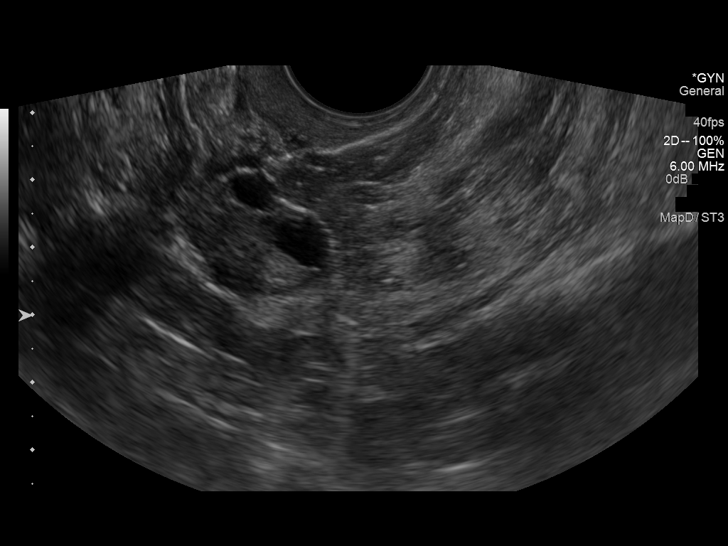
[im 55/83]
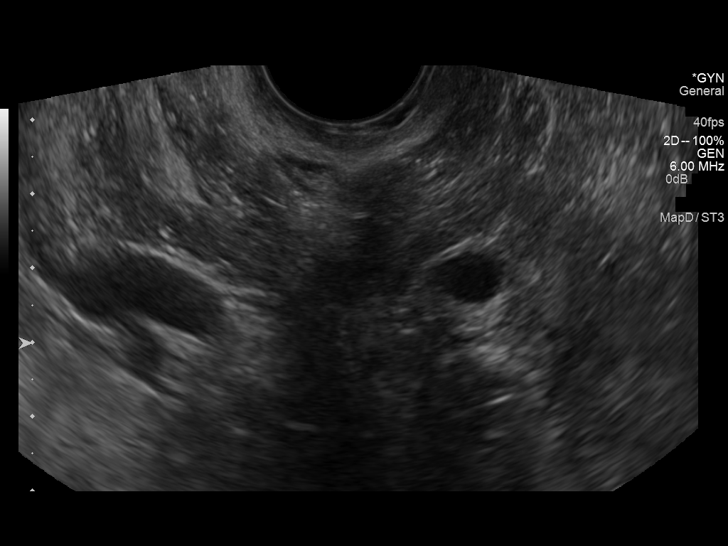
[im 62/83]
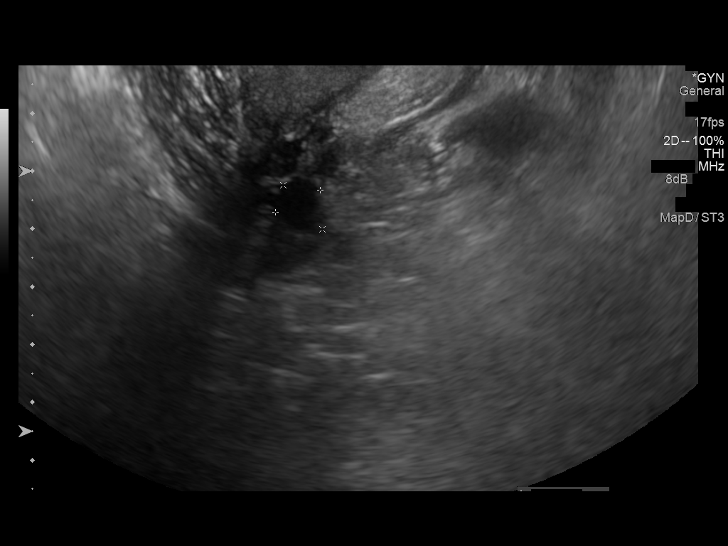
[im 69/83]
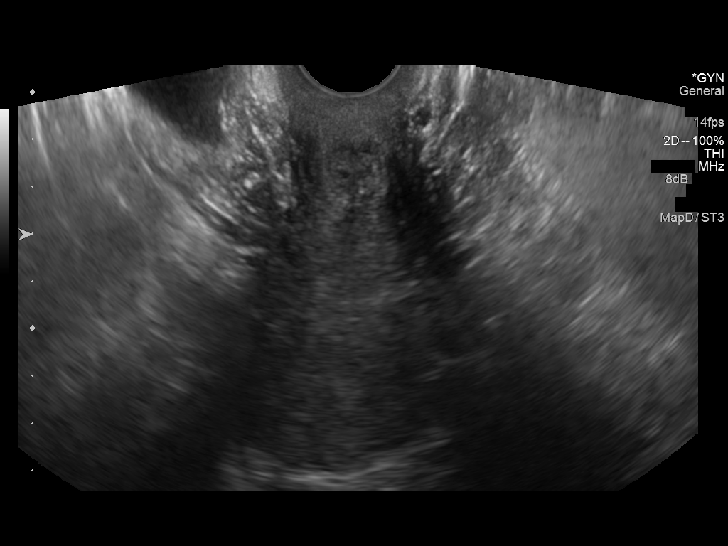
[im 76/83]
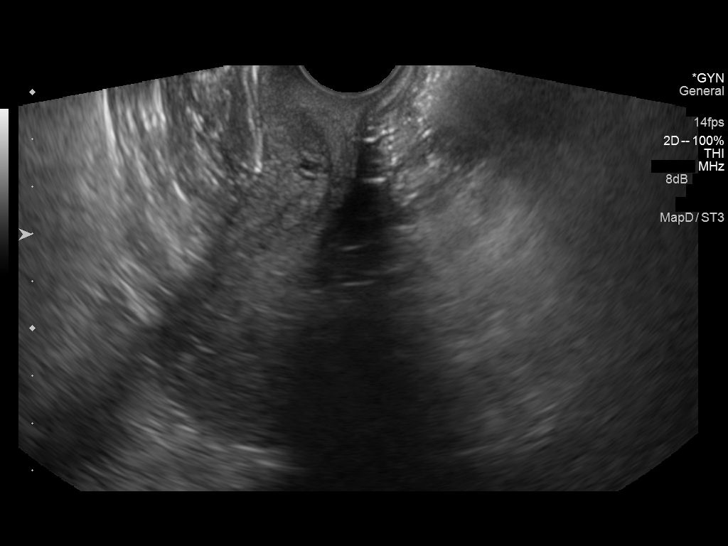
[im 83/83]
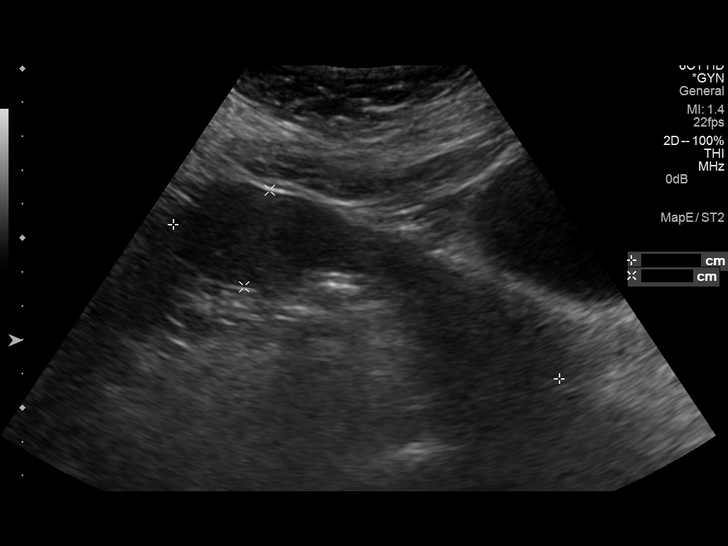

[13 of 25 positions shown; findings below may reference images not displayed]

FINDINGS: The evaluation of the uterus is quite limited due to the patient's
body habitus.

Uterus

Measurements: 12.3 x 3.0 x 4.0 cm. No fibroids or other mass
visualized.

Endometrium

Thickness: 3.4 mm.  No focal abnormality visualized.

Right ovary

Measurements: 2.5 x 1.6 x 2.1 cm. Normal appearance/no adnexal mass.

Left ovary

Measurements: 2.4 x 1.4 x 2.0 cm.. Normal appearance/no adnexal
mass.

Other findings

No free fluid.
IMPRESSION: 1. Evaluation of the uterus limited due to the patient's body
habitus. No definite fibroids are demonstrated. The visualized
portions of the endometrial stripe are normal.
2. The ovaries exhibit normal echotexture. There is no free pelvic
fluid.

## 2015-09-15 ENCOUNTER — Other Ambulatory Visit: Payer: Self-pay

## 2015-09-15 VITALS — BP 140/82 | HR 72 | Resp 16 | Ht 63.0 in | Wt 314.4 lb

## 2015-09-15 DIAGNOSIS — E119 Type 2 diabetes mellitus without complications: Secondary | ICD-10-CM

## 2015-09-15 DIAGNOSIS — Z794 Long term (current) use of insulin: Principal | ICD-10-CM

## 2015-09-15 NOTE — Patient Outreach (Signed)
[facility]) Care Management   09/15/2015  Rhonda Baldwin 12/26/1980 [id number]  Rhonda Baldwin is an 35 y.o. female.   Member seen for follow up office visit for Link to Wellness program for self management of Type 2 diabetes  Subjective:  Member states that she has been having nausea and sometimes vomiting after eating.  States that she is eating less but her weight continues to go up.  States she is tired all of the time.  States her blood sugars are a little lower and she is taking her insulins and Metformin as ordered.  States she does not think her Metformin is causing her GI upset.  States she is to see her provider on 3/1/7.  States she had to miss her scheduled eye exam and she has not rescheduled it yet.  Objective:   Review of Systems  Gastrointestinal: Positive for nausea.  Musculoskeletal: Positive for joint pain.    Physical Exam Today's Vitals   09/15/15 1508  BP: 140/82  Pulse: 72  Resp: 16  Height: 1.6 m ('5\' 3"' )  Weight: 314 lb 6.4 oz (142.611 kg)  SpO2: 100%  PainSc: 0-No pain   Current Medications:   Current Outpatient Prescriptions  Medication Sig Dispense Refill  . albuterol (PROVENTIL HFA;VENTOLIN HFA) 108 (90 BASE) MCG/ACT inhaler Inhale 1-2 puffs into the lungs every 6 (six) hours as needed for wheezing or shortness of breath. 3 Inhaler 3  . atenolol (TENORMIN) 50 MG tablet Take 1.5 tablets (75 mg total) by mouth daily. 90 tablet 3  . glucose monitoring kit (FREESTYLE) monitoring kit 1 each by Does not apply route as needed for other. 1 each 2  . insulin detemir (LEVEMIR) 100 UNIT/ML injection Inject 20 Units into the skin at bedtime.     . insulin NPH-regular Human (NOVOLIN 70/30) (70-30) 100 UNIT/ML injection Inject 20 Units into the skin 2 (two) times daily with a meal. (Patient taking differently: Inject 25 Units into the skin 2 (two) times daily with a meal. ) 3 vial 3  . lisinopril-hydrochlorothiazide (PRINZIDE,ZESTORETIC) 20-25 MG  per tablet Take 1 tablet by mouth daily. 90 tablet 3  . loratadine (CLARITIN) 10 MG tablet Take 1 tablet (10 mg total) by mouth daily as needed for allergies. 30 tablet 3  . metFORMIN (GLUCOPHAGE) 500 MG tablet Take 500 mg by mouth 2 (two) times daily with a meal.    . methimazole (TAPAZOLE) 10 MG tablet Take 2 tablets (20 mg total) by mouth 2 (two) times daily. 180 tablet 3  . naproxen sodium (ANAPROX) 220 MG tablet Take 440 mg by mouth 2 (two) times daily with a meal. Reported on 08/11/2015    . TRUEPLUS INSULIN SYRINGE 30G X 5/16" 0.5 ML MISC USE AS DIRECTED WITH INSULIN 100 each 5  . atorvastatin (LIPITOR) 20 MG tablet Take 20 mg by mouth daily.    Marland Kitchen HYDROcodone-acetaminophen (NORCO/VICODIN) 5-325 MG per tablet Take 1 tablet by mouth every 6 (six) hours as needed for moderate pain. (Patient not taking: Reported on 06/16/2015) 10 tablet 0  . ibuprofen (ADVIL,MOTRIN) 800 MG tablet Take 1 tablet (800 mg total) by mouth 3 (three) times daily. (Patient not taking: Reported on 09/15/2015) 21 tablet 0  . ipratropium (ATROVENT) 0.06 % nasal spray Place 2 sprays into both nostrils 4 (four) times daily. (Patient not taking: Reported on 09/15/2015) 15 mL 3  . Lancets (FREESTYLE) lancets Use as instructed (Patient not taking: Reported on 03/05/2015) 100 each 12  .  metFORMIN (GLUCOPHAGE) 1000 MG tablet Take 1 tablet (1,000 mg total) by mouth 2 (two) times daily with a meal. (Patient not taking: Reported on 08/11/2015) 180 tablet 3   No current facility-administered medications for this visit.    Functional Status:   In your present state of health, do you have any difficulty performing the following activities: 09/15/2015 08/11/2015  Hearing? N N  Vision? N N  Difficulty concentrating or making decisions? N N  Walking or climbing stairs? N N  Dressing or bathing? N N  Doing errands, shopping? N N  Preparing Food and eating ? - -  Using the Toilet? - -  In the past six months, have you accidently leaked  urine? - -  Do you have problems with loss of bowel control? - -  Managing your Medications? - -  Housekeeping or managing your Housekeeping? - -    Fall/Depression Screening:    PHQ 2/9 Scores 09/15/2015 08/11/2015 06/16/2015 03/05/2015 02/10/2015 12/23/2014 12/09/2014  PHQ - 2 Score 0 0 0 0 0 0 0    Assessment:    Member seen for follow up office visit for Link to Wellness program for self management of Type 2 diabetes.  Member not meeting goal of hemoglobin A1C of less than 7% with last result 9.7. Member now has new primary care provider and has follow up in March. Member now taking Levemir insulin and she has increased dosage to 20 units daily.  CGBs ranging 143-260.  Reports GI upset and decreased appetite but no weight loss.  Member not adhering to low CHO diet or exercise. Member missed annual eye exam scheduled in January. Plan:   Plan to eat 30-45 GM (2-3) servings of carbohydrate a meal and 15 GM for snacks.   Plan to have protein with your snacks Plan to keep food log and take to MD appointment [facility]  [phone] Plan to check blood sugar once or twice a day fasting or 1 -2hrs after a meal.  Goals of 80-130 fasting and 180 or less -2 hours after eating.   Plan to walk twice a week for 20 minutes.  Goal of 150 minutes a week Plan to reschedule eye appointment  Plan see provider on 3/1/7 Plan to return to Link to Wellness on 4/11/17at 3PM [facility] CM Care Plan Problem One        Most Recent Value   Care Plan Problem One  Elevated blood sugars related to dx of Type 2 DM as evidenced by hemoglobin A1C of 10.1   Role Documenting the Problem One  Care Management [place] for Problem One  Active   THN Long Term Goal (31-90 days)  Member will see decrease of hemoglobin A1C  to 8.5 within the next 90 days   THN Long Term Goal Start Date  09/15/15 [ hemoglobin A1C  last result 9.7 recheck on 10/14/15]    Interventions for Problem One Long Term Goal  Reinforced on CHO counting and portion control,Instructed to discuss with provider about her GI upset and not losing weight,Instructed to keep journal of  her food intake and to show to provider, Instructed she can see RD again to review food journal and weight loss ideas, Instructed on how elevated blood sugars can cause slowing of gastric motility, Reinforced to try eating fruit when she feels that she needs to eat sweets  Reinforced on importance of checking her blood sugars 1-2  times a day, Instructed to reschedule  annual dilated eye exams that she missed, Reinforced  on importance of exercise for  glycemic control,    Peter Garter RN, [facility] Care Management Coordinator-Link to [place] Management [phone]

## 2015-09-15 NOTE — Patient Instructions (Signed)
1. Plan to eat 30-45 GM (2-3) servings of carbohydrate a meal and 15 GM for snacks.   2. Plan to have protein with your snacks 3. Plan to keep food log and take to MD appointment 4. Consider seeing [facility].  [phone] 5. Plan to check blood sugar once or twice a day fasting or 1 -2hrs after a meal.  Goals of 80-130 fasting and 180 or less -2 hours after eating.   6. Plan to walk twice a week for 20 minutes.  Goal of 150 minutes a week 7. Plan to reschedule eye appointment  8. Plan see provider on 3/1/7 9. Plan to return to Link to Wellness on 4/11/17at 3PM

## 2015-09-18 MED FILL — ATORVASTATIN 20 MG TABLET: 20 | 60 days supply | Qty: 60 | Fill #2

## 2015-09-18 MED FILL — NOVOLIN 70/30 100 UNITS/ML: (70-30) 100 | 20 days supply | Qty: 10 | Fill #1

## 2015-10-06 MED FILL — metFORMIN HCL 500 MG TABS: 500 | 30 days supply | Qty: 60 | Fill #2

## 2015-10-06 MED FILL — TRUEPLUS SYR 0.5ML 30GX5/16: 30G X 5/16" | 50 days supply | Qty: 100 | Fill #4

## 2015-10-06 MED FILL — NOVOLIN 70/30 100 UNITS/ML: (70-30) 100 | 20 days supply | Qty: 10 | Fill #0

## 2015-10-06 MED FILL — methIMAzole 10 MG TABS: 10 | 30 days supply | Qty: 120 | Fill #1

## 2015-10-14 DIAGNOSIS — E119 Type 2 diabetes mellitus without complications: Secondary | ICD-10-CM | POA: Diagnosis not present

## 2015-10-14 DIAGNOSIS — E0590 Thyrotoxicosis, unspecified without thyrotoxic crisis or storm: Secondary | ICD-10-CM | POA: Diagnosis not present

## 2015-10-14 DIAGNOSIS — I10 Essential (primary) hypertension: Secondary | ICD-10-CM | POA: Diagnosis not present

## 2015-10-14 DIAGNOSIS — L309 Dermatitis, unspecified: Secondary | ICD-10-CM | POA: Diagnosis not present

## 2015-10-14 DIAGNOSIS — Z794 Long term (current) use of insulin: Secondary | ICD-10-CM | POA: Diagnosis not present

## 2015-10-14 DIAGNOSIS — Z7984 Long term (current) use of oral hypoglycemic drugs: Secondary | ICD-10-CM | POA: Diagnosis not present

## 2015-10-14 MED FILL — metFORMIN HCL 1000 MG TABS: 1000 | 30 days supply | Qty: 60 | Fill #0

## 2015-10-14 MED FILL — TRIAMCINOLONE 0.1% CREAM: 0.1 | 20 days supply | Qty: 60 | Fill #0

## 2015-10-22 MED FILL — BD PEN NDL MINI 31GX5MM: 31G X 5 MM | 90 days supply | Qty: 100 | Fill #1

## 2015-10-22 MED FILL — LEVEMIR FLEXTOUCH 100 UNITS: 100 | 30 days supply | Qty: 15 | Fill #1

## 2015-10-22 MED FILL — LISINOPRIL-HCTZ 20-25 MG TA: 20-25 | 30 days supply | Qty: 30 | Fill #1

## 2015-10-23 MED FILL — CONTOUR NEXT STRIPS: 30 days supply | Qty: 100 | Fill #2

## 2015-10-23 MED FILL — MICROLET LANCETS: 30 days supply | Qty: 100 | Fill #2

## 2015-11-05 ENCOUNTER — Emergency Department (HOSPITAL_BASED_OUTPATIENT_CLINIC_OR_DEPARTMENT_OTHER): Payer: 59

## 2015-11-05 ENCOUNTER — Emergency Department (HOSPITAL_BASED_OUTPATIENT_CLINIC_OR_DEPARTMENT_OTHER)
Admission: EM | Admit: 2015-11-05 | Discharge: 2015-11-05 | Disposition: A | Discharge status: Home / Self Care | Payer: 59 | Attending: Emergency Medicine | Admitting: Emergency Medicine

## 2015-11-05 ENCOUNTER — Encounter (HOSPITAL_BASED_OUTPATIENT_CLINIC_OR_DEPARTMENT_OTHER): Payer: Self-pay | Admitting: Emergency Medicine

## 2015-11-05 DIAGNOSIS — Z79899 Other long term (current) drug therapy: Secondary | ICD-10-CM | POA: Insufficient documentation

## 2015-11-05 DIAGNOSIS — Z791 Long term (current) use of non-steroidal anti-inflammatories (NSAID): Secondary | ICD-10-CM | POA: Diagnosis not present

## 2015-11-05 DIAGNOSIS — Z7984 Long term (current) use of oral hypoglycemic drugs: Secondary | ICD-10-CM | POA: Insufficient documentation

## 2015-11-05 DIAGNOSIS — J111 Influenza due to unidentified influenza virus with other respiratory manifestations: Secondary | ICD-10-CM | POA: Insufficient documentation

## 2015-11-05 DIAGNOSIS — R000 Tachycardia, unspecified: Secondary | ICD-10-CM | POA: Insufficient documentation

## 2015-11-05 DIAGNOSIS — R05 Cough: Secondary | ICD-10-CM | POA: Diagnosis not present

## 2015-11-05 DIAGNOSIS — M25531 Pain in right wrist: Secondary | ICD-10-CM | POA: Insufficient documentation

## 2015-11-05 DIAGNOSIS — E1165 Type 2 diabetes mellitus with hyperglycemia: Secondary | ICD-10-CM | POA: Diagnosis not present

## 2015-11-05 DIAGNOSIS — G8929 Other chronic pain: Secondary | ICD-10-CM | POA: Diagnosis not present

## 2015-11-05 DIAGNOSIS — I10 Essential (primary) hypertension: Secondary | ICD-10-CM | POA: Diagnosis not present

## 2015-11-05 DIAGNOSIS — Z794 Long term (current) use of insulin: Secondary | ICD-10-CM | POA: Diagnosis not present

## 2015-11-05 DIAGNOSIS — R69 Illness, unspecified: Secondary | ICD-10-CM

## 2015-11-05 DIAGNOSIS — B349 Viral infection, unspecified: Secondary | ICD-10-CM | POA: Diagnosis not present

## 2015-11-05 LAB — BASIC METABOLIC PANEL
Anion gap: 11 (ref 5–15)
BUN: 10 mg/dL (ref 6–20)
CO2: 26 mmol/L (ref 22–32)
Calcium: 9 mg/dL (ref 8.9–10.3)
Chloride: 100 mmol/L — ABNORMAL LOW (ref 101–111)
Creatinine, Ser: 0.79 mg/dL (ref 0.44–1.00)
GFR calc Af Amer: >60 mL/min (ref >60)
GFR calc non Af Amer: >60 mL/min (ref >60)
Glucose, Bld: 149 mg/dL — ABNORMAL HIGH (ref 65–99)
Potassium: 4.1 mmol/L (ref 3.5–5.1)
Sodium: 137 mmol/L (ref 135–145)

## 2015-11-05 LAB — CBG MONITORING, ED: Glucose-Capillary: 136 mg/dL — ABNORMAL HIGH (ref 65–99)

## 2015-11-05 MED ORDER — ALBUTEROL SULFATE HFA 108 (90 BASE) MCG/ACT IN AERS: 2.0000 | INHALATION_SPRAY | RESPIRATORY_TRACT | Status: DC | PRN

## 2015-11-05 MED ORDER — ALBUTEROL SULFATE (2.5 MG/3ML) 0.083% IN NEBU
5.0000 mg | INHALATION_SOLUTION | Freq: Once | RESPIRATORY_TRACT | Status: AC
  Administered 2015-11-05: 5 mg via RESPIRATORY_TRACT
  Filled 2015-11-05: qty 6

## 2015-11-05 MED ORDER — AEROCHAMBER PLUS W/MASK MISC: Status: DC

## 2015-11-05 MED ORDER — ACETAMINOPHEN 500 MG PO TABS
1000.0000 mg | ORAL_TABLET | Freq: Once | ORAL | Status: AC
  Administered 2015-11-05: 1000 mg via ORAL
  Filled 2015-11-05: qty 2

## 2015-11-05 MED FILL — VENTOLIN HFA 90 MCG INHALER: 108 (90 BAS | 17 days supply | Qty: 18 | Fill #0

## 2015-11-05 MED FILL — NOVOLIN 70/30 100 UNITS/ML: (70-30) 100 | 20 days supply | Qty: 10 | Fill #1

## 2015-11-05 MED FILL — ATENOLOL 50 MG TABLET: 50 | 30 days supply | Qty: 45 | Fill #1

## 2015-11-05 NOTE — ED Notes (Signed)
Patient asked to change into gown. 

## 2015-11-05 NOTE — ED Notes (Signed)
Body aches  For a few days  Cough since yesterday

## 2015-11-05 NOTE — ED Provider Notes (Signed)
CSN: [id number]     Arrival date & time 11/05/15  0704 History   First MD Initiated Contact with Patient 11/05/15 [phone]     No chief complaint on file.  Chief complaint cough  (Consider location/radiation/quality/duration/timing/severity/associated sxs/prior Treatment) HPI Complains of cough, diffuse body aches, sore throat, dyspnea headache and subjective fever onset yesterday. Treated with NyQuil, without relief. No other associated symptoms. Nothing makes symptoms better or worse. Last checked her blood sugar 2 days ago was 180. No nausea or vomiting. Past Medical History  Diagnosis Date  . Hypertension   . Diabetes mellitus   . Thyroid disease    Past Surgical History  Procedure Laterality Date  . Cholecystectomy    . Cesarean section     Family History  Problem Relation Age of Onset  . Hypertension Mother   . Hypertension Father    Social History  Substance Use Topics  . Smoking status: Never Smoker   . Smokeless tobacco: Never Used  . Alcohol Use: No   OB History    No data available     Review of Systems  Constitutional: Positive for fever.       Subjective fever  HENT: Positive for sore throat.   Respiratory: Positive for cough and shortness of breath.   Musculoskeletal: Positive for myalgias and arthralgias.       Chronic right wrist pain  Allergic/Immunologic: Positive for immunocompromised state.       Diabetic  Neurological: Positive for headaches.  All other systems reviewed and are negative.     Allergies  Orange  Home Medications   Prior to Admission medications   Medication Sig Start Date End Date Taking? Authorizing Provider  albuterol (PROVENTIL HFA;VENTOLIN HFA) 108 (90 BASE) MCG/ACT inhaler Inhale 1-2 puffs into the lungs every 6 (six) hours as needed for wheezing or shortness of breath. 12/15/14   Tresa Garter, MD  atenolol (TENORMIN) 50 MG tablet Take 1.5 tablets (75 mg total) by mouth daily. 12/15/14   Tresa Garter, MD   atorvastatin (LIPITOR) 20 MG tablet Take 20 mg by mouth daily.    Historical Provider, MD  glucose monitoring kit (FREESTYLE) monitoring kit 1 each by Does not apply route as needed for other. 08/23/13   Reyne Dumas, MD  HYDROcodone-acetaminophen (NORCO/VICODIN) 5-325 MG per tablet Take 1 tablet by mouth every 6 (six) hours as needed for moderate pain. Patient not taking: Reported on 06/16/2015 02/27/15   Evelina Bucy, MD  ibuprofen (ADVIL,MOTRIN) 800 MG tablet Take 1 tablet (800 mg total) by mouth 3 (three) times daily. Patient not taking: Reported on 09/15/2015 02/27/15   Evelina Bucy, MD  insulin detemir (LEVEMIR) 100 UNIT/ML injection Inject 20 Units into the skin at bedtime.     Historical Provider, MD  insulin NPH-regular Human (NOVOLIN 70/30) (70-30) 100 UNIT/ML injection Inject 20 Units into the skin 2 (two) times daily with a meal. Patient taking differently: Inject 25 Units into the skin 2 (two) times daily with a meal.  12/15/14   Olugbemiga E Doreene Burke, MD  ipratropium (ATROVENT) 0.06 % nasal spray Place 2 sprays into both nostrils 4 (four) times daily. Patient not taking: Reported on 09/15/2015 12/15/14   Tresa Garter, MD  Lancets (FREESTYLE) lancets Use as instructed Patient not taking: Reported on 03/05/2015 01/19/15   Tresa Garter, MD  lisinopril-hydrochlorothiazide (PRINZIDE,ZESTORETIC) 20-25 MG per tablet Take 1 tablet by mouth daily. 12/15/14   Tresa Garter, MD  loratadine (CLARITIN) 10 MG tablet Take 1  tablet (10 mg total) by mouth daily as needed for allergies. 12/15/14   Tresa Garter, MD  metFORMIN (GLUCOPHAGE) 1000 MG tablet Take 1 tablet (1,000 mg total) by mouth 2 (two) times daily with a meal. Patient not taking: Reported on 08/11/2015 12/15/14   Tresa Garter, MD  metFORMIN (GLUCOPHAGE) 500 MG tablet Take 500 mg by mouth 2 (two) times daily with a meal.    Historical Provider, MD  methimazole (TAPAZOLE) 10 MG tablet Take 2 tablets (20 mg total) by mouth 2  (two) times daily. 12/22/14   Tresa Garter, MD  naproxen sodium (ANAPROX) 220 MG tablet Take 440 mg by mouth 2 (two) times daily with a meal. Reported on 08/11/2015    Historical Provider, MD  TRUEPLUS INSULIN SYRINGE 30G X 5/16" 0.5 ML MISC USE AS DIRECTED WITH INSULIN 02/13/15   Tresa Garter, MD   BP 126/80 mmHg  Pulse 118  Temp(Src) 99.9 F (37.7 C) (Oral)  Resp 24  Ht '5\' 3"'  (1.6 m)  Wt 300 lb (136.079 kg)  BMI 53.16 kg/m2  SpO2 96% Physical Exam  Constitutional: She appears well-developed and well-nourished. No distress.  HENT:  Head: Normocephalic and atraumatic.  Eyes: Conjunctivae are normal. Pupils are equal, round, and reactive to light.  Neck: Neck supple. No tracheal deviation present. No thyromegaly present.  Cardiovascular: Regular rhythm.   No murmur heard. Mildly tachycardic  Pulmonary/Chest: Effort normal and breath sounds normal. No respiratory distress.  Coughing, scant diffuse rhonchi  Abdominal: Soft. Bowel sounds are normal. She exhibits no distension. There is no tenderness.  Morbidly obese  Musculoskeletal: Normal range of motion. She exhibits no edema or tenderness.  Neurological: She is alert. Coordination normal.  Skin: Skin is warm and dry. No rash noted.  Psychiatric: She has a normal mood and affect.  Nursing note and vitals reviewed.   ED Course  Procedures (including critical care time) Labs Review Labs Reviewed - No data to display  Imaging Review No results found. I have personally reviewed and evaluated these images and lab results as part of my medical decision-making.   EKG Interpretation None     8:05 AM feels improved after treatment with albuterol nebulizer and Tylenol.  Chest x-ray viewed by me Results for orders placed or performed during the hospital encounter of 58/09/98  Basic metabolic panel  Result Value Ref Range   Sodium 137 135 - 145 mmol/L   Potassium 4.1 3.5 - 5.1 mmol/L   Chloride 100 (L) 101 - 111  mmol/L   CO2 26 22 - 32 mmol/L   Glucose, Bld 149 (H) 65 - 99 mg/dL   BUN 10 6 - 20 mg/dL   Creatinine, Ser 0.79 0.44 - 1.00 mg/dL   Calcium 9.0 8.9 - 10.3 mg/dL   GFR calc non Af Amer >60 >60 mL/min   GFR calc Af Amer >60 >60 mL/min   Anion gap 11 5 - 15  CBG monitoring, ED  Result Value Ref Range   Glucose-Capillary 136 (H) 65 - 99 mg/dL   Dg Chest 2 View  11/05/2015  CLINICAL DATA:  Cough and congestion for 2 days EXAM: CHEST  2 VIEW COMPARISON:  07/02/2014 FINDINGS: Normal heart size. Lungs clear. No pneumothorax. No pleural effusion. IMPRESSION: No active cardiopulmonary disease. Electronically Signed   By: Marybelle Killings M.D.   On: 11/05/2015 07:36    MDM  Plan Tylenol. Encourage oral hydration. Prescription Albuterol HFA with spacer. See PMD if not better in  a week Final diagnoses:  None  Diagnosis #1 influenza-like illness #2 hyperglycemia     Orlie Dakin, MD 11/05/15 2419

## 2015-11-05 NOTE — Discharge Instructions (Signed)
Influenza, Adult Take Tylenol every 4 hours while awake for aches or for temperature higher than 100.4 while awake. Use your inhaler 2 puffs every 4 hours as needed for cough or shortness of breath. Make sure you drink at least six 8 ounce glasses of water each day to stay well hydrated. Your blood sugar was mildly elevated today 149. See your doctor if not improving in 3 or 4 days. Return if you feel worse for any reason. Influenza (flu) is an infection in the mouth, nose, and throat (respiratory tract) caused by a virus. The flu can make you feel very ill. Influenza spreads easily from person to person (contagious).  HOME CARE   Only take medicines as told by your doctor.  Use a cool mist humidifier to make breathing easier.  Get plenty of rest until your fever goes away. This usually takes 3 to 4 days.  Drink enough fluids to keep your pee (urine) clear or pale yellow.  Cover your mouth and nose when you cough or sneeze.  Wash your hands well to avoid spreading the flu.  Stay home from work or school until your fever has been gone for at least 1 full day.  Get a flu shot every year. GET HELP RIGHT AWAY IF:   You have trouble breathing or feel short of breath.  Your skin or nails turn blue.  You have severe neck pain or stiffness.  You have a severe headache, facial pain, or earache.  Your fever gets worse or keeps coming back.  You feel sick to your stomach (nauseous), throw up (vomit), or have watery poop (diarrhea).  You have chest pain.  You have a deep cough that gets worse, or you cough up more thick spit (mucus). MAKE SURE YOU:   Understand these instructions.  Will watch your condition.  Will get help right away if you are not doing well or get worse.   This information is not intended to replace advice given to you by your health care provider. Make sure you discuss any questions you have with your health care provider.   Document Released: 05/10/2008  Document Revised: 08/22/2014 Document Reviewed: 10/31/2011 Elsevier Interactive Patient Education [employer]2016 Elsevier Inc.

## 2015-11-18 ENCOUNTER — Encounter (HOSPITAL_COMMUNITY): Payer: Self-pay

## 2015-11-18 ENCOUNTER — Emergency Department (HOSPITAL_COMMUNITY)
Admission: EM | Admit: 2015-11-18 | Discharge: 2015-11-18 | Disposition: A | Discharge status: Home / Self Care | Payer: 59 | Attending: Emergency Medicine | Admitting: Emergency Medicine

## 2015-11-18 DIAGNOSIS — Z794 Long term (current) use of insulin: Secondary | ICD-10-CM | POA: Insufficient documentation

## 2015-11-18 DIAGNOSIS — I10 Essential (primary) hypertension: Secondary | ICD-10-CM | POA: Diagnosis not present

## 2015-11-18 DIAGNOSIS — R05 Cough: Secondary | ICD-10-CM | POA: Diagnosis present

## 2015-11-18 DIAGNOSIS — J40 Bronchitis, not specified as acute or chronic: Secondary | ICD-10-CM | POA: Insufficient documentation

## 2015-11-18 DIAGNOSIS — Z7984 Long term (current) use of oral hypoglycemic drugs: Secondary | ICD-10-CM | POA: Diagnosis not present

## 2015-11-18 DIAGNOSIS — E119 Type 2 diabetes mellitus without complications: Secondary | ICD-10-CM | POA: Diagnosis not present

## 2015-11-18 DIAGNOSIS — Z791 Long term (current) use of non-steroidal anti-inflammatories (NSAID): Secondary | ICD-10-CM | POA: Diagnosis not present

## 2015-11-18 DIAGNOSIS — Z79899 Other long term (current) drug therapy: Secondary | ICD-10-CM | POA: Insufficient documentation

## 2015-11-18 LAB — D-DIMER, QUANTITATIVE (NOT AT ARMC): D-Dimer, Quant: <0.27 ug/mL-FEU (ref 0.00–0.50)

## 2015-11-18 MED ORDER — PREDNISONE 10 MG PO TABS: 20.0000 mg | ORAL_TABLET | Freq: Two times a day (BID) | ORAL | Status: DC

## 2015-11-18 MED ORDER — PREDNISONE 20 MG PO TABS
60.0000 mg | ORAL_TABLET | Freq: Once | ORAL | Status: AC
  Administered 2015-11-18: 60 mg via ORAL
  Filled 2015-11-18: qty 3

## 2015-11-18 MED ORDER — IPRATROPIUM-ALBUTEROL 0.5-2.5 (3) MG/3ML IN SOLN
3.0000 mL | Freq: Once | RESPIRATORY_TRACT | Status: AC
  Administered 2015-11-18: 3 mL via RESPIRATORY_TRACT
  Filled 2015-11-18: qty 3

## 2015-11-18 MED ORDER — HYDROCOD POLST-CPM POLST ER 10-8 MG/5ML PO SUER: 5.0000 mL | Freq: Two times a day (BID) | ORAL | Status: DC | PRN

## 2015-11-18 NOTE — ED Notes (Signed)
Patient able to ambulate independently  

## 2015-11-18 NOTE — Discharge Instructions (Signed)
Continue to use your inhaler as needed. Follow up with your doctor or return here for worsening symptoms.

## 2015-11-18 NOTE — ED Notes (Signed)
Patient here with ongoing shortness of breath and coughing. [facility] and using inhaler with minimal relief

## 2015-11-18 NOTE — ED Provider Notes (Signed)
CSN: [id number]     Arrival date & time 11/18/15  1457 History  By signing my name below, I, Nicole Kindred, attest that this documentation has been prepared under the direction and in the presence of [place], NP  Electronically Signed: Nicole Kindred, ED Scribe 11/19/2015 at 3:41 PM.    Chief Complaint  Patient presents with  . Cough  . Shortness of Breath    Patient is a 35 y.o. female presenting with cough and shortness of breath. The history is provided by the patient. No language interpreter was used.  Cough Cough characteristics:  Unable to specify Severity:  Moderate Onset quality:  Gradual Duration:  2 days Timing:  Unable to specify Progression:  Unchanged Chronicity:  Recurrent (Pt had flu two weeks ago. ) Relieved by:  Nothing Worsened by:  Nothing tried Associated symptoms: shortness of breath   Associated symptoms: no chills, no fever and no sore throat   Shortness of Breath Associated symptoms: cough   Associated symptoms: no fever, no sore throat and no vomiting    HPI Comments: KARIYAH BAUGH is a 35 y.o. female with PMHx of HTN, DM, and thyroid disease who presents to the Emergency Department complaining of gradual onset, cough, ongoing for a few days. She reports associated shortness of breath and dyspnea upon exertion. Pt had the flu two weeks ago and was seen at [facility]. She received a negative CXR at that time. Her shortness of breath is worse now than it was when she had the flu. No other associated symptoms noted.She was given an inhaler at [facility] which has provided minimal relief to her symptoms today No other worsening or alleviating factors noted. Pt denies nausea, vomiting, diarrhea, fever, chills, sore throat, or any other pertinent symptoms.   Past Medical History  Diagnosis Date  . Hypertension   . Diabetes mellitus   . Thyroid disease    Past Surgical History  Procedure Laterality Date  . Cholecystectomy    . Cesarean section     Family  History  Problem Relation Age of Onset  . Hypertension Mother   . Hypertension Father    Social History  Substance Use Topics  . Smoking status: Never Smoker   . Smokeless tobacco: Never Used  . Alcohol Use: No   OB History    No data available     Review of Systems  Constitutional: Negative for fever and chills.  HENT: Negative for sore throat.   Respiratory: Positive for cough and shortness of breath.   Gastrointestinal: Negative for nausea, vomiting and diarrhea.    Allergies  Orange  Home Medications   Prior to Admission medications   Medication Sig Start Date End Date Taking? Authorizing Provider  albuterol (PROVENTIL HFA;VENTOLIN HFA) 108 (90 Base) MCG/ACT inhaler Inhale 2 puffs into the lungs every 2 (two) hours as needed for wheezing or shortness of breath (cough). 11/05/15   Orlie Dakin, MD  atenolol (TENORMIN) 50 MG tablet Take 1.5 tablets (75 mg total) by mouth daily. 12/15/14   Tresa Garter, MD  atorvastatin (LIPITOR) 20 MG tablet Take 20 mg by mouth daily.    Historical Provider, MD  chlorpheniramine-HYDROcodone (TUSSIONEX PENNKINETIC ER) 10-8 MG/5ML SUER Take 5 mLs by mouth every 12 (twelve) hours as needed for cough. 11/18/15   [place], NP  glucose monitoring kit (FREESTYLE) monitoring kit 1 each by Does not apply route as needed for other. 08/23/13   Reyne Dumas, MD  HYDROcodone-acetaminophen (NORCO/VICODIN) 5-325 MG  per tablet Take 1 tablet by mouth every 6 (six) hours as needed for moderate pain. Patient not taking: Reported on 06/16/2015 02/27/15   Evelina Bucy, MD  ibuprofen (ADVIL,MOTRIN) 800 MG tablet Take 1 tablet (800 mg total) by mouth 3 (three) times daily. Patient not taking: Reported on 09/15/2015 02/27/15   Evelina Bucy, MD  insulin detemir (LEVEMIR) 100 UNIT/ML injection Inject 20 Units into the skin at bedtime.     Historical Provider, MD  insulin NPH-regular Human (NOVOLIN 70/30) (70-30) 100 UNIT/ML injection Inject 20 Units into the skin 2  (two) times daily with a meal. Patient taking differently: Inject 25 Units into the skin 2 (two) times daily with a meal.  12/15/14   Olugbemiga E Doreene Burke, MD  ipratropium (ATROVENT) 0.06 % nasal spray Place 2 sprays into both nostrils 4 (four) times daily. Patient not taking: Reported on 09/15/2015 12/15/14   Tresa Garter, MD  Lancets (FREESTYLE) lancets Use as instructed Patient not taking: Reported on 03/05/2015 01/19/15   Tresa Garter, MD  lisinopril-hydrochlorothiazide (PRINZIDE,ZESTORETIC) 20-25 MG per tablet Take 1 tablet by mouth daily. 12/15/14   Tresa Garter, MD  loratadine (CLARITIN) 10 MG tablet Take 1 tablet (10 mg total) by mouth daily as needed for allergies. 12/15/14   Tresa Garter, MD  metFORMIN (GLUCOPHAGE) 1000 MG tablet Take 1 tablet (1,000 mg total) by mouth 2 (two) times daily with a meal. Patient not taking: Reported on 08/11/2015 12/15/14   Tresa Garter, MD  metFORMIN (GLUCOPHAGE) 500 MG tablet Take 500 mg by mouth 2 (two) times daily with a meal.    Historical Provider, MD  methimazole (TAPAZOLE) 10 MG tablet Take 2 tablets (20 mg total) by mouth 2 (two) times daily. 12/22/14   Tresa Garter, MD  naproxen sodium (ANAPROX) 220 MG tablet Take 440 mg by mouth 2 (two) times daily with a meal. Reported on 08/11/2015    Historical Provider, MD  predniSONE (DELTASONE) 10 MG tablet Take 2 tablets (20 mg total) by mouth 2 (two) times daily with a meal. 11/18/15   Geoffrey Hynes Bunnie Pion, NP  Spacer/Aero-Holding Chambers (AEROCHAMBER PLUS WITH MASK) inhaler Use as instructed 11/05/15   Orlie Dakin, MD  TRUEPLUS INSULIN SYRINGE 30G X 5/16" 0.5 ML MISC USE AS DIRECTED WITH INSULIN 02/13/15   Tresa Garter, MD   BP 161/97 mmHg  Pulse 91  Temp(Src) 98.6 F (37 C) (Oral)  Resp 20  Ht '5\' 3"'  (1.6 m)  Wt 311 lb (141.069 kg)  BMI 55.11 kg/m2  SpO2 99%  LMP 10/23/2015 Physical Exam  Constitutional: She is oriented to person, place, and time. She appears  well-developed and well-nourished.  HENT:  Head: Normocephalic.  Eyes: EOM are normal.  Neck: Normal range of motion.  Cardiovascular: Normal rate, regular rhythm and normal heart sounds.  Exam reveals no gallop.   No murmur heard. Pulmonary/Chest: Effort normal. No respiratory distress. She has wheezes. She has no rales.  Decreased breath sounds in lower lung fields. Occasional wheezing noted.   Abdominal: She exhibits no distension.  Musculoskeletal: Normal range of motion.  Neurological: She is alert and oriented to person, place, and time.  Psychiatric: She has a normal mood and affect.  Nursing note and vitals reviewed.   ED Course  Procedures (including critical care time) DIAGNOSTIC STUDIES: Oxygen Saturation is 99% on RA, normal by my interpretation.    COORDINATION OF CARE: 4:42 PM-Discussed treatment plan which includes EKG and D-dimer with pt at  bedside and pt agreed to plan.   Labs Review Results for orders placed or performed during the hospital encounter of 11/18/15 (from the past 24 hour(s))  D-dimer, quantitative (not at [facility])     Status: None   Collection Time: 11/18/15  5:47 PM  Result Value Ref Range   D-Dimer, Quant <0.27 0.00 - 0.50 ug/mL-FEU     Imaging   MDM  Pt evaluated after receiving breathing treatment and lungs were noted as clear to auscultation with normal effort and breath sounds. Dr. Stark Jock was consulted and examined patient. He agreed with current treatment plan.  35 y.o. female with shortness of breath that improved significantly after Duoneb. Stable for d/c without respiratory distress and O2 SAT 99% on R/A. She will use her inhaler as needed. Will add Tussionex and prednisone. Discussed with the patient and all questioned fully answered. She will return if any problems arise.  Final diagnoses:  Bronchitis   I personally performed the services described in this documentation, which was scribed in my presence. The recorded information has been  reviewed and is accurate.    [address] [place], [place] 11/19/15 [place], MD 11/19/15 [phone]

## 2015-11-18 NOTE — ED Notes (Signed)
Pt called multiple times by Ladona Ridgelaylor, NT  for room placement with no answer.

## 2015-11-19 MED FILL — predniSONE 10 MG TABS: 10 | 4 days supply | Qty: 16 | Fill #0

## 2015-11-19 MED FILL — HYDROCODONE-CHLORPHENIRAM S: 10-8 | 14 days supply | Qty: 140 | Fill #0

## 2015-11-24 ENCOUNTER — Other Ambulatory Visit: Payer: Self-pay

## 2015-11-25 NOTE — Patient Outreach (Signed)
[facility] Care Management  11/25/2015  Carleene CooperSharona Y Humphres 03/02/1981 [id number]   Member did not show for 11/24/15 scheduled Link to Wellness appointment.  Missed appointment letter sent. Dudley MajorMelissa Shonica Weier RN, [facility]M Care Management Coordinator-Link to Wellness [facility] Care Management [phone]

## 2015-12-03 MED FILL — ATORVASTATIN 20 MG TABLET: 20 | 90 days supply | Qty: 90 | Fill #0

## 2015-12-03 MED FILL — NOVOLIN 70/30 100 UNITS/ML: (70-30) 100 | 20 days supply | Qty: 10 | Fill #0

## 2015-12-03 MED FILL — LISINOPRIL-HCTZ 20-25 MG TA: 20-25 | 90 days supply | Qty: 90 | Fill #0

## 2015-12-31 MED FILL — methIMAzole 10 MG TABS: 10 | 30 days supply | Qty: 120 | Fill #2

## 2015-12-31 MED FILL — ATENOLOL 50 MG TABLET: 50 | 30 days supply | Qty: 45 | Fill #2

## 2015-12-31 MED FILL — LEVEMIR FLEXTOUCH 100 UNITS: 100 | 30 days supply | Qty: 15 | Fill #2

## 2015-12-31 MED FILL — NOVOLIN 70/30 100 UNITS/ML: (70-30) 100 | 20 days supply | Qty: 10 | Fill #1

## 2016-01-20 MED FILL — NOVOLIN 70/30 100 UNITS/ML: (70-30) 100 | 20 days supply | Qty: 10 | Fill #2

## 2016-01-28 ENCOUNTER — Other Ambulatory Visit: Payer: Self-pay

## 2016-01-28 VITALS — BP 134/88 | HR 72 | Resp 16 | Ht 63.0 in | Wt 317.2 lb

## 2016-01-28 DIAGNOSIS — Z794 Long term (current) use of insulin: Principal | ICD-10-CM

## 2016-01-28 DIAGNOSIS — E119 Type 2 diabetes mellitus without complications: Secondary | ICD-10-CM

## 2016-01-28 LAB — POCT GLYCOSYLATED HEMOGLOBIN (HGB A1C): Hemoglobin A1C: 8.9

## 2016-01-28 NOTE — Patient Outreach (Signed)
[facility]) Care Management   01/28/2016  Rhonda Baldwin 23-May-1981 [id number]  [name] is an 35 y.o. female  Member seen for follow up office visit for Link to Wellness program for self management of Type 2 diabetes  Subjective: Member states that she continues to work 2 jobs and she is also picking up extra shifts at [employer].  States she tries to eat right and she feels she is eating less since the weather is hot.  States that her weight continues to go up.  States she has not been checking her blood sugars for the last month or so.  States she is busy and just does not check.  States she is to see her provider again in September.  States she has not been exercising any other than working long hours.  States she and her  separated husband have decided to get divorced.  Objective:   Review of Systems  All other systems reviewed and are negative. POC HemoglobinA1C  8.9% POC CBG 93 3 1/2 hr post prandial  Physical Exam Today's Vitals   01/28/16 1503  BP: 134/88  Pulse: 72  Resp: 16  Height: 1.6 m ('5\' 3"'$ )  Weight: 317 lb 3.2 oz (143.881 kg)  SpO2: 99%  PainSc: 0-No pain   Encounter Medications:   Outpatient Encounter Prescriptions as of 01/28/2016  Medication Sig Note  . albuterol (PROVENTIL HFA;VENTOLIN HFA) 108 (90 Base) MCG/ACT inhaler Inhale 2 puffs into the lungs every 2 (two) hours as needed for wheezing or shortness of breath (cough).   Marland Kitchen atenolol (TENORMIN) 50 MG tablet Take 1.5 tablets (75 mg total) by mouth daily. 02/10/2015: Taking  . atorvastatin (LIPITOR) 20 MG tablet Take 20 mg by mouth daily.   Marland Kitchen glucose monitoring kit (FREESTYLE) monitoring kit 1 each by Does not apply route as needed for other.   . insulin detemir (LEVEMIR) 100 UNIT/ML injection Inject 25 Units into the skin at bedtime.    . insulin NPH-regular Human (NOVOLIN 70/30) (70-30) 100 UNIT/ML injection Inject 20 Units into the skin 2 (two) times daily with a meal. (Patient taking  differently: Inject 25 Units into the skin 2 (two) times daily with a meal. )   . Lancets (FREESTYLE) lancets Use as instructed   . lisinopril-hydrochlorothiazide (PRINZIDE,ZESTORETIC) 20-25 MG per tablet Take 1 tablet by mouth daily.   Marland Kitchen loratadine (CLARITIN) 10 MG tablet Take 1 tablet (10 mg total) by mouth daily as needed for allergies.   . metFORMIN (GLUCOPHAGE) 1000 MG tablet Take 1 tablet (1,000 mg total) by mouth 2 (two) times daily with a meal.   . methimazole (TAPAZOLE) 10 MG tablet Take 2 tablets (20 mg total) by mouth 2 (two) times daily.   . naproxen sodium (ANAPROX) 220 MG tablet Take 440 mg by mouth 2 (two) times daily with a meal. Reported on 08/11/2015   . TRUEPLUS INSULIN SYRINGE 30G X 5/16" 0.5 ML MISC USE AS DIRECTED WITH INSULIN   . chlorpheniramine-HYDROcodone (TUSSIONEX PENNKINETIC ER) 10-8 MG/5ML SUER Take 5 mLs by mouth every 12 (twelve) hours as needed for cough. (Patient not taking: Reported on 01/28/2016)   . HYDROcodone-acetaminophen (NORCO/VICODIN) 5-325 MG per tablet Take 1 tablet by mouth every 6 (six) hours as needed for moderate pain. (Patient not taking: Reported on 06/16/2015)   . ibuprofen (ADVIL,MOTRIN) 800 MG tablet Take 1 tablet (800 mg total) by mouth 3 (three) times daily. (Patient not taking: Reported on 09/15/2015)   . ipratropium (ATROVENT) 0.06 %  nasal spray Place 2 sprays into both nostrils 4 (four) times daily. (Patient not taking: Reported on 09/15/2015)   . metFORMIN (GLUCOPHAGE) 500 MG tablet Take 500 mg by mouth 2 (two) times daily with a meal. Reported on 01/28/2016   . predniSONE (DELTASONE) 10 MG tablet Take 2 tablets (20 mg total) by mouth 2 (two) times daily with a meal. (Patient not taking: Reported on 01/28/2016)   . Spacer/Aero-Holding Chambers (AEROCHAMBER PLUS WITH MASK) inhaler Use as instructed    No facility-administered encounter medications on file as of 01/28/2016.    Functional Status:   In your present state of health, do you have any  difficulty performing the following activities: 01/28/2016 09/15/2015  Hearing? N N  Vision? N N  Difficulty concentrating or making decisions? N N  Walking or climbing stairs? N N  Dressing or bathing? N N  Doing errands, shopping? N N    Fall/Depression Screening:    PHQ 2/9 Scores 01/28/2016 09/15/2015 08/11/2015 06/16/2015 03/05/2015 02/10/2015 12/23/2014  PHQ - 2 Score 0 0 0 0 0 0 0   Assessment: Member seen for follow up office visit for Link to Wellness program for self management of Type 2 diabetes. Member not meeting goal of hemoglobin A1C of less than 7% with  today's result 8.9%. Member saw provider in March Member not checking blood sugars.  Member' weight continues to increase and have reccommended visiting with RD again. Member reports stress with working 2 jobs and getting a divorce. Member not adhering to low CHO diet or exercise. Member completed annual eye exam.  Member due for dental exam. Member might benefit with a referral with an endocrinologist.  Plan:  Plan to eat 30-45 GM (2-3) servings of carbohydrate a meal and 15 GM for snacks.   Plan to have protein with your snacks Plan to [facility].  [phone] Plan to check blood sugar twice a week fasting or 1 -2hrs after a meal and when you feel bad.  Goals of 80-130 fasting and 180 or less -2 hours after eating.   Plan to go to the pool twice a week and exercise for 10-20 minutes.  Goal of 150 minutes a week Plan to schedule dental appointment  Plan see provider on 04/15/16    Plan to return to Link to Wellness on 05/05/16 at [place] Problem [facility]        Most Recent Value   Care Plan Problem One  Elevated blood sugars related to dx of Type 2 DM as evidenced by hemoglobin A1C of 10.1   Role Documenting the Problem One  Care Management [place] for Problem One  Active   [facility] Long Term Goal (31-90 days)  Member will see decrease  of hemoglobin A1C  to 8.5 within the next 90 days   [facility] Long Term Goal Start Date  01/28/16 [ Continue hemoglobin A1C  8.9% today]   Interventions for Problem One Long Term Goal  Reinforced on CHO counting and portion control, Reinforced that she can see RD again to review food journal and weight loss ideas,  Reinforced on importance of checking her blood sugars 1-2 times a day, Instructed to schedule dental exam,  Reinforced  on importance of exercise for  glycemic control, Encouraged to go to her pool to exercise in the water,    Peter Garter RN, [facility] Care Management Coordinator-Link to [place] Management (  [phone] 

## 2016-01-28 NOTE — Patient Instructions (Addendum)
1. Plan to eat 30-45 GM (2-3) servings of carbohydrate a meal and 15 GM for snacks.   2. Plan to have protein with your snacks 3. Plan to consider seeing [facility]itian [facility] at [facility].  [phone] 4. Plan to check blood sugar twice a week fasting or 1 -2hrs after a meal and when you feel bad.  Goals of 80-130 fasting and 180 or less -2 hours after eating.   5. Plan to go to the pool twice a week and exercise for 10-20 minutes.  Goal of 150 minutes a week 6. Plan to schedule dental appointment  7. Plan see provider on 04/15/16         Plan to return to Link to Wellness on 05/05/16 at [facility]

## 2016-02-08 MED FILL — NOVOLIN 70/30 100 UNITS/ML: (70-30) 100 | 20 days supply | Qty: 10 | Fill #3

## 2016-02-08 MED FILL — metFORMIN HCL 1000 MG TABS: 1000 | 30 days supply | Qty: 60 | Fill #1

## 2016-03-01 MED FILL — LEVEMIR FLEXTOUCH 100 UNITS: 100 | 30 days supply | Qty: 15 | Fill #3

## 2016-03-01 MED FILL — BD PEN NDL MINI 31GX5MM: 31G X 5 MM | 90 days supply | Qty: 100 | Fill #2 | Status: TO

## 2016-03-01 MED FILL — ATENOLOL 50 MG TABLET: 50 | 30 days supply | Qty: 45 | Fill #3

## 2016-03-01 MED FILL — NOVOLIN 70/30 100 UNITS/ML: (70-30) 100 | 20 days supply | Qty: 10 | Fill #4

## 2016-03-24 MED FILL — NOVOLIN 70/30 100 UNITS/ML: (70-30) 100 | 20 days supply | Qty: 10 | Fill #5

## 2016-03-31 MED FILL — TRUEPLUS SYR 0.5ML 30GX5/16: 30G X 5/16" | 50 days supply | Qty: 100 | Fill #0

## 2016-04-01 MED FILL — ATORVASTATIN 20 MG TABLET: 20 | 90 days supply | Qty: 90 | Fill #1 | Status: TO

## 2016-04-01 MED FILL — LISINOPRIL-HCTZ 20-25 MG TA: 20-25 | 90 days supply | Qty: 90 | Fill #1 | Status: TO

## 2016-04-15 DIAGNOSIS — I10 Essential (primary) hypertension: Secondary | ICD-10-CM | POA: Diagnosis not present

## 2016-04-15 DIAGNOSIS — Z0000 Encounter for general adult medical examination without abnormal findings: Secondary | ICD-10-CM | POA: Diagnosis not present

## 2016-04-15 DIAGNOSIS — Z23 Encounter for immunization: Secondary | ICD-10-CM | POA: Diagnosis not present

## 2016-04-15 DIAGNOSIS — E0590 Thyrotoxicosis, unspecified without thyrotoxic crisis or storm: Secondary | ICD-10-CM | POA: Diagnosis not present

## 2016-04-15 DIAGNOSIS — E119 Type 2 diabetes mellitus without complications: Secondary | ICD-10-CM | POA: Diagnosis not present

## 2016-04-15 DIAGNOSIS — Z794 Long term (current) use of insulin: Secondary | ICD-10-CM | POA: Diagnosis not present

## 2016-04-20 DIAGNOSIS — R8290 Unspecified abnormal findings in urine: Secondary | ICD-10-CM | POA: Diagnosis not present

## 2016-04-20 DIAGNOSIS — M79671 Pain in right foot: Secondary | ICD-10-CM | POA: Diagnosis not present

## 2016-04-20 MED FILL — CIPROFLOXACIN HCL 500 MG TA: 500 | 3 days supply | Qty: 6 | Fill #0

## 2016-05-05 ENCOUNTER — Other Ambulatory Visit: Payer: Self-pay

## 2016-05-06 NOTE — Patient Outreach (Signed)
[facility]) Care Management  05/06/2016  Rhonda CooperSharona Y Baldwin 12/07/1980 [id number]   Member did not show for scheduled Link to Wellness appointment 05/05/16.  Missed appointment letter sent. Dudley MajorMelissa Laportia Carley RN, [facility] Care Management Coordinator-[facility] [phone]

## 2016-05-11 MED FILL — CONTOUR NEXT STRIPS: 50 days supply | Qty: 50 | Fill #0

## 2016-05-11 MED FILL — methIMAzole 10 MG TABS: 10 | 30 days supply | Qty: 120 | Fill #3

## 2016-05-11 MED FILL — metFORMIN HCL 1000 MG TABS: 1000 | 30 days supply | Qty: 60 | Fill #2

## 2016-05-11 MED FILL — MICROLET LANCETS: 90 days supply | Qty: 100 | Fill #0

## 2016-05-12 MED FILL — ATENOLOL 50 MG TABLET: 50 | 30 days supply | Qty: 45 | Fill #0 | Status: TO

## 2016-05-12 MED FILL — LEVEMIR FLEXTOUCH 100 UNITS: 100 | 30 days supply | Qty: 15 | Fill #0 | Status: TO

## 2016-05-12 MED FILL — NOVOLIN 70/30 100 UNITS/ML: (70-30) 100 | 20 days supply | Qty: 10 | Fill #0 | Status: TO

## 2016-06-03 MED FILL — NOVOLIN 70/30 100 UNITS/ML: (70-30) 100 | 20 days supply | Qty: 10 | Fill #0

## 2016-06-16 ENCOUNTER — Other Ambulatory Visit: Payer: Self-pay

## 2016-06-16 VITALS — BP 120/80 | HR 66 | Resp 14 | Ht 63.0 in | Wt 330.6 lb

## 2016-06-16 DIAGNOSIS — E119 Type 2 diabetes mellitus without complications: Secondary | ICD-10-CM

## 2016-06-16 DIAGNOSIS — Z794 Long term (current) use of insulin: Principal | ICD-10-CM

## 2016-06-16 NOTE — Patient Outreach (Signed)
[facility]) Care Management   06/16/2016  Rhonda Baldwin March 15, 1981 [id number]  Rhonda Baldwin is an 35 y.o. female.   Member seen for follow up office visit for Link to Wellness program for self management of Type 2 diabetes  Subjective: Member states that she saw her provider in September.  States her hemoglobin A1C was 8.6%.  States she feels she does not eat a lot of food but her weight keeps going up.  States she does eat sweets at night.  States that she is working 2 jobs and does not get enough sleep.  States she is trying to check her blood sugars twice a day.  States that her morning range 110-130 and afternoon from 80-160.  States she has not been exercising.  States that her divorce is now finial.    Objective:   Review of Systems  Musculoskeletal: Positive for joint pain.    Physical Exam Today's Vitals   06/16/16 1505 06/16/16 1512  BP: 120/80   Pulse: 66   Resp: 14   SpO2: 99%   Weight: (!) 330 lb 9.6 oz (150 kg)   Height: 1.6 m (_0 )   PainSc: 0-No pain 0-No pain   Encounter Medications:   Outpatient Encounter Prescriptions as of 06/16/2016  Medication Sig Note  . albuterol (PROVENTIL HFA;VENTOLIN HFA) 108 (90 Base) MCG/ACT inhaler Inhale 2 puffs into the lungs every 2 (two) hours as needed for wheezing or shortness of breath (cough).   Marland Kitchen atenolol (TENORMIN) 50 MG tablet Take 1.5 tablets (75 mg total) by mouth daily. 02/10/2015: Taking  . atorvastatin (LIPITOR) 20 MG tablet Take 20 mg by mouth daily.   Marland Kitchen glucose monitoring kit (FREESTYLE) monitoring kit 1 each by Does not apply route as needed for other.   . insulin detemir (LEVEMIR) 100 UNIT/ML injection Inject 25 Units into the skin at bedtime.    . insulin NPH-regular Human (NOVOLIN 70/30) (70-30) 100 UNIT/ML injection Inject 20 Units into the skin 2 (two) times daily with a meal. (Patient taking differently: Inject 25 Units into the skin 2 (two) times daily with a meal. )   . Lancets  (FREESTYLE) lancets Use as instructed   . lisinopril-hydrochlorothiazide (PRINZIDE,ZESTORETIC) 20-25 MG per tablet Take 1 tablet by mouth daily.   Marland Kitchen loratadine (CLARITIN) 10 MG tablet Take 1 tablet (10 mg total) by mouth daily as needed for allergies.   . metFORMIN (GLUCOPHAGE) 1000 MG tablet Take 1 tablet (1,000 mg total) by mouth 2 (two) times daily with a meal. (Patient taking differently: Take 1,000 mg by mouth daily with breakfast. )   . methimazole (TAPAZOLE) 10 MG tablet Take 2 tablets (20 mg total) by mouth 2 (two) times daily.   . naproxen sodium (ANAPROX) 220 MG tablet Take 440 mg by mouth 2 (two) times daily with a meal. Reported on 08/11/2015   . Spacer/Aero-Holding Chambers (AEROCHAMBER PLUS WITH MASK) inhaler Use as instructed   . TRUEPLUS INSULIN SYRINGE 30G X 5/16" 0.5 ML MISC USE AS DIRECTED WITH INSULIN   . chlorpheniramine-HYDROcodone (TUSSIONEX PENNKINETIC ER) 10-8 MG/5ML SUER Take 5 mLs by mouth every 12 (twelve) hours as needed for cough. (Patient not taking: Reported on 06/16/2016)   . HYDROcodone-acetaminophen (NORCO/VICODIN) 5-325 MG per tablet Take 1 tablet by mouth every 6 (six) hours as needed for moderate pain. (Patient not taking: Reported on 06/16/2016)   . ibuprofen (ADVIL,MOTRIN) 800 MG tablet Take 1 tablet (800 mg total) by mouth 3 (three) times daily. (  Patient not taking: Reported on 06/16/2016)   . ipratropium (ATROVENT) 0.06 % nasal spray Place 2 sprays into both nostrils 4 (four) times daily. (Patient not taking: Reported on 06/16/2016)   . metFORMIN (GLUCOPHAGE) 500 MG tablet Take 500 mg by mouth 2 (two) times daily with a meal. Reported on 01/28/2016   . predniSONE (DELTASONE) 10 MG tablet Take 2 tablets (20 mg total) by mouth 2 (two) times daily with a meal. (Patient not taking: Reported on 06/16/2016)    No facility-administered encounter medications on file as of 06/16/2016.     Functional Status:   In your present state of health, do you have any difficulty  performing the following activities: 06/16/2016 01/28/2016  Hearing? N N  Vision? N N  Difficulty concentrating or making decisions? N N  Walking or climbing stairs? N N  Dressing or bathing? N N  Doing errands, shopping? N N  Some recent data might be hidden    Fall/Depression Screening:    PHQ 2/9 Scores 06/16/2016 01/28/2016 09/15/2015 08/11/2015 06/16/2015 03/05/2015 02/10/2015  PHQ - 2 Score 0 0 0 0 0 0 0    Assessment:  Member seen for follow up office visit for Link to Wellness program for self management of Type 2 diabetes. Member not meeting goal of hemoglobin A1C of less than 7% with last result 8.6%. Member saw provider in  September. Member  checking blood sugars 1-2 times a day.  Member' weight continues to increase but has not seen  RD again. Member reports stress with working 2 jobs and getting a divorce. Member not adhering to low CHO diet or exercise. Member completed annual eye exam.  Member due for dental exam. Member might benefit with a referral with an endocrinologist  Plan:   Plan to eat 30-45 GM (2-3) servings of carbohydrate a meal and 15 GM for snacks.   Plan to have protein with your snacks Plan to check blood sugar twice a day before breakfast and dinner or 1 -2hrs after a meal and when you feel bad.  Goals of 80-130 fasting and 180 or less -2 hours after eating.   Plan to walk twice a week for 20-25 minutes.  Goal of 150 minutes a week Plan to schedule dental appointment  Plan see provider in March Plan to enroll in the [facility] program Plan to be followed by the [employer] program in 2018  [facility] CM Care Plan Problem One   Flowsheet Row Most Recent Value  Care Plan Problem One  Elevated blood sugars related to dx of Type 2 DM as evidenced by hemoglobin A1C of 10.1  Role Documenting the Problem One  Care Management [place] for Problem One  Active  [facility] Long Term Goal (31-90 days)  Member will see decrease of hemoglobin A1C  to 8.5 within the  next 90 days  [facility] Long Term Goal Start Date  06/16/16 [ Continue hemoglobin A1C  8.6% ]  Interventions for Problem One Long Term Goal  Reinforced on CHO counting and portion control, Reinforced on importance of checking her blood sugars before she eat breakfast and dinner, Reinforced to schedule dental exam,  Reinforced  on importance of exercise for  glycemic control, Reinforced s/s of hypoglycemia and how to treat, Instructed to be sure to eat when she takes her insulin, Given handout on [employer] program and assisted with enrolling in the program    Peter Garter RN, [facility] Care Management Coordinator-Link to [place] Management [phone]

## 2016-06-17 NOTE — Patient Instructions (Signed)
1. Plan to eat 30-45 GM (2-3) servings of carbohydrate a meal and 15 GM for snacks.   2. Plan to have protein with your snacks 3. Plan to check blood sugar twice a day before breakfast and dinner or 1 -2hrs after a meal and when you feel bad.  Goals of 80-130 fasting and 180 or less -2 hours after eating.   4. Plan to walk twice a week for 20-25 minutes.  Goal of 150 minutes a week 5. Plan to schedule dental appointment  6. Plan see provider in March 7. [facility] 8. [facility]

## 2016-06-21 ENCOUNTER — Emergency Department (HOSPITAL_BASED_OUTPATIENT_CLINIC_OR_DEPARTMENT_OTHER)
Admission: EM | Admit: 2016-06-21 | Discharge: 2016-06-21 | Disposition: A | Discharge status: Home / Self Care | Payer: 59 | Attending: Emergency Medicine | Admitting: Emergency Medicine

## 2016-06-21 ENCOUNTER — Encounter (HOSPITAL_BASED_OUTPATIENT_CLINIC_OR_DEPARTMENT_OTHER): Payer: Self-pay | Admitting: *Deleted

## 2016-06-21 DIAGNOSIS — Z79899 Other long term (current) drug therapy: Secondary | ICD-10-CM | POA: Diagnosis not present

## 2016-06-21 DIAGNOSIS — Z794 Long term (current) use of insulin: Secondary | ICD-10-CM | POA: Insufficient documentation

## 2016-06-21 DIAGNOSIS — J209 Acute bronchitis, unspecified: Secondary | ICD-10-CM | POA: Insufficient documentation

## 2016-06-21 DIAGNOSIS — I10 Essential (primary) hypertension: Secondary | ICD-10-CM | POA: Insufficient documentation

## 2016-06-21 DIAGNOSIS — J029 Acute pharyngitis, unspecified: Secondary | ICD-10-CM | POA: Diagnosis present

## 2016-06-21 DIAGNOSIS — E119 Type 2 diabetes mellitus without complications: Secondary | ICD-10-CM | POA: Insufficient documentation

## 2016-06-21 DIAGNOSIS — Z791 Long term (current) use of non-steroidal anti-inflammatories (NSAID): Secondary | ICD-10-CM | POA: Diagnosis not present

## 2016-06-21 LAB — CBG MONITORING, ED: Glucose-Capillary: 196 mg/dL — ABNORMAL HIGH (ref 65–99)

## 2016-06-21 MED ORDER — AZITHROMYCIN 250 MG PO TABS: 250.0000 mg | ORAL_TABLET | Freq: Every day | ORAL | 0 refills | Status: DC

## 2016-06-21 MED ORDER — ALBUTEROL SULFATE HFA 108 (90 BASE) MCG/ACT IN AERS
2.0000 | INHALATION_SPRAY | Freq: Once | RESPIRATORY_TRACT | Status: AC
  Administered 2016-06-21: 2 via RESPIRATORY_TRACT
  Filled 2016-06-21: qty 6.7

## 2016-06-21 MED ORDER — AEROCHAMBER PLUS FLO-VU MEDIUM MISC
1.0000 | Freq: Once | Status: AC
  Administered 2016-06-21: 1
  Filled 2016-06-21: qty 1

## 2016-06-21 MED ORDER — BENZONATATE 100 MG PO CAPS: 100.0000 mg | ORAL_CAPSULE | Freq: Three times a day (TID) | ORAL | 0 refills | Status: DC

## 2016-06-21 MED FILL — BENZONATATE 100 MG CAPSULE: 100 | 7 days supply | Qty: 21 | Fill #0

## 2016-06-21 MED FILL — AZITHROMYCIN 250 MG TABLET: 250 | 5 days supply | Qty: 6 | Fill #0

## 2016-06-21 NOTE — ED Triage Notes (Signed)
C/o feeling bad x 1 week with laryngitis. C/o cough with yellow sputum. Fever with sorethroat.

## 2016-06-21 NOTE — ED Provider Notes (Signed)
[facility]PT [facility] Provider Note   CSN: [id number] Arrival date & time: 06/21/16  0710     History   Chief Complaint Chief Complaint  Patient presents with  . Sore Throat    HPI Rhonda Baldwin is a 35 y.o. female.  The history is provided by the patient.  Cough  This is a new problem. The current episode started 2 days ago. The problem occurs constantly. The problem has not changed since onset.The cough is productive of sputum. There has been no fever. Associated symptoms include chills, sweats and shortness of breath. Treatments tried: nyquil. The treatment provided mild relief. Risk factors: coworkers have had URIs. She is not a smoker. Her past medical history is significant for pneumonia. Her past medical history does not include emphysema or asthma.    Past Medical History:  Diagnosis Date  . Diabetes mellitus   . Hypertension   . Thyroid disease     Patient Active Problem List   Diagnosis Date Noted  . Menorrhagia, premenopausal 12/15/2014  . Need for prophylactic vaccination and inoculation against influenza 04/17/2014  . Essential hypertension 04/17/2014  . Diabetes ([place]) 11/28/2013  . Obesity, unspecified 05/03/2013  . Primary hyperthyroidism 08/19/2012  . Pneumonia 08/17/2012  . Nausea vomiting and diarrhea 08/17/2012  . Gastritis 08/17/2012  . HTN (hypertension) 08/17/2012  . DM (diabetes mellitus) ([place]) 08/17/2012    Past Surgical History:  Procedure Laterality Date  . CESAREAN SECTION    . CHOLECYSTECTOMY      OB History    No data available       Home Medications    Prior to Admission medications   Medication Sig Start Date End Date Taking? Authorizing Provider  atenolol (TENORMIN) 50 MG tablet Take 1.5 tablets (75 mg total) by mouth daily. 12/15/14  Yes Tresa Garter, MD  atorvastatin (LIPITOR) 20 MG tablet Take 20 mg by mouth daily.   Yes Historical Provider, MD  ibuprofen (ADVIL,MOTRIN) 800 MG tablet Take 1 tablet (800 mg  total) by mouth 3 (three) times daily. 02/27/15  Yes Evelina Bucy, MD  insulin detemir (LEVEMIR) 100 UNIT/ML injection Inject 25 Units into the skin at bedtime.    Yes Historical Provider, MD  insulin NPH-regular Human (NOVOLIN 70/30) (70-30) 100 UNIT/ML injection Inject 20 Units into the skin 2 (two) times daily with a meal. Patient taking differently: Inject 25 Units into the skin 2 (two) times daily with a meal.  12/15/14  Yes Tresa Garter, MD  Lancets (FREESTYLE) lancets Use as instructed 01/19/15  Yes Tresa Garter, MD  lisinopril-hydrochlorothiazide (PRINZIDE,ZESTORETIC) 20-25 MG per tablet Take 1 tablet by mouth daily. 12/15/14  Yes Tresa Garter, MD  loratadine (CLARITIN) 10 MG tablet Take 1 tablet (10 mg total) by mouth daily as needed for allergies. 12/15/14  Yes Tresa Garter, MD  metFORMIN (GLUCOPHAGE) 1000 MG tablet Take 1 tablet (1,000 mg total) by mouth 2 (two) times daily with a meal. Patient taking differently: Take 1,000 mg by mouth daily with breakfast.  12/15/14  Yes Tresa Garter, MD  metFORMIN (GLUCOPHAGE) 500 MG tablet Take 500 mg by mouth 2 (two) times daily with a meal. Reported on 01/28/2016   Yes Historical Provider, MD  methimazole (TAPAZOLE) 10 MG tablet Take 2 tablets (20 mg total) by mouth 2 (two) times daily. 12/22/14  Yes Tresa Garter, MD  naproxen sodium (ANAPROX) 220 MG tablet Take 440 mg by mouth 2 (two) times daily with a meal. Reported on  08/11/2015   Yes Historical Provider, MD  TRUEPLUS INSULIN SYRINGE 30G X 5/16" 0.5 ML MISC USE AS DIRECTED WITH INSULIN 02/13/15  Yes Olugbemiga E Doreene Burke, MD  glucose monitoring kit (FREESTYLE) monitoring kit 1 each by Does not apply route as needed for other. 08/23/13   Reyne Dumas, MD    Family History Family History  Problem Relation Age of Onset  . Hypertension Mother   . Hypertension Father     Social History Social History  Substance Use Topics  . Smoking status: Never Smoker  . Smokeless  tobacco: Never Used  . Alcohol use No     Allergies   Orange   Review of Systems Review of Systems  Constitutional: Positive for chills.  Respiratory: Positive for cough and shortness of breath.   All other systems reviewed and are negative.    Physical Exam Updated Vital Signs BP (!) 156/117 (BP Location: Right Arm)   Pulse 97   Temp 98.3 F (36.8 C) (Oral)   Resp 16   Ht '5\' 3"'$  (1.6 m)   Wt (!) 330 lb (149.7 kg)   LMP 06/15/2016   SpO2 95%   BMI 58.46 kg/m   Physical Exam  Constitutional: She is oriented to person, place, and time. She appears well-developed and well-nourished. No distress.  HENT:  Head: Normocephalic and atraumatic.  Nose: Nose normal.  Mouth/Throat: Oropharynx is clear and moist. No oropharyngeal exudate.  Eyes: Conjunctivae are normal.  Neck: Neck supple. No tracheal deviation present.  Cardiovascular: Normal rate, regular rhythm and normal heart sounds.   Pulmonary/Chest: Effort normal. No respiratory distress. She has wheezes (faint end-expiratory).  Abdominal: Soft. She exhibits no distension.  Neurological: She is alert and oriented to person, place, and time.  Skin: Skin is warm and dry.  Psychiatric: She has a normal mood and affect.  Vitals reviewed.    ED Treatments / Results  Labs (all labs ordered are listed, but only abnormal results are displayed) Labs Reviewed - No data to display  EKG  EKG Interpretation None       Radiology No results found.  Procedures Procedures (including critical care time)  Medications Ordered in ED Medications - No data to display   Initial Impression / Assessment and Plan / ED Course  I have reviewed the triage vital signs and the nursing notes.  Pertinent labs & imaging results that were available during my care of the patient were reviewed by me and considered in my medical decision making (see chart for details).  Clinical Course     35 year old female presents with cough and  chills over the last 2 days. She has had sick contacts at work. Currently afebrile, well-appearing with thick yellow productive sputum. Has faint end expiratory wheezing with no increased work of breathing or focal auscultation abnormality. Due to current clinical appearance I do not feel that chest x-ray is necessary. She will be treated empirically for bronchitis exacerbation with albuterol inhaler, Tessalon and azithromycin to cover atypical infection.  Final Clinical Impressions(s) / ED Diagnoses   Final diagnoses:  Acute bronchitis, unspecified organism    New Prescriptions New Prescriptions   AZITHROMYCIN (ZITHROMAX) 250 MG TABLET    Take 1 tablet (250 mg total) by mouth daily. Take first 2 tablets together, then 1 every day until finished.   BENZONATATE (TESSALON) 100 MG CAPSULE    Take 1 capsule (100 mg total) by mouth every 8 (eight) hours.     Leo Grosser, MD 06/21/16 [phone]

## 2016-06-24 MED FILL — NOVOLIN 70/30 100 UNITS/ML: (70-30) 100 | 20 days supply | Qty: 10 | Fill #1

## 2016-07-14 MED FILL — ATENOLOL 50 MG TABLET: 50 | 30 days supply | Qty: 45 | Fill #0 | Status: TO

## 2016-07-14 MED FILL — LEVEMIR FLEXTOUCH 100 UNITS: 100 | 30 days supply | Qty: 15 | Fill #0 | Status: TO

## 2016-07-14 MED FILL — NOVOLIN 70/30 100 UNITS/ML: (70-30) 100 | 20 days supply | Qty: 10 | Fill #2

## 2016-08-01 MED FILL — ATORVASTATIN 20 MG TABLET: 20 | 90 days supply | Qty: 90 | Fill #0 | Status: TO

## 2016-08-01 MED FILL — LISINOPRIL-HCTZ 20-25 MG TA: 20-25 | 90 days supply | Qty: 90 | Fill #0 | Status: TO

## 2016-08-01 MED FILL — BD PEN NDL MINI 31GX5MM: 31G X 5 MM | 90 days supply | Qty: 100 | Fill #0 | Status: TO

## 2016-08-01 MED FILL — NOVOLIN 70/30 100 UNITS/ML: (70-30) 100 | 20 days supply | Qty: 10 | Fill #3 | Status: TO

## 2016-08-18 MED FILL — methIMAzole 10 MG TABS: 10 | 30 days supply | Qty: 120 | Fill #0

## 2016-08-19 MED FILL — HUMULIN 70/30 VIAL: (70-30) 100 | 20 days supply | Qty: 10 | Fill #0

## 2016-09-09 MED FILL — TRUEPLUS SYR 0.5ML 30GX5/16: 30G X 5/16" | 50 days supply | Qty: 100 | Fill #1

## 2016-09-09 MED FILL — ATENOLOL 50 MG TABLET: 50 | 30 days supply | Qty: 45 | Fill #0

## 2016-09-09 MED FILL — HUMULIN 70/30 VIAL: (70-30) 100 | 20 days supply | Qty: 10 | Fill #1

## 2016-09-13 MED FILL — LANTUS SOLOSTAR 100 UNITS/M: 100 | 90 days supply | Qty: 9 | Fill #0

## 2016-09-28 MED FILL — HUMULIN 70/30 VIAL: (70-30) 100 | 20 days supply | Qty: 10 | Fill #2

## 2016-10-14 DIAGNOSIS — I10 Essential (primary) hypertension: Secondary | ICD-10-CM | POA: Diagnosis not present

## 2016-10-14 DIAGNOSIS — E7800 Pure hypercholesterolemia, unspecified: Secondary | ICD-10-CM | POA: Diagnosis not present

## 2016-10-14 DIAGNOSIS — E0590 Thyrotoxicosis, unspecified without thyrotoxic crisis or storm: Secondary | ICD-10-CM | POA: Diagnosis not present

## 2016-10-14 DIAGNOSIS — E119 Type 2 diabetes mellitus without complications: Secondary | ICD-10-CM | POA: Diagnosis not present

## 2016-10-21 MED FILL — HUMULIN 70/30 VIAL: (70-30) 100 | 20 days supply | Qty: 10 | Fill #3

## 2016-10-24 MED FILL — JARDIANCE 10 MG TABLET: 10 | 30 days supply | Qty: 30 | Fill #0

## 2016-11-03 MED FILL — ATENOLOL 50 MG TABLET: 50 | 30 days supply | Qty: 45 | Fill #1

## 2016-11-03 MED FILL — methIMAzole 10 MG TABS: 10 | 30 days supply | Qty: 120 | Fill #1

## 2016-11-10 MED FILL — HUMULIN 70/30 VIAL: (70-30) 100 | 20 days supply | Qty: 10 | Fill #4

## 2016-12-02 MED FILL — LISINOPRIL-HCTZ 20-25 MG TA: 20-25 | 30 days supply | Qty: 30 | Fill #0

## 2016-12-02 MED FILL — HUMULIN 70/30 VIAL: (70-30) 100 | 20 days supply | Qty: 10 | Fill #5

## 2016-12-02 MED FILL — LANTUS SOLOSTAR 100 UNITS/M: 100 | 90 days supply | Qty: 9 | Fill #1

## 2016-12-02 MED FILL — ATORVASTATIN 20 MG TABLET: 20 | 90 days supply | Qty: 90 | Fill #0

## 2016-12-06 MED FILL — ULTICARE SYR 1 ML 30GX5/16": 30G X 5/16" | 50 days supply | Qty: 100 | Fill #0

## 2016-12-06 MED FILL — ULTICARE SYR 1 ML 30GX5/16: 30G X 5/16" | 50 days supply | Qty: 100 | Fill #0

## 2016-12-15 MED FILL — FREESTYLE LANCETS: 90 days supply | Qty: 100 | Fill #0

## 2016-12-15 MED FILL — FREESTYLE LITE TEST STRIP: 90 days supply | Qty: 100 | Fill #0

## 2016-12-15 MED FILL — FREESTYLE LITE METER: 1 days supply | Qty: 1 | Fill #0

## 2016-12-26 MED FILL — HUMULIN 70/30 VIAL: (70-30) 100 | 20 days supply | Qty: 10 | Fill #6

## 2017-01-12 MED FILL — LISINOPRIL-HCTZ 20-25 MG TA: 20-25 | 60 days supply | Qty: 60 | Fill #1

## 2017-01-12 MED FILL — methIMAzole 10 MG TABS: 10 | 30 days supply | Qty: 120 | Fill #2

## 2017-01-12 MED FILL — JARDIANCE 10 MG TABLET: 10 | 90 days supply | Qty: 90 | Fill #1

## 2017-01-12 MED FILL — ATENOLOL 50 MG TABLET: 50 | 90 days supply | Qty: 135 | Fill #2

## 2017-01-16 DIAGNOSIS — Z7984 Long term (current) use of oral hypoglycemic drugs: Secondary | ICD-10-CM | POA: Diagnosis not present

## 2017-01-16 DIAGNOSIS — Z794 Long term (current) use of insulin: Secondary | ICD-10-CM | POA: Diagnosis not present

## 2017-01-16 DIAGNOSIS — E119 Type 2 diabetes mellitus without complications: Secondary | ICD-10-CM | POA: Diagnosis not present

## 2017-01-16 DIAGNOSIS — Z79899 Other long term (current) drug therapy: Secondary | ICD-10-CM | POA: Diagnosis not present

## 2017-01-20 MED FILL — HUMULIN 70/30 VIAL: (70-30) 100 | 20 days supply | Qty: 10 | Fill #0

## 2017-02-10 MED FILL — HUMULIN 70/30 VIAL: (70-30) 100 | 20 days supply | Qty: 10 | Fill #1

## 2017-02-13 MED FILL — LANTUS SOLOSTAR 100 UNITS/M: 100 | 50 days supply | Qty: 15 | Fill #0

## 2017-02-23 ENCOUNTER — Encounter (HOSPITAL_BASED_OUTPATIENT_CLINIC_OR_DEPARTMENT_OTHER): Payer: Self-pay | Admitting: Emergency Medicine

## 2017-02-23 ENCOUNTER — Emergency Department (HOSPITAL_BASED_OUTPATIENT_CLINIC_OR_DEPARTMENT_OTHER)
Admission: EM | Admit: 2017-02-23 | Discharge: 2017-02-23 | Disposition: A | Discharge status: Home / Self Care | Payer: 59 | Attending: Emergency Medicine | Admitting: Emergency Medicine

## 2017-02-23 DIAGNOSIS — J029 Acute pharyngitis, unspecified: Secondary | ICD-10-CM | POA: Diagnosis not present

## 2017-02-23 DIAGNOSIS — Z794 Long term (current) use of insulin: Secondary | ICD-10-CM | POA: Diagnosis not present

## 2017-02-23 DIAGNOSIS — E119 Type 2 diabetes mellitus without complications: Secondary | ICD-10-CM | POA: Insufficient documentation

## 2017-02-23 DIAGNOSIS — I10 Essential (primary) hypertension: Secondary | ICD-10-CM | POA: Insufficient documentation

## 2017-02-23 DIAGNOSIS — Z79899 Other long term (current) drug therapy: Secondary | ICD-10-CM | POA: Insufficient documentation

## 2017-02-23 LAB — RAPID STREP SCREEN (MED CTR MEBANE ONLY): Streptococcus, Group A Screen (Direct): NEGATIVE

## 2017-02-23 MED ORDER — IBUPROFEN 800 MG PO TABS
800.0000 mg | ORAL_TABLET | Freq: Once | ORAL | Status: AC
  Administered 2017-02-23: 800 mg via ORAL
  Filled 2017-02-23: qty 1

## 2017-02-23 MED ORDER — LIDOCAINE VISCOUS 2 % MT SOLN
15.0000 mL | Freq: Once | OROMUCOSAL | Status: AC
  Administered 2017-02-23: 15 mL via OROMUCOSAL
  Filled 2017-02-23: qty 15

## 2017-02-23 MED ORDER — ACETAMINOPHEN 500 MG PO TABS
1000.0000 mg | ORAL_TABLET | Freq: Once | ORAL | Status: AC
  Administered 2017-02-23: 1000 mg via ORAL
  Filled 2017-02-23: qty 2

## 2017-02-23 MED ORDER — IBUPROFEN 800 MG PO TABS: 800.0000 mg | ORAL_TABLET | Freq: Three times a day (TID) | ORAL | 0 refills | Status: DC

## 2017-02-23 MED ORDER — CETIRIZINE HCL 10 MG PO TABS: 10.0000 mg | ORAL_TABLET | Freq: Every day | ORAL | 0 refills | Status: DC

## 2017-02-23 MED ORDER — SUCRALFATE 1 GM/10ML PO SUSP: 1.0000 g | Freq: Three times a day (TID) | ORAL | 0 refills | Status: DC

## 2017-02-23 MED FILL — CARAFATE 1 GM/10 ML SUSP: 1 | 11 days supply | Qty: 420 | Fill #0

## 2017-02-23 MED FILL — IBUPROFEN 800 MG TABLET: 800 | 7 days supply | Qty: 21 | Fill #0

## 2017-02-23 MED FILL — ALL DAY ALLERGY 10 MG TAB: 10 | 100 days supply | Qty: 100 | Fill #0

## 2017-02-23 NOTE — ED Provider Notes (Signed)
[facility] DEPT [facility] Provider Note   CSN: [id number] Arrival date & time: 02/23/17  0524     History   Chief Complaint Chief Complaint  Patient presents with  . Sore Throat    HPI Rhonda Baldwin is a 36 y.o. female.  The history is provided by the patient.  Sore Throat  This is a new problem. The current episode started yesterday. The problem occurs constantly. The problem has not changed since onset.Pertinent negatives include no chest pain, no abdominal pain, no headaches and no shortness of breath. Nothing aggravates the symptoms. Nothing relieves the symptoms. She has tried nothing for the symptoms. The treatment provided no relief.  Otalgia  This is a new problem. The current episode started yesterday. There is pain in both ears. The problem occurs constantly. The problem has not changed since onset.There has been no fever. The pain is at a severity of 4/10. The pain is mild. Associated symptoms include sore throat. Pertinent negatives include no ear discharge, no headaches, no hearing loss, no abdominal pain, no diarrhea, no vomiting, no neck pain and no cough. Her past medical history does not include chronic ear infection.  Also has bilateral ear pain.  She has not tried anything for her symptoms.  No f/c/r.  No change in voice.  Is taking POs well.    Past Medical History:  Diagnosis Date  . Diabetes mellitus   . Hypertension   . Thyroid disease     Patient Active Problem List   Diagnosis Date Noted  . Menorrhagia, premenopausal 12/15/2014  . Need for prophylactic vaccination and inoculation against influenza 04/17/2014  . Essential hypertension 04/17/2014  . Diabetes ([place]) 11/28/2013  . Obesity, unspecified 05/03/2013  . Primary hyperthyroidism 08/19/2012  . Pneumonia 08/17/2012  . Nausea vomiting and diarrhea 08/17/2012  . Gastritis 08/17/2012  . HTN (hypertension) 08/17/2012  . DM (diabetes mellitus) ([place]) 08/17/2012    Past Surgical History:    Procedure Laterality Date  . CESAREAN SECTION    . CHOLECYSTECTOMY      OB History    No data available       Home Medications    Prior to Admission medications   Medication Sig Start Date End Date Taking? Authorizing Provider  atenolol (TENORMIN) 50 MG tablet Take 1.5 tablets (75 mg total) by mouth daily. 12/15/14   Tresa Garter, MD  atorvastatin (LIPITOR) 20 MG tablet Take 20 mg by mouth daily.    [provider]  azithromycin (ZITHROMAX) 250 MG tablet Take 1 tablet (250 mg total) by mouth daily. Take first 2 tablets together, then 1 every day until finished. 06/21/16   Leo Grosser, MD  benzonatate (TESSALON) 100 MG capsule Take 1 capsule (100 mg total) by mouth every 8 (eight) hours. 06/21/16   Leo Grosser, MD  glucose monitoring kit (FREESTYLE) monitoring kit 1 each by Does not apply route as needed for other. 08/23/13   Reyne Dumas, MD  ibuprofen (ADVIL,MOTRIN) 800 MG tablet Take 1 tablet (800 mg total) by mouth 3 (three) times daily. 02/27/15   Evelina Bucy, MD  insulin detemir (LEVEMIR) 100 UNIT/ML injection Inject 25 Units into the skin at bedtime.     [provider]  insulin NPH-regular Human (NOVOLIN 70/30) (70-30) 100 UNIT/ML injection Inject 20 Units into the skin 2 (two) times daily with a meal. Patient taking differently: Inject 25 Units into the skin 2 (two) times daily with a meal.  12/15/14   Tresa Garter, MD  Lancets (FREESTYLE) lancets Use as instructed 01/19/15   Tresa Garter, MD  lisinopril-hydrochlorothiazide (PRINZIDE,ZESTORETIC) 20-25 MG per tablet Take 1 tablet by mouth daily. 12/15/14   Tresa Garter, MD  loratadine (CLARITIN) 10 MG tablet Take 1 tablet (10 mg total) by mouth daily as needed for allergies. 12/15/14   Tresa Garter, MD  metFORMIN (GLUCOPHAGE) 1000 MG tablet Take 1 tablet (1,000 mg total) by mouth 2 (two) times daily with a meal. Patient taking differently: Take 1,000 mg by mouth daily with  breakfast.  12/15/14   Tresa Garter, MD  metFORMIN (GLUCOPHAGE) 500 MG tablet Take 500 mg by mouth 2 (two) times daily with a meal. Reported on 01/28/2016    [provider]  methimazole (TAPAZOLE) 10 MG tablet Take 2 tablets (20 mg total) by mouth 2 (two) times daily. 12/22/14   Tresa Garter, MD  naproxen sodium (ANAPROX) 220 MG tablet Take 440 mg by mouth 2 (two) times daily with a meal. Reported on 08/11/2015    [provider]  Garner X 5/16" 0.5 ML MISC USE AS DIRECTED WITH INSULIN 02/13/15   Tresa Garter, MD    Family History Family History  Problem Relation Age of Onset  . Hypertension Mother   . Hypertension Father     Social History Social History  Substance Use Topics  . Smoking status: Never Smoker  . Smokeless tobacco: Never Used  . Alcohol use No     Allergies   Orange   Review of Systems Review of Systems  Constitutional: Negative for fever.  HENT: Positive for ear pain and sore throat. Negative for congestion, drooling, ear discharge, facial swelling, hearing loss, trouble swallowing and voice change.   Respiratory: Negative for cough and shortness of breath.   Cardiovascular: Negative for chest pain.  Gastrointestinal: Negative for abdominal pain, diarrhea and vomiting.  Musculoskeletal: Negative for neck pain.  Neurological: Negative for headaches.  All other systems reviewed and are negative.    Physical Exam Updated Vital Signs BP (!) 138/105 (BP Location: Right Arm)   Pulse 86   Temp 98.6 F (37 C) (Oral)   Ht _0  (1.626 m)   Wt (!) 145.2 kg (320 lb)   SpO2 100%   BMI 54.93 kg/m   Physical Exam  Constitutional: She is oriented to person, place, and time. She appears well-developed and well-nourished. No distress.  HENT:  Head: Normocephalic and atraumatic.  Mouth/Throat: Oropharynx is clear and moist. No oropharyngeal exudate.  Eyes: Pupils are equal, round, and reactive to light.  Conjunctivae are normal.  Neck: Normal range of motion. Neck supple. No JVD present.  Intact phonation. No pain with displacement of the larynx   Cardiovascular: Normal rate, regular rhythm, normal heart sounds and intact distal pulses.   Pulmonary/Chest: Effort normal and breath sounds normal. No stridor. No respiratory distress. She has no wheezes. She has no rales.  Abdominal: Soft. Bowel sounds are normal. She exhibits no mass. There is no tenderness. There is no rebound and no guarding.  Musculoskeletal: Normal range of motion.  Lymphadenopathy:    She has no cervical adenopathy.  Neurological: She is alert and oriented to person, place, and time.  Skin: Skin is warm and dry. Capillary refill takes less than 2 seconds.  Psychiatric: She has a normal mood and affect.     ED Treatments / Results   Vitals:   02/23/17 0546  BP: (!) 138/105  Pulse: 86  Temp:  98.6 F (37 C)    Labs (all labs ordered are listed, but only abnormal results are displayed)  Results for orders placed or performed during the hospital encounter of 02/23/17  Rapid strep screen  Result Value Ref Range   Streptococcus, Group A Screen (Direct) NEGATIVE NEGATIVE   No results found.  Procedures Procedures (including critical care time)  Medications Ordered in ED Medications  ibuprofen (ADVIL,MOTRIN) tablet 800 mg (not administered)  acetaminophen (TYLENOL) tablet 1,000 mg (not administered)  lidocaine (XYLOCAINE) 2 % viscous mouth solution 15 mL (not administered)     Based on the CENTOR criteria there is no indication for further testing or treatment  Final Clinical Impressions(s) / ED Diagnoses   Viral pharyngitis: Return for  weakness, inability to tolerate oral medication, worsening pain, fevers, altered level of consciousness,change in voice, inability to handle own saliva, drooling, neck pain or swelling, shortness of breath or any concerns. Recheck with your PMD in 2 days. The patient is  nontoxic-appearing on exam and vital signs are within normal limits.   I have reviewed the triage vital signs and the nursing notes. Pertinent labs &imaging results that were available during my care of the patient were reviewed by me and considered in my medical decision making (see chart for details).  After history, exam, and medical workup I feel the patient has been appropriately medically screened and is safe for discharge home. Pertinent diagnoses were discussed with the patient. Patient was given return precautions.     Goran Olden, MD 02/23/17 [phone]

## 2017-02-23 NOTE — ED Triage Notes (Signed)
C/o body aches x 2 days  sorethroat and bil;ateral ear pain onset yesterday getting worse, runny nose

## 2017-02-25 LAB — CULTURE, GROUP A STREP (THRC)

## 2017-03-03 MED FILL — HUMULIN 70/30 VIAL: (70-30) 100 | 20 days supply | Qty: 10 | Fill #2

## 2017-03-31 MED FILL — HUMULIN 70/30 VIAL: (70-30) 100 | 20 days supply | Qty: 10 | Fill #3

## 2017-03-31 MED FILL — LISINOPRIL-HCTZ 20-25 MG TA: 20-25 | 30 days supply | Qty: 30 | Fill #0

## 2017-03-31 MED FILL — methIMAzole 10 MG TABS: 10 | 30 days supply | Qty: 120 | Fill #3

## 2017-04-10 MED FILL — PENTIPS 31G X 5 MM MISC: 31G X 5 MM | 90 days supply | Qty: 100 | Fill #0

## 2017-04-10 MED FILL — LANTUS SOLOSTAR 100 UNITS/M: 100 | 50 days supply | Qty: 15 | Fill #1

## 2017-04-11 MED FILL — ATORVASTATIN 20 MG TABLET: 20 | 90 days supply | Qty: 30 | Fill #0

## 2017-04-26 MED FILL — HUMULIN 70/30 VIAL: (70-30) 100 | 20 days supply | Qty: 10 | Fill #4

## 2017-04-26 MED FILL — JARDIANCE 10 MG TABLET: 10 | 30 days supply | Qty: 30 | Fill #0

## 2017-04-26 MED FILL — metFORMIN HCL 1000 MG TABS: 1000 | 30 days supply | Qty: 60 | Fill #0

## 2017-06-29 ENCOUNTER — Other Ambulatory Visit: Payer: Self-pay

## 2017-06-29 NOTE — Patient Outreach (Signed)
[facility] Care Management  06/29/2017  Rhonda CooperSharona Y Baldwin 08/13/1981 [id number]   Notified by member through [facility] program that she is no longer a [employer] employee and no longer has the [employer] insurance. Instructed that her [facility] program will be closed.   Case closed as member has dis-enrolled from coverage. Case closure letter to be sent to provider. Dudley MajorMelissa Faten Frieson RN, [facility],CCM Care Management Coordinator-[facility] Care [facility] [phone]

## 2017-07-02 ENCOUNTER — Other Ambulatory Visit: Payer: Self-pay

## 2017-07-02 ENCOUNTER — Emergency Department (HOSPITAL_BASED_OUTPATIENT_CLINIC_OR_DEPARTMENT_OTHER)
Admission: EM | Admit: 2017-07-02 | Discharge: 2017-07-02 | Disposition: A | Discharge status: Home / Self Care | Payer: 59 | Attending: Emergency Medicine | Admitting: Emergency Medicine

## 2017-07-02 DIAGNOSIS — Z79899 Other long term (current) drug therapy: Secondary | ICD-10-CM | POA: Insufficient documentation

## 2017-07-02 DIAGNOSIS — Z794 Long term (current) use of insulin: Secondary | ICD-10-CM | POA: Insufficient documentation

## 2017-07-02 DIAGNOSIS — E119 Type 2 diabetes mellitus without complications: Secondary | ICD-10-CM | POA: Insufficient documentation

## 2017-07-02 DIAGNOSIS — R739 Hyperglycemia, unspecified: Secondary | ICD-10-CM

## 2017-07-02 DIAGNOSIS — I10 Essential (primary) hypertension: Secondary | ICD-10-CM | POA: Insufficient documentation

## 2017-07-02 DIAGNOSIS — Z760 Encounter for issue of repeat prescription: Secondary | ICD-10-CM | POA: Insufficient documentation

## 2017-07-02 DIAGNOSIS — E1165 Type 2 diabetes mellitus with hyperglycemia: Secondary | ICD-10-CM

## 2017-07-02 DIAGNOSIS — E039 Hypothyroidism, unspecified: Secondary | ICD-10-CM | POA: Insufficient documentation

## 2017-07-02 DIAGNOSIS — E0590 Thyrotoxicosis, unspecified without thyrotoxic crisis or storm: Secondary | ICD-10-CM

## 2017-07-02 LAB — CBG MONITORING, ED: Glucose-Capillary: 245 mg/dL — ABNORMAL HIGH (ref 65–99)

## 2017-07-02 MED ORDER — METFORMIN HCL 1000 MG PO TABS: 1000.0000 mg | ORAL_TABLET | Freq: Two times a day (BID) | ORAL | 1 refills | Status: DC

## 2017-07-02 MED ORDER — ATORVASTATIN CALCIUM 20 MG PO TABS: 20.0000 mg | ORAL_TABLET | Freq: Every day | ORAL | 1 refills | Status: DC

## 2017-07-02 MED ORDER — INSULIN NPH ISOPHANE & REGULAR (70-30) 100 UNIT/ML ~~LOC~~ SUSP: 25.0000 [IU] | Freq: Two times a day (BID) | SUBCUTANEOUS | 2 refills | Status: DC

## 2017-07-02 MED ORDER — ATENOLOL 50 MG PO TABS: 75.0000 mg | ORAL_TABLET | Freq: Every day | ORAL | 1 refills | Status: DC

## 2017-07-02 MED ORDER — METHIMAZOLE 10 MG PO TABS: 20.0000 mg | ORAL_TABLET | Freq: Two times a day (BID) | ORAL | 1 refills | Status: DC

## 2017-07-02 MED ORDER — LISINOPRIL-HYDROCHLOROTHIAZIDE 20-25 MG PO TABS: 1.0000 | ORAL_TABLET | Freq: Every day | ORAL | 1 refills | Status: AC

## 2017-07-02 NOTE — ED Provider Notes (Signed)
[facility] EMERGENCY DEPARTMENT Provider Note   CSN: [id number] Arrival date & time: 07/02/17  0555     History   Chief Complaint Chief Complaint  Patient presents with  . Medication Refill    HPI Rhonda Baldwin is a 36 y.o. female.  Patient is a 36 year old female with a history of diabetes, hypertension and hyperthyroidism who presents requesting medication refill.  She states that she has been out of her medications for about a month due to lack of insurance.  She recently started a new job and has insurance that will kick in December 22 but she currently needs a refill on her medications.  She was previously being seen at [facility] but is unable to be seen there due to an unpaid bill.  She states that she is noticing that she is having more palpitations and some occasional shortness of breath when she is walking longer distances.  She denies any current chest pain or shortness of breath.  She denies any other current symptoms.      Past Medical History:  Diagnosis Date  . Diabetes mellitus   . Hypertension   . Thyroid disease     Patient Active Problem List   Diagnosis Date Noted  . Menorrhagia, premenopausal 12/15/2014  . Need for prophylactic vaccination and inoculation against influenza 04/17/2014  . Essential hypertension 04/17/2014  . Diabetes ([place]) 11/28/2013  . Obesity, unspecified 05/03/2013  . Primary hyperthyroidism 08/19/2012  . Pneumonia 08/17/2012  . Nausea vomiting and diarrhea 08/17/2012  . Gastritis 08/17/2012  . HTN (hypertension) 08/17/2012  . DM (diabetes mellitus) ([place]) 08/17/2012    Past Surgical History:  Procedure Laterality Date  . CESAREAN SECTION    . CHOLECYSTECTOMY      OB History    No data available       Home Medications    Prior to Admission medications   Medication Sig Start Date End Date Taking? Authorizing Provider  azithromycin (ZITHROMAX) 250 MG tablet Take 1 tablet (250 mg total) by mouth  daily. Take first 2 tablets together, then 1 every day until finished. 06/21/16  Yes Leo Grosser, MD  benzonatate (TESSALON) 100 MG capsule Take 1 capsule (100 mg total) by mouth every 8 (eight) hours. 06/21/16  Yes Leo Grosser, MD  cetirizine (ZYRTEC ALLERGY) 10 MG tablet Take 1 tablet (10 mg total) by mouth daily. 02/23/17  Yes Palumbo, April, MD  sucralfate (CARAFATE) 1 GM/10ML suspension Take 10 mLs (1 g total) by mouth 4 (four) times daily -  with meals and at bedtime. 02/23/17  Yes Palumbo, April, MD  atenolol (TENORMIN) 50 MG tablet Take 1.5 tablets (75 mg total) daily by mouth. 07/02/17   Malvin Johns, MD  atorvastatin (LIPITOR) 20 MG tablet Take 1 tablet (20 mg total) daily by mouth. 07/02/17   Malvin Johns, MD  glucose monitoring kit (FREESTYLE) monitoring kit 1 each by Does not apply route as needed for other. 08/23/13   Reyne Dumas, MD  ibuprofen (ADVIL,MOTRIN) 800 MG tablet Take 1 tablet (800 mg total) by mouth 3 (three) times daily. 02/23/17   Palumbo, April, MD  insulin detemir (LEVEMIR) 100 UNIT/ML injection Inject 25 Units into the skin at bedtime.     [provider]  insulin NPH-regular Human (NOVOLIN 70/30) (70-30) 100 UNIT/ML injection Inject 25 Units 2 (two) times daily with a meal into the skin. 07/02/17   Malvin Johns, MD  Lancets (FREESTYLE) lancets Use as instructed 01/19/15   Tresa Garter, MD  lisinopril-hydrochlorothiazide (PRINZIDE,ZESTORETIC) 20-25 MG tablet Take 1 tablet daily by mouth. 07/02/17   Malvin Johns, MD  loratadine (CLARITIN) 10 MG tablet Take 1 tablet (10 mg total) by mouth daily as needed for allergies. 12/15/14   Tresa Garter, MD  metFORMIN (GLUCOPHAGE) 1000 MG tablet Take 1 tablet (1,000 mg total) 2 (two) times daily with a meal by mouth. 07/02/17   Malvin Johns, MD  methimazole (TAPAZOLE) 10 MG tablet Take 2 tablets (20 mg total) 2 (two) times daily by mouth. 07/02/17   Malvin Johns, MD  naproxen sodium (ANAPROX) 220 MG  tablet Take 440 mg by mouth 2 (two) times daily with a meal. Reported on 08/11/2015    [provider]  TRUEPLUS INSULIN SYRINGE 30G X 5/16" 0.5 ML MISC USE AS DIRECTED WITH INSULIN 02/13/15   Tresa Garter, MD    Family History Family History  Problem Relation Age of Onset  . Hypertension Mother   . Hypertension Father     Social History Social History   Tobacco Use  . Smoking status: Never Smoker  . Smokeless tobacco: Never Used  Substance Use Topics  . Alcohol use: No  . Drug use: No     Allergies   Orange   Review of Systems Review of Systems  Constitutional: Negative for chills, diaphoresis, fatigue and fever.  HENT: Negative for congestion, rhinorrhea and sneezing.   Eyes: Negative.   Respiratory: Positive for shortness of breath. Negative for cough and chest tightness.   Cardiovascular: Positive for palpitations. Negative for chest pain and leg swelling.  Gastrointestinal: Negative for abdominal pain, blood in stool, diarrhea, nausea and vomiting.  Genitourinary: Negative for difficulty urinating, flank pain, frequency and hematuria.  Musculoskeletal: Negative for arthralgias and back pain.  Skin: Negative for rash.  Neurological: Negative for dizziness, speech difficulty, weakness, numbness and headaches.     Physical Exam Updated Vital Signs BP (!) 171/124   Pulse (!) 103   Temp 98.3 F (36.8 C) (Oral)   Resp 20   Ht '5\' 3"'$  (1.6 m)   Wt (!) 145.2 kg (320 lb)   LMP 06/18/2017   SpO2 100%   BMI 56.69 kg/m   Physical Exam  Constitutional: She is oriented to person, place, and time. She appears well-developed and well-nourished.  Obese  HENT:  Head: Normocephalic and atraumatic.  Eyes: Pupils are equal, round, and reactive to light.  Neck: Normal range of motion. Neck supple.  Cardiovascular: Normal rate, regular rhythm and normal heart sounds.  Pulmonary/Chest: Effort normal and breath sounds normal. No respiratory distress. She has  no wheezes. She has no rales. She exhibits no tenderness.  Abdominal: Soft. Bowel sounds are normal. There is no tenderness. There is no rebound and no guarding.  Musculoskeletal: Normal range of motion. She exhibits no edema.  Lymphadenopathy:    She has no cervical adenopathy.  Neurological: She is alert and oriented to person, place, and time.  Skin: Skin is warm and dry. No rash noted.  Psychiatric: She has a normal mood and affect.     ED Treatments / Results  Labs (all labs ordered are listed, but only abnormal results are displayed) Labs Reviewed  CBG MONITORING, ED - Abnormal; Notable for the following components:      Result Value   Glucose-Capillary 245 (*)    All other components within normal limits    EKG  EKG Interpretation None       Radiology No results found.  Procedures Procedures (including  critical care time)  Medications Ordered in ED Medications - No data to display   Initial Impression / Assessment and Plan / ED Course  I have reviewed the triage vital signs and the nursing notes.  Pertinent labs & imaging results that were available during my care of the patient were reviewed by me and considered in my medical decision making (see chart for details).     Patient presents for medication refill.  She is currently asymptomatic.  She does have some mild tachycardia and marked hypertension however she is currently asymptomatic.  This is likely explained due to her lack of indications including antihypertensive medications, beta-blockers and her thyroid medication.  I did give her a prescription for her medications.  I gave her information about following up with the [facility] and wellness center to possibly help fill her prescriptions.  She is going to establish care with a primary care provider when she gets her insurance.  Return precautions were given.  Final Clinical Impressions(s) / ED Diagnoses   Final diagnoses:  Medication refill    Hypertension, unspecified type  Hyperglycemia    ED Discharge Orders        Ordered    atorvastatin (LIPITOR) 20 MG tablet  Daily     07/02/17 0627    methimazole (TAPAZOLE) 10 MG tablet  2 times daily     07/02/17 0627    metFORMIN (GLUCOPHAGE) 1000 MG tablet  2 times daily with meals     07/02/17 0627    lisinopril-hydrochlorothiazide (PRINZIDE,ZESTORETIC) 20-25 MG tablet  Daily     07/02/17 0627    atenolol (TENORMIN) 50 MG tablet  Daily     07/02/17 0627    insulin NPH-regular Human (NOVOLIN 70/30) (70-30) 100 UNIT/ML injection  2 times daily with meals     07/02/17 0627       Malvin Johns, MD 07/02/17 [phone]

## 2017-07-02 NOTE — ED Triage Notes (Signed)
Pt states she is out of her daily medications due to a lapse in insurance. She is here today requesting refills for her home meds.

## 2017-07-03 MED FILL — ATORVASTATIN 20 MG TABLET: 20 | 30 days supply | Qty: 30 | Fill #1

## 2017-07-03 MED FILL — ATORVASTATIN 20 MG TABLET: 20 | 30 days supply | Qty: 30 | Fill #0

## 2017-07-03 MED FILL — NOVOLIN 70/30 100 UNITS/ML: (70-30) 100 | 20 days supply | Qty: 10 | Fill #0

## 2017-07-03 MED FILL — LISINOPRIL-HCTZ 20-25 MG TA: 20-25 | 30 days supply | Qty: 30 | Fill #0

## 2017-07-03 MED FILL — ATENOLOL 50 MG TABLET: 50 | 30 days supply | Qty: 45 | Fill #0

## 2017-07-03 MED FILL — methIMAzole 10 MG TABS: 10 | 30 days supply | Qty: 120 | Fill #0

## 2017-07-03 MED FILL — metFORMIN HCL 1000 MG TABS: 1000 | 30 days supply | Qty: 60 | Fill #0

## 2017-07-28 MED FILL — NOVOLIN 70/30 100 UNITS/ML: (70-30) 100 | 20 days supply | Qty: 10 | Fill #1

## 2017-08-04 MED FILL — LISINOPRIL-HCTZ 20-25 MG TA: 20-25 | 30 days supply | Qty: 30 | Fill #1

## 2017-08-04 MED FILL — ATENOLOL 50 MG TABLET: 50 | 30 days supply | Qty: 45 | Fill #1

## 2017-08-04 MED FILL — methIMAzole 10 MG TABS: 10 | 30 days supply | Qty: 120 | Fill #1

## 2017-08-24 MED FILL — NOVOLIN 70/30 100 UNITS/ML: (70-30) 100 | 20 days supply | Qty: 10 | Fill #2

## 2017-09-15 MED FILL — ATORVASTATIN 20 MG TABLET: 20 | 30 days supply | Qty: 30 | Fill #1

## 2017-09-22 MED FILL — HUMULIN 70/30 KWIKPEN: (70-30) 100 | 30 days supply | Qty: 15 | Fill #0

## 2017-09-22 MED FILL — metFORMIN HCL 1000 MG TABS: 1000 | 30 days supply | Qty: 60 | Fill #0

## 2017-09-22 MED FILL — LISINOPRIL-HCTZ 20-25 MG TA: 20-25 | 30 days supply | Qty: 30 | Fill #0

## 2017-09-22 MED FILL — ATORVASTATIN 20 MG TABLET: 20 | 30 days supply | Qty: 30 | Fill #0

## 2017-09-22 MED FILL — ATENOLOL 50 MG TABLET: 50 | 30 days supply | Qty: 45 | Fill #0

## 2017-09-22 MED FILL — methIMAzole 10 MG TABS: 10 | 30 days supply | Qty: 60 | Fill #0

## 2017-10-24 MED FILL — HUMULIN 70/30 KWIKPEN: (70-30) 100 | 30 days supply | Qty: 15 | Fill #1

## 2017-10-30 MED FILL — ATENOLOL 50 MG TABLET: 50 | 30 days supply | Qty: 45 | Fill #1

## 2017-10-30 MED FILL — LISINOPRIL-HCTZ 20-25 MG TA: 20-25 | 30 days supply | Qty: 30 | Fill #1

## 2017-10-31 MED FILL — ONE TOUCH ULTRA TEST STRIPS: 25 days supply | Qty: 50 | Fill #0

## 2017-10-31 MED FILL — ONE TOUCH ULTRAMINI METER: W/DEVICE | 30 days supply | Qty: 1 | Fill #0

## 2017-10-31 MED FILL — ONE TOUCH ULTRASOFT LANCETS: 30 days supply | Qty: 100 | Fill #0

## 2017-11-01 MED FILL — PENTIPS 31G X 5 MM MISC: 31G X 5 MM | 90 days supply | Qty: 100 | Fill #0

## 2017-11-10 MED FILL — metFORMIN HCL 1000 MG TABS: 1000 | 30 days supply | Qty: 60 | Fill #1

## 2017-11-10 MED FILL — ATORVASTATIN 20 MG TABLET: 20 | 30 days supply | Qty: 30 | Fill #1

## 2017-11-10 MED FILL — methIMAzole 10 MG TABS: 10 | 30 days supply | Qty: 60 | Fill #1

## 2017-11-22 MED FILL — HUMULIN 70/30 KWIKPEN: (70-30) 100 | 30 days supply | Qty: 15 | Fill #2 | Status: TO

## 2017-11-30 MED FILL — LISINOPRIL-HCTZ 20-25 MG TA: 20-25 | 30 days supply | Qty: 30 | Fill #2

## 2017-11-30 MED FILL — methIMAzole 10 MG TABS: 10 | 30 days supply | Qty: 60 | Fill #2

## 2017-12-14 MED FILL — ONE TOUCH ULTRA TEST STRIPS: 25 days supply | Qty: 50 | Fill #1

## 2017-12-25 ENCOUNTER — Emergency Department (HOSPITAL_COMMUNITY): Payer: BLUE CROSS/BLUE SHIELD

## 2017-12-25 ENCOUNTER — Encounter (HOSPITAL_COMMUNITY): Payer: Self-pay | Admitting: Emergency Medicine

## 2017-12-25 ENCOUNTER — Other Ambulatory Visit: Payer: Self-pay

## 2017-12-25 ENCOUNTER — Emergency Department (HOSPITAL_COMMUNITY)
Admission: EM | Admit: 2017-12-25 | Discharge: 2017-12-25 | Disposition: A | Discharge status: Home / Self Care | Payer: BLUE CROSS/BLUE SHIELD | Attending: Emergency Medicine | Admitting: Emergency Medicine

## 2017-12-25 DIAGNOSIS — Z794 Long term (current) use of insulin: Secondary | ICD-10-CM | POA: Insufficient documentation

## 2017-12-25 DIAGNOSIS — Y998 Other external cause status: Secondary | ICD-10-CM | POA: Diagnosis not present

## 2017-12-25 DIAGNOSIS — I10 Essential (primary) hypertension: Secondary | ICD-10-CM | POA: Insufficient documentation

## 2017-12-25 DIAGNOSIS — E119 Type 2 diabetes mellitus without complications: Secondary | ICD-10-CM | POA: Diagnosis not present

## 2017-12-25 DIAGNOSIS — M25561 Pain in right knee: Secondary | ICD-10-CM | POA: Diagnosis not present

## 2017-12-25 DIAGNOSIS — Z79899 Other long term (current) drug therapy: Secondary | ICD-10-CM | POA: Insufficient documentation

## 2017-12-25 DIAGNOSIS — Y929 Unspecified place or not applicable: Secondary | ICD-10-CM | POA: Insufficient documentation

## 2017-12-25 DIAGNOSIS — M791 Myalgia, unspecified site: Secondary | ICD-10-CM | POA: Diagnosis not present

## 2017-12-25 DIAGNOSIS — M7918 Myalgia, other site: Secondary | ICD-10-CM

## 2017-12-25 DIAGNOSIS — Y939 Activity, unspecified: Secondary | ICD-10-CM | POA: Insufficient documentation

## 2017-12-25 MED ORDER — METHOCARBAMOL 750 MG PO TABS: 750.0000 mg | ORAL_TABLET | Freq: Three times a day (TID) | ORAL | 0 refills | Status: DC | PRN
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
## 2017-12-25 NOTE — Discharge Instructions (Addendum)
Please take Ibuprofen (Advil, motrin) and Tylenol (acetaminophen) to relieve your pain.  You may take up to 600 MG (3 pills) of normal strength ibuprofen every 8 hours as needed.  In between doses of ibuprofen you make take tylenol, up to 1,000 mg (two extra strength pills).  Do not take more than 3,000 mg tylenol in a 24 hour period.  Please check all medication labels as many medications such as pain and cold medications may contain tylenol.  Do not drink alcohol while taking these medications.  Do not take other NSAID'S while taking ibuprofen (such as aleve or naproxen).  Please take ibuprofen with food to decrease stomach upset. ° °Today you received medications that may make you sleepy or impair your ability to make decisions.  For the next 24 hours please do not drive, operate heavy machinery, care for a small child with out another adult present, or perform any activities that may cause harm to you or someone else if you were to fall asleep or be impaired.  ° °You are being prescribed a medication which may make you sleepy. Please follow up of listed precautions for at least 24 hours after taking one dose. ° °The best way to get rid of muscle pain is by taking NSAIDS, using heat, massage therapy, and gentle stretching/range of motion exercises. ° °

## 2018-01-04 MED FILL — ATORVASTATIN 20 MG TABLET: 20 | 30 days supply | Qty: 30 | Fill #2

## 2018-01-04 MED FILL — LISINOPRIL-HCTZ 20-25 MG TA: 20-25 | 30 days supply | Qty: 30 | Fill #0

## 2018-01-04 MED FILL — metFORMIN HCL 1000 MG TABS: 1000 | 30 days supply | Qty: 60 | Fill #2

## 2018-01-04 MED FILL — ATENOLOL 50 MG TABLET: 50 | 30 days supply | Qty: 45 | Fill #0

## 2018-01-04 MED FILL — methIMAzole 10 MG TABS: 10 | 30 days supply | Qty: 60 | Fill #3

## 2018-03-02 MED FILL — ATORVASTATIN 20 MG TABLET: 20 | 30 days supply | Qty: 30 | Fill #3

## 2018-03-02 MED FILL — LISINOPRIL-HCTZ 20-25 MG TA: 20-25 | 30 days supply | Qty: 30 | Fill #1

## 2018-03-02 MED FILL — ATENOLOL 50 MG TABLET: 50 | 30 days supply | Qty: 45 | Fill #1

## 2018-04-11 MED FILL — ATENOLOL 50 MG TABLET: 50 | 30 days supply | Qty: 45 | Fill #2

## 2018-04-11 MED FILL — LISINOPRIL-HCTZ 20-25 MG TA: 20-25 | 30 days supply | Qty: 30 | Fill #2

## 2018-04-11 MED FILL — metFORMIN HCL 1000 MG TABS: 1000 | 30 days supply | Qty: 60 | Fill #3

## 2018-04-11 MED FILL — ATORVASTATIN CALCIUM 20 MG: 20 | 30 days supply | Qty: 30 | Fill #4

## 2018-04-12 ENCOUNTER — Ambulatory Visit: Payer: Self-pay | Admitting: Internal Medicine

## 2018-04-12 NOTE — Progress Notes (Deleted)
Patient ID: Rhonda Baldwin, female   DOB: 03-Feb-1981, 37 y.o.   MRN: [id number]    HPI  ROSENE Baldwin is a 37 y.o.-year-old female, referred by her PCP, Dr. Corky Downs, for evaluation and management of thyrotoxicosis.  She has had thyrotoxicosis since at least 2013.  She was started on methimazole in 2015 and also atenolol.  Reviewing records from PCP, at last visit with him in 01/2018 she was on methimazole 20 mg twice a day and the TSH returned elevated at 5.39.  Her methimazole dose was decreased to 10 mg 3 times a day then.  I reviewed pt's thyroid tests: Lab Results  Component Value Date   TSH <0.008 (L) 12/15/2014   TSH <0.008 (L) 08/23/2013   TSH <0.008 (L) 05/01/2013   TSH <0.008 (L) 08/18/2012   TSH <0.008 (L) 06/10/2012   FREET4 2.62 (H) 12/15/2014   FREET4 6.20 (H) 08/23/2013   FREET4 6.22 (H) 05/01/2013   FREET4 2.92 (H) 08/18/2012   Antithyroid antibodies: No results found for: TSI   Of note, she had a thyroid ultrasound (05/10/2013): Thyromegaly without nodules with heterogeneous parenchyma, but no nodules  Pt denies: - feeling nodules in neck - hoarseness - dysphagia - choking - SOB with lying down  She denies: - fatigue - excessive sweating/heat intolerance - tremors - anxiety - palpitations - hyperdefecation - weight loss - hair loss  Pt does not have a FH of thyroid ds. No FH of thyroid cancer. No h/o radiation tx to head or neck.  No seaweed or kelp, no recent contrast studies. No steroid use. No herbal supplements. No Biotin use.  Pt. also has uncontrolled type 2 diabetes, insulin-dependent:  Latest hemoglobin A1c was elevated (01/2018), at 11.6% and the CBG was 522.  At that time, a note was made that she was out of insulin, which was restarted since:  She is currently on:  - 70/30 insulin 35 units twice a day - Metformin 1000 mg 2x a day   ROS: Constitutional: no weight gain/loss, no fatigue, no subjective hyperthermia/hypothermia Eyes:  no blurry vision, no xerophthalmia ENT: no sore throat, + see HPI Cardiovascular: no CP/SOB/palpitations/leg swelling Respiratory: no cough/SOB Gastrointestinal: no N/V/D/C Musculoskeletal: no muscle/joint aches Skin: no rashes Neurological: no tremors/numbness/tingling/dizziness Psychiatric: no depression/anxiety  PE: There were no vitals taken for this visit. Wt Readings from Last 3 Encounters:  12/25/17 (!) 320 lb (145.2 kg)  07/02/17 (!) 320 lb (145.2 kg)  02/23/17 (!) 320 lb (145.2 kg)   Constitutional: obese, in NAD Eyes: PERRLA, EOMI, no exophthalmos, no lid lag, no stare ENT: moist mucous membranes, no thyromegaly, no thyroid bruits, no cervical lymphadenopathy Cardiovascular: RRR, No MRG Respiratory: CTA B Gastrointestinal: abdomen soft, NT, ND, BS+ Musculoskeletal: no deformities, strength intact in all 4 Skin: moist, warm, no rashes Neurological: no tremor with outstretched hands, DTR normal in all 4  ASSESSMENT: 1. Thyrotoxicosis  PLAN:  1. Patient with a recently found low TSH, with thyrotoxic sxs: weight loss, heat intolerance, hyperdefecation, palpitations, anxiety.  - she does not appear to have exogenous causes for the low TSH.  - We discussed that possible causes of thyrotoxicosis are:  Marland Kitchen Graves ds   . Thyroiditis . toxic multinodular goiter/ toxic adenoma (I cannot feel nodules at palpation of her thyroid). - will check the TSH, fT3 and fT4 and also add thyroid stimulating antibodies to screen for Graves' disease.  - If the tests remain abnormal, we may need an uptake and scan to differentiate  between the 3 above possible etiologies  - we discussed about possible modalities of treatment for the above conditions, to include methimazole use, radioactive iodine ablation or (last resort) surgery. - we may need to do thyroid ultrasound depending on the results of the uptake and scan (if a cold nodule is present) - I do not feel that we need to add beta blockers  at this time, since she is not tachycardic or tremulous - I advised her to join my chart to communicate easier - RTC in 3 months, but likely sooner for repeat labs  Carlus Pavlovristina Kyliah Deanda, MD PhD [facility]

## 2018-06-05 MED FILL — ATORVASTATIN CALCIUM 20 MG: 20 | 30 days supply | Qty: 30 | Fill #5

## 2018-06-05 MED FILL — metFORMIN HCL 1000 MG TABS: 1000 | 30 days supply | Qty: 60 | Fill #4

## 2018-06-05 MED FILL — LISINOPRIL-HCTZ 20-25 MG TA: 20-25 | 30 days supply | Qty: 30 | Fill #3

## 2018-06-05 MED FILL — ATENOLOL 50 MG TABLET: 50 | 30 days supply | Qty: 45 | Fill #3

## 2018-06-27 MED FILL — methIMAzole 10 MG TABS: 10 | 30 days supply | Qty: 60 | Fill #4

## 2018-07-23 MED FILL — LISINOPRIL-HCTZ 20-25 MG TA: 20-25 | 30 days supply | Qty: 30 | Fill #4

## 2018-07-23 MED FILL — ATENOLOL 50 MG TABLET: 50 | 30 days supply | Qty: 45 | Fill #4

## 2018-07-23 MED FILL — ATORVASTATIN CALCIUM 20 MG: 20 | 30 days supply | Qty: 30 | Fill #0

## 2018-07-31 MED FILL — methIMAzole 10 MG TABS: 10 | 30 days supply | Qty: 60 | Fill #5

## 2018-09-11 MED FILL — ATORVASTATIN 20 MG TABLET: 20 | 30 days supply | Qty: 30 | Fill #1

## 2018-09-11 MED FILL — ATENOLOL 50 MG TABLET: 50 | 30 days supply | Qty: 45 | Fill #5

## 2018-09-11 MED FILL — LISINOPRIL-HCTZ 20-25 MG TA: 20-25 | 30 days supply | Qty: 30 | Fill #5

## 2018-09-11 MED FILL — methIMAzole 10 MG TABS: 10 | 30 days supply | Qty: 60 | Fill #0

## 2018-10-18 MED FILL — ATORVASTATIN 20 MG TABLET: 20 | 30 days supply | Qty: 30 | Fill #2

## 2018-10-18 MED FILL — methIMAzole 10 MG TABS: 10 | 30 days supply | Qty: 60 | Fill #1

## 2018-10-19 MED FILL — LISINOPRIL-HCTZ 20-25 MG TA: 20-25 | 30 days supply | Qty: 30 | Fill #0

## 2018-10-19 MED FILL — ATENOLOL 50 MG TABLET: 50 | 30 days supply | Qty: 45 | Fill #0

## 2018-11-19 MED FILL — LISINOPRIL-HCTZ 20-25 MG TA: 20-25 | 30 days supply | Qty: 30 | Fill #1

## 2018-11-19 MED FILL — ATORVASTATIN 20 MG TABLET: 20 | 30 days supply | Qty: 30 | Fill #3

## 2018-11-19 MED FILL — ATENOLOL 50 MG TABLET: 50 | 30 days supply | Qty: 45 | Fill #1

## 2018-11-19 MED FILL — methIMAzole 10 MG TABS: 10 | 30 days supply | Qty: 60 | Fill #2

## 2018-12-18 ENCOUNTER — Other Ambulatory Visit: Payer: Self-pay | Admitting: Internal Medicine

## 2018-12-21 MED FILL — ATENOLOL 50 MG TABLET: 50 | 30 days supply | Qty: 45 | Fill #2

## 2018-12-21 MED FILL — methIMAzole 10 MG TABS: 10 | 30 days supply | Qty: 60 | Fill #3

## 2018-12-21 MED FILL — ATORVASTATIN 20 MG TABLET: 20 | 30 days supply | Qty: 30 | Fill #4

## 2018-12-21 MED FILL — LISINOPRIL-HCTZ 20-25 MG TA: 20-25 | 30 days supply | Qty: 30 | Fill #2

## 2019-01-03 ENCOUNTER — Other Ambulatory Visit: Payer: Self-pay | Admitting: Internal Medicine

## 2019-01-03 ENCOUNTER — Other Ambulatory Visit (HOSPITAL_COMMUNITY): Payer: Self-pay | Admitting: Internal Medicine

## 2019-01-03 DIAGNOSIS — R112 Nausea with vomiting, unspecified: Secondary | ICD-10-CM

## 2019-02-01 ENCOUNTER — Other Ambulatory Visit: Payer: Self-pay

## 2019-02-01 ENCOUNTER — Ambulatory Visit (HOSPITAL_COMMUNITY)
Admission: RE | Admit: 2019-02-01 | Discharge: 2019-02-01 | Disposition: A | Discharge status: Home / Self Care | Payer: BC Managed Care – PPO | Source: Ambulatory Visit | Attending: Internal Medicine | Admitting: Internal Medicine

## 2019-02-01 DIAGNOSIS — R112 Nausea with vomiting, unspecified: Secondary | ICD-10-CM | POA: Insufficient documentation

## 2019-02-01 MED ORDER — TECHNETIUM TC 99M SULFUR COLLOID
2.2000 | Freq: Once | INTRAVENOUS | Status: AC | PRN
  Administered 2019-02-01: 2.2 via INTRAVENOUS

## 2019-02-05 MED FILL — LISINOPRIL-HCTZ 20-25 MG TA: 20-25 | 30 days supply | Qty: 30 | Fill #3

## 2019-02-05 MED FILL — ATENOLOL 50 MG TABLET: 50 | 30 days supply | Qty: 45 | Fill #3

## 2019-02-14 MED FILL — ATORVASTATIN 20 MG TABLET: 20 | 30 days supply | Qty: 30 | Fill #0

## 2019-03-18 MED FILL — LISINOPRIL-HCTZ 20-25 MG TA: 20-25 | 30 days supply | Qty: 30 | Fill #4

## 2019-04-26 MED FILL — ATORVASTATIN 20 MG TABLET: 20 | 30 days supply | Qty: 30 | Fill #1

## 2019-04-26 MED FILL — LISINOPRIL-HCTZ 20-25 MG TA: 20-25 | 30 days supply | Qty: 30 | Fill #5

## 2019-04-26 MED FILL — ATENOLOL 50 MG TABLET: 50 | 30 days supply | Qty: 45 | Fill #4

## 2019-05-29 MED FILL — ATENOLOL 50 MG TABLET: 50 | 30 days supply | Qty: 45 | Fill #5

## 2019-05-29 MED FILL — ATORVASTATIN 20 MG TABLET: 20 | 30 days supply | Qty: 30 | Fill #2

## 2019-05-30 MED FILL — LISINOPRIL-HCTZ 20-25 MG TA: 20-25 | 30 days supply | Qty: 30 | Fill #0

## 2019-05-31 ENCOUNTER — Other Ambulatory Visit: Payer: Self-pay | Admitting: Internal Medicine

## 2019-05-31 DIAGNOSIS — Z1231 Encounter for screening mammogram for malignant neoplasm of breast: Secondary | ICD-10-CM

## 2019-06-12 ENCOUNTER — Encounter: Payer: Self-pay | Admitting: *Deleted

## 2019-06-25 ENCOUNTER — Ambulatory Visit (INDEPENDENT_AMBULATORY_CARE_PROVIDER_SITE_OTHER): Payer: BC Managed Care – PPO | Admitting: Student

## 2019-06-25 ENCOUNTER — Encounter: Payer: Self-pay | Admitting: Student

## 2019-06-25 ENCOUNTER — Other Ambulatory Visit: Payer: Self-pay

## 2019-06-25 VITALS — BP 124/74 | HR 90 | Wt 349.4 lb

## 2019-06-25 DIAGNOSIS — Z124 Encounter for screening for malignant neoplasm of cervix: Secondary | ICD-10-CM

## 2019-06-25 DIAGNOSIS — Z1231 Encounter for screening mammogram for malignant neoplasm of breast: Secondary | ICD-10-CM

## 2019-06-25 DIAGNOSIS — Z1151 Encounter for screening for human papillomavirus (HPV): Secondary | ICD-10-CM

## 2019-06-25 DIAGNOSIS — N898 Other specified noninflammatory disorders of vagina: Secondary | ICD-10-CM

## 2019-06-25 DIAGNOSIS — Z01419 Encounter for gynecological examination (general) (routine) without abnormal findings: Secondary | ICD-10-CM

## 2019-06-25 DIAGNOSIS — Z113 Encounter for screening for infections with a predominantly sexual mode of transmission: Secondary | ICD-10-CM

## 2019-06-25 NOTE — Progress Notes (Signed)
History:  [name] is a 38 y.o. G1P1 who presents to clinic today for well woman exam and complaints of on-going frequent discharge. She thinks she might have a yeast infection.   She sees a PCP for her diabetes management; HgbA1c is down from 10 to 7.1.   The following portions of the patient's history were reviewed and updated as appropriate: allergies, current medications, family history, past medical history, social history, past surgical history and problem list.  Review of Systems:  Review of Systems  Constitutional: Negative.   HENT: Negative.   Respiratory: Negative.   Cardiovascular: Negative.   Genitourinary: Negative.   Musculoskeletal: Negative.   Skin: Negative.   Neurological: Negative.   Psychiatric/Behavioral: Negative.       Objective:  Physical Exam BP 124/74   Pulse 90   Wt (!) 349 lb 6.4 oz (158.5 kg)   LMP 06/08/2019 (Approximate)   BMI 61.89 kg/m  Physical Exam  Constitutional: She appears well-developed.  HENT:  Head: Normocephalic.  Neck: Normal range of motion.  Respiratory: Effort normal.  GI: Soft. Bowel sounds are normal.  Genitourinary:    Genitourinary Comments: NEFG; copious white discharge in the vagina; no CMT, suprapubic or adnexal tenderness; cervix unable to be visualized but no tenderness on palpation.    Musculoskeletal: Normal range of motion.  Neurological: She is alert.  Skin: Skin is warm and dry.  Breast exam benign; no lumps, masses or tenderness.     Labs and Imaging No results found for this or any previous visit (from the past 24 hour(s)).  No results found.   Assessment & Plan:  1. Encounter for screening mammogram for malignant neoplasm of breast  - MM 3D SCREEN BREAST BILATERAL; Future  2. Well woman exam  - RPR - Hepatitis C Antibody - Hepatitis B Surface AntiGEN - HIV antibody - Cytology - PAP( [place]) - Cervicovaginal ancillary only( [place])   [place],  [place] 06/25/2019 5:36 PM

## 2019-06-26 LAB — HEPATITIS B SURFACE ANTIGEN: Hepatitis B Surface Ag: NEGATIVE

## 2019-06-26 LAB — RPR: RPR Ser Ql: NONREACTIVE

## 2019-06-26 LAB — HIV ANTIBODY (ROUTINE TESTING W REFLEX): HIV Screen 4th Generation wRfx: NONREACTIVE

## 2019-06-26 LAB — HEPATITIS C ANTIBODY: Hep C Virus Ab: <0.1 s/co ratio (ref 0.0–0.9)

## 2019-06-27 LAB — CERVICOVAGINAL ANCILLARY ONLY
Bacterial Vaginitis (gardnerella): NEGATIVE
Candida Glabrata: NEGATIVE
Candida Vaginitis: NEGATIVE
Chlamydia: NEGATIVE
Comment: NEGATIVE
Comment: NEGATIVE
Comment: NEGATIVE
Comment: NEGATIVE
Comment: NEGATIVE
Comment: NORMAL
Neisseria Gonorrhea: NEGATIVE
Trichomonas: NEGATIVE

## 2019-06-28 LAB — CYTOLOGY - PAP
Chlamydia: NEGATIVE
Comment: NEGATIVE
Comment: NEGATIVE
Comment: NEGATIVE
Comment: NORMAL
Diagnosis: NEGATIVE
High risk HPV: NEGATIVE
Neisseria Gonorrhea: NEGATIVE
Trichomonas: NEGATIVE

## 2019-07-05 ENCOUNTER — Telehealth (HOSPITAL_COMMUNITY): Payer: Self-pay | Admitting: Student

## 2019-07-05 ENCOUNTER — Other Ambulatory Visit (HOSPITAL_COMMUNITY): Payer: Self-pay | Admitting: Student

## 2019-07-05 MED ORDER — NORETHINDRONE 0.35 MG PO TABS: 1.0000 | ORAL_TABLET | Freq: Every day | ORAL | 11 refills | Status: DC

## 2019-07-05 NOTE — Telephone Encounter (Signed)
Called patient and informed her of the negative results for her STIs and her pap; she would like birth control pills. Due to her high blood pressure, she is amenable to using POPS.

## 2019-07-10 MED FILL — ATORVASTATIN 20 MG TABLET: 20 | 30 days supply | Qty: 30 | Fill #3

## 2019-07-10 MED FILL — LISINOPRIL-HCTZ 20-25 MG TA: 20-25 | 30 days supply | Qty: 30 | Fill #1

## 2019-07-19 MED FILL — ATENOLOL 50 MG TABS: 50 | 30 days supply | Qty: 45 | Fill #0

## 2019-08-08 MED FILL — LISINOPRIL-HCTZ 20-25 MG TA: 20-25 | 30 days supply | Qty: 30 | Fill #2

## 2019-08-08 MED FILL — ATORVASTATIN 20 MG TABLET: 20 | 30 days supply | Qty: 30 | Fill #4

## 2019-08-08 MED FILL — ATENOLOL 50 MG TABS: 50 | 30 days supply | Qty: 45 | Fill #0

## 2019-09-10 MED FILL — ATENOLOL 50 MG TABLET: 50 | 30 days supply | Qty: 45 | Fill #1

## 2019-09-10 MED FILL — LISINOPRIL-HCTZ 20-25 MG TA: 20-25 | 30 days supply | Qty: 30 | Fill #3

## 2019-09-10 MED FILL — ATORVASTATIN 20 MG TABLET: 20 | 30 days supply | Qty: 30 | Fill #5

## 2019-10-11 MED FILL — ATENOLOL 50 MG TABLET: 50 | 30 days supply | Qty: 45 | Fill #2

## 2019-10-11 MED FILL — LISINOPRIL-HYDROCHLOROTHIAZ: 20-25 | 30 days supply | Qty: 30 | Fill #4

## 2019-10-15 MED FILL — ATORVASTATIN 20 MG TABLET: 20 | 30 days supply | Qty: 30 | Fill #0

## 2019-10-17 MED FILL — ATORVASTATIN 40 MG TABLET: 40 | 30 days supply | Qty: 30 | Fill #0

## 2019-10-31 ENCOUNTER — Encounter: Payer: BC Managed Care – PPO | Attending: Internal Medicine | Admitting: Registered"

## 2019-10-31 ENCOUNTER — Other Ambulatory Visit: Payer: Self-pay

## 2019-10-31 ENCOUNTER — Encounter: Payer: Self-pay | Admitting: Registered"

## 2019-10-31 DIAGNOSIS — E119 Type 2 diabetes mellitus without complications: Secondary | ICD-10-CM | POA: Diagnosis not present

## 2019-10-31 NOTE — Patient Instructions (Addendum)
Try to get in a little bit of food in the morning, including protein.   Consider trying a smaller Red Bull or sugar free Monster in the morning.  Add some protein to your snacks! Nuts, seeds, nut butters  Discuss your appetite and GI symptoms with your primary care provider.  Continue to listen to your body! Honor your hunger and fullness cues.  Continue to keep an eye on your carbohydrates and work to eat them consistently throughout the day.

## 2019-10-31 NOTE — Progress Notes (Signed)
Diabetes Self-Management Education  Visit Type: First/Initial  Appt. Start Time: 8:15  Appt. End Time: 9:30  10/31/2019  [name], identified by name and date of birth, is a 39 y.o. female with a diagnosis of Diabetes: Type 2.   ASSESSMENT  Weight (!) 350 lb 9.6 oz (159 kg). Body mass index is 62.11 kg/m.   Pt expectations from appointment are education and strategies to use nutrition to help blood sugar levels.  Pt states she has had previous education (monthly individual classes) for about 3 years. Nutrition was discussed, carbohydrate counting, vegetables, protein. Pt reports having stomach issues, has a lack of appetite. Pt attributes this to medications. Pt states that this has kept her from having breakfast. Pt reports lack of appetite and would keep blood sugar up with juices. Pt would not eat during the day only at dinner time.  Pt reports varying hyper and hypothyroidism. 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 Pt reports drinking energy drinks, one to start her day. Monster or Red Bull, 16 oz or 12 oz Pt works second shift 3-11 pm. Pt reports waking up around 9-10 am Pt reports not being a breakfast person. On work days, wake up around 11-12 noon. Would eat by 2 pm. Snack while working (chex mix, chips, pork skins, apples, yogurt, outshine popsicles) Eats a meal at 8 o'clock, then may have a little something Pt reports gas when consuming dairy, possible lactose intolerance.  Diabetes Self-Management Education - 10/31/19 0830      Visit Information   Visit Type  First/Initial      Initial Visit   Diabetes Type  Type 2    Are you currently following a meal plan?  No    Are you taking your medications as prescribed?  Yes    Date Diagnosed  2001      Health Coping   How would you rate your overall health?  Fair      Psychosocial Assessment   Patient Belief/Attitude about Diabetes  Motivated to manage  diabetes    Self-care barriers  None    Self-management support  Family    Patient Concerns  Nutrition/Meal planning;Weight Control    Special Needs  None    Preferred Learning Style  No preference indicated    Learning Readiness  Ready    How often do you need to have someone help you when you read instructions, pamphlets, or other written materials from your doctor or pharmacy?  2 - Rarely    What is the last grade level you completed in school?  some college      Pre-Education Assessment   Patient understands the diabetes disease and treatment process.  Demonstrates understanding / competency    Patient understands incorporating nutritional management into lifestyle.  Needs Review    Patient undertands incorporating physical activity into lifestyle.  Needs Review    Patient understands using medications safely.  Demonstrates understanding / competency    Patient understands monitoring blood glucose, interpreting and using results  Demonstrates understanding / competency    Patient understands prevention, detection, and treatment of acute complications.  Demonstrates understanding / competency    Patient understands how to develop strategies to address psychosocial issues.  Needs Review    Patient understands how to develop strategies to promote health/change behavior.  Needs Review      Complications   Last HgB A1C per patient/outside source  9.2 %  How often do you check your blood sugar?  1-2 times/day    Fasting Blood glucose range (mg/dL)  >381    Number of hypoglycemic episodes per month  0    Number of hyperglycemic episodes per week  7    Can you tell when your blood sugar is high?  Yes    Have you had a dilated eye exam in the past 12 months?  Yes    Have you had a dental exam in the past 12 months?  No    Are you checking your feet?  Yes    How many days per week are you checking your feet?  7      Dietary Intake   Breakfast  skipped    Lunch  ham sandwich around 2:30  pm    Dinner  spaghetti, meat sauce with garlic, onion, peppers. Around 7-7:30 pm    Beverage(s)  crystal light, not much water.      Exercise   Exercise Type  ADL's      Patient Education   Previous Diabetes Education  Yes (please comment)    Disease state   Definition of diabetes, type 1 and 2, and the diagnosis of diabetes    Nutrition management   Meal timing in regards to the patients' current diabetes medication.;Role of diet in the treatment of diabetes and the relationship between the three main macronutrients and blood glucose level    Medications  Reviewed patients medication for diabetes, action, purpose, timing of dose and side effects.;Other (comment)   Suggest pt continue to work with PCP to find medication that works for her.   Monitoring  Purpose and frequency of SMBG.    Chronic complications  Retinopathy and reason for yearly dilated eye exams    Personal strategies to promote health  Lifestyle issues that need to be addressed for better diabetes care      Individualized Goals (developed by patient)   Nutrition  Follow meal plan discussed    Physical Activity  Not Applicable    Medications  take my medication as prescribed    Monitoring   test my blood glucose as discussed    Reducing Risk  do foot checks daily    Health Coping  ask for help with (comment)   Finding the right medications with your PCP     Post-Education Assessment   Patient understands the diabetes disease and treatment process.  Demonstrates understanding / competency    Patient understands incorporating nutritional management into lifestyle.  Demonstrates understanding / competency    Patient undertands incorporating physical activity into lifestyle.  Needs Review    Patient understands using medications safely.  Demonstrates understanding / competency    Patient understands monitoring blood glucose, interpreting and using results  Needs Review    Patient understands prevention, detection, and  treatment of acute complications.  Demonstrates understanding / competency    Patient understands prevention, detection, and treatment of chronic complications.  Demonstrates understanding / competency    Patient understands how to develop strategies to address psychosocial issues.  Needs Review    Patient understands how to develop strategies to promote health/change behavior.  Needs Review      Outcomes   Expected Outcomes  Demonstrated interest in learning. Expect positive outcomes    Future DMSE  4-6 wks    Program Status  Not Completed       Individualized Plan for Diabetes Self-Management Training:   Learning Objective:  Patient will have  a greater understanding of diabetes self-management. Patient education plan is to attend individual and/or group sessions per assessed needs and concerns.   Patient Instructions  Try to get in a little bit of food in the morning, including protein.   Consider trying a smaller Red Bull or sugar free Monster in the morning.  Add some protein to your snacks! Nuts, seeds, nut butters  Discuss your appetite and GI symptoms with your primary care provider.  Continue to listen to your body! Honor your hunger and fullness cues.  Continue to keep an eye on your carbohydrates and work to eat them consistently throughout the day.   Expected Outcomes:  Demonstrated interest in learning. Expect positive outcomes  Education material provided: none  If problems or questions, patient to contact team via:  Phone and Email  Future DSME appointment: 4-6 wks

## 2019-11-27 MED FILL — LISINOPRIL-HYDROCHLOROTHIAZ: 20-25 | 30 days supply | Qty: 30 | Fill #5

## 2019-11-27 MED FILL — ATENOLOL 50 MG TABLET: 50 | 30 days supply | Qty: 45 | Fill #3

## 2019-11-27 MED FILL — ATORVASTATIN 40 MG TABLET: 40 | 30 days supply | Qty: 30 | Fill #1

## 2019-12-10 ENCOUNTER — Ambulatory Visit: Payer: BC Managed Care – PPO | Admitting: Registered"

## 2020-01-02 MED FILL — ATORVASTATIN CALCIUM 40 MG: 40 | 30 days supply | Qty: 30 | Fill #2

## 2020-01-02 MED FILL — ATENOLOL 50 MG TABLET: 50 | 30 days supply | Qty: 45 | Fill #4

## 2020-02-03 MED FILL — ATORVASTATIN CALCIUM 40 MG: 40 | 30 days supply | Qty: 30 | Fill #3

## 2020-08-01 ENCOUNTER — Other Ambulatory Visit: Payer: Self-pay

## 2020-08-01 ENCOUNTER — Encounter (HOSPITAL_BASED_OUTPATIENT_CLINIC_OR_DEPARTMENT_OTHER): Payer: Self-pay | Admitting: Emergency Medicine

## 2020-08-01 ENCOUNTER — Emergency Department (HOSPITAL_BASED_OUTPATIENT_CLINIC_OR_DEPARTMENT_OTHER)
Admission: EM | Admit: 2020-08-01 | Discharge: 2020-08-01 | Disposition: A | Discharge status: Home / Self Care | Payer: BC Managed Care – PPO | Attending: Emergency Medicine | Admitting: Emergency Medicine

## 2020-08-01 DIAGNOSIS — Z79899 Other long term (current) drug therapy: Secondary | ICD-10-CM | POA: Diagnosis not present

## 2020-08-01 DIAGNOSIS — Z794 Long term (current) use of insulin: Secondary | ICD-10-CM | POA: Insufficient documentation

## 2020-08-01 DIAGNOSIS — Z7984 Long term (current) use of oral hypoglycemic drugs: Secondary | ICD-10-CM | POA: Insufficient documentation

## 2020-08-01 DIAGNOSIS — I10 Essential (primary) hypertension: Secondary | ICD-10-CM | POA: Insufficient documentation

## 2020-08-01 DIAGNOSIS — M542 Cervicalgia: Secondary | ICD-10-CM | POA: Diagnosis present

## 2020-08-01 DIAGNOSIS — M436 Torticollis: Secondary | ICD-10-CM | POA: Diagnosis not present

## 2020-08-01 DIAGNOSIS — E119 Type 2 diabetes mellitus without complications: Secondary | ICD-10-CM | POA: Insufficient documentation

## 2020-08-01 MED ORDER — DICLOFENAC SODIUM 75 MG PO TBEC: 75.0000 mg | DELAYED_RELEASE_TABLET | Freq: Two times a day (BID) | ORAL | 0 refills | Status: DC

## 2020-08-01 MED ORDER — METHOCARBAMOL 500 MG PO TABS: 500.0000 mg | ORAL_TABLET | Freq: Four times a day (QID) | ORAL | 0 refills | Status: DC

## 2020-08-01 NOTE — ED Triage Notes (Signed)
Pt reports muscles spasms to R side of neck and R shoulder since last night.

## 2020-08-01 NOTE — ED Provider Notes (Signed)
[place] EMERGENCY DEPARTMENT Provider Note   CSN: [id number] Arrival date & time: 08/01/20  1030     History Chief Complaint  Patient presents with  . Neck Pain    Rhonda Baldwin is a 39 y.o. female.  The history is provided by the patient. No language interpreter was used.  Neck Pain Pain location:  Generalized neck Pain radiates to:  R shoulder Pain severity:  Moderate Onset quality:  Gradual Duration:  1 day Progression:  Worsening Chronicity:  New Relieved by:  Nothing Worsened by:  Nothing Ineffective treatments:  None tried Associated symptoms: no numbness   Risk factors: no recurrent falls    Pt complains of muscle spasm to the right side of her neck and shoulder     Past Medical History:  Diagnosis Date  . Diabetes mellitus   . Hypertension   . Thyroid disease     Patient Active Problem List   Diagnosis Date Noted  . Menorrhagia, premenopausal 12/15/2014  . Need for prophylactic vaccination and inoculation against influenza 04/17/2014  . Essential hypertension 04/17/2014  . Diabetes ([place]) 11/28/2013  . Obesity, unspecified 05/03/2013  . Primary hyperthyroidism 08/19/2012  . Pneumonia 08/17/2012  . Nausea vomiting and diarrhea 08/17/2012  . Gastritis 08/17/2012  . HTN (hypertension) 08/17/2012  . DM (diabetes mellitus) ([place]) 08/17/2012    Past Surgical History:  Procedure Laterality Date  . CESAREAN SECTION    . CHOLECYSTECTOMY       OB History    Gravida  1   Para  1   Term      Preterm      AB      Living  1     SAB      IAB      Ectopic      Multiple      Live Births  1           Family History  Problem Relation Age of Onset  . Hypertension Mother   . Hypertension Father     Social History   Tobacco Use  . Smoking status: Never Smoker  . Smokeless tobacco: Never Used  Vaping Use  . Vaping Use: Never used  Substance Use Topics  . Alcohol use: No  . Drug use: No    Home  Medications Prior to Admission medications   Medication Sig Start Date End Date Taking? Authorizing Provider  atenolol (TENORMIN) 50 MG tablet Take 1.5 tablets (75 mg total) daily by mouth. 07/02/17   Malvin Johns, MD  atorvastatin (LIPITOR) 20 MG tablet Take 1 tablet (20 mg total) daily by mouth. 07/02/17   Malvin Johns, MD  glucose monitoring kit (FREESTYLE) monitoring kit 1 each by Does not apply route as needed for other. 08/23/13   Reyne Dumas, MD  ibuprofen (ADVIL,MOTRIN) 800 MG tablet Take 1 tablet (800 mg total) by mouth 3 (three) times daily. 02/23/17   Palumbo, April, MD  insulin NPH-regular Human (HUMULIN 70/30) (70-30) 100 UNIT/ML injection Inject 35 Units into the skin daily with breakfast. 35 units with breakfast; 45 units bedtime    [provider]  insulin NPH-regular Human (NOVOLIN 70/30) (70-30) 100 UNIT/ML injection Inject 25 Units 2 (two) times daily with a meal into the skin. Patient not taking: Reported on 06/25/2019 07/02/17   Malvin Johns, MD  Lancets (FREESTYLE) lancets Use as instructed 01/19/15   Tresa Garter, MD  lisinopril-hydrochlorothiazide (PRINZIDE,ZESTORETIC) 20-25 MG tablet Take 1 tablet daily by mouth. 07/02/17  Malvin Johns, MD  loratadine (CLARITIN) 10 MG tablet Take 1 tablet (10 mg total) by mouth daily as needed for allergies. 12/15/14   Tresa Garter, MD  metFORMIN (GLUCOPHAGE) 1000 MG tablet Take 1 tablet (1,000 mg total) 2 (two) times daily with a meal by mouth. Patient not taking: Reported on 10/31/2019 07/02/17   Malvin Johns, MD  methimazole (TAPAZOLE) 10 MG tablet Take 2 tablets (20 mg total) 2 (two) times daily by mouth. 07/02/17   Malvin Johns, MD  naproxen sodium (ANAPROX) 220 MG tablet Take 440 mg by mouth 2 (two) times daily with a meal. Reported on 08/11/2015    [provider]  norethindrone (ORTHO MICRONOR) 0.35 MG tablet Take 1 tablet (0.35 mg total) by mouth daily. 07/05/19 07/04/20  Starr Lake, CNM  TRUEPLUS INSULIN SYRINGE 30G X 5/16" 0.5 ML MISC USE AS DIRECTED WITH INSULIN 02/13/15   Tresa Garter, MD    Allergies    Orange  Review of Systems   Review of Systems  Musculoskeletal: Positive for neck pain.  Neurological: Negative for numbness.  All other systems reviewed and are negative.   Physical Exam Updated Vital Signs BP (!) 144/91 (BP Location: Left Arm)   Pulse 79   Temp 98.1 F (36.7 C) (Oral)   Resp 20   Ht 5' 3" (1.6 m)   Wt (!) 159 kg   LMP 07/15/2020   SpO2 99%   BMI 62.09 kg/m   Physical Exam Vitals and nursing note reviewed.  Constitutional:      Appearance: She is well-developed and well-nourished.  HENT:     Head: Normocephalic.  Eyes:     Extraocular Movements: EOM normal.  Neck:     Comments: Pain with range of motion  nv and ns intact., tender trapezius  Pulmonary:     Effort: Pulmonary effort is normal.  Abdominal:     General: There is no distension.  Musculoskeletal:        General: Normal range of motion.     Cervical back: Tenderness present.  Neurological:     Mental Status: She is alert and oriented to person, place, and time.  Psychiatric:        Mood and Affect: Mood and affect normal.     ED Results / Procedures / Treatments   Labs (all labs ordered are listed, but only abnormal results are displayed) Labs Reviewed - No data to display  EKG None  Radiology No results found.  Procedures Procedures (including critical care time)  Medications Ordered in ED Medications - No data to display  ED Course  I have reviewed the triage vital signs and the nursing notes.  Pertinent labs & imaging results that were available during my care of the patient were reviewed by me and considered in my medical decision making (see chart for details).    MDM Rules/Calculators/A&P                          Pt counseled on muscle spasm.  Pt given Rx for voltaren and robaxin Final Clinical Impression(s) / ED  Diagnoses Final diagnoses:  Torticollis    Rx / DC Orders ED Discharge Orders         Ordered    diclofenac (VOLTAREN) 75 MG EC tablet  2 times daily        08/01/20 1150    methocarbamol (ROBAXIN) 500 MG tablet  4 times daily  08/01/20 1150        An After Visit Summary was printed and given to the patient.    Sidney Ace 08/01/20 1151    Mesner, Corene Cornea, MD 08/01/20 1209

## 2020-08-01 NOTE — ED Notes (Signed)
Pain to right shoulder and stiffness to her right neck and posterior neck.  Denies injury.

## 2021-01-29 ENCOUNTER — Other Ambulatory Visit: Payer: Self-pay | Admitting: Internal Medicine

## 2021-01-29 DIAGNOSIS — Z1231 Encounter for screening mammogram for malignant neoplasm of breast: Secondary | ICD-10-CM

## 2021-02-02 ENCOUNTER — Other Ambulatory Visit (HOSPITAL_BASED_OUTPATIENT_CLINIC_OR_DEPARTMENT_OTHER): Payer: Self-pay

## 2021-02-02 DIAGNOSIS — G4733 Obstructive sleep apnea (adult) (pediatric): Secondary | ICD-10-CM

## 2021-03-25 ENCOUNTER — Ambulatory Visit
Admission: RE | Admit: 2021-03-25 | Discharge: 2021-03-25 | Disposition: A | Discharge status: Home / Self Care | Payer: BC Managed Care – PPO | Source: Ambulatory Visit | Attending: Internal Medicine | Admitting: Internal Medicine

## 2021-03-25 ENCOUNTER — Other Ambulatory Visit: Payer: Self-pay

## 2021-03-25 DIAGNOSIS — Z1231 Encounter for screening mammogram for malignant neoplasm of breast: Secondary | ICD-10-CM

## 2021-11-03 DIAGNOSIS — B3731 Acute candidiasis of vulva and vagina: Secondary | ICD-10-CM | POA: Diagnosis not present

## 2021-11-03 DIAGNOSIS — I10 Essential (primary) hypertension: Secondary | ICD-10-CM | POA: Diagnosis not present

## 2021-11-03 DIAGNOSIS — R8290 Unspecified abnormal findings in urine: Secondary | ICD-10-CM | POA: Diagnosis not present

## 2021-11-03 DIAGNOSIS — J302 Other seasonal allergic rhinitis: Secondary | ICD-10-CM | POA: Diagnosis not present

## 2021-11-03 DIAGNOSIS — E1165 Type 2 diabetes mellitus with hyperglycemia: Secondary | ICD-10-CM | POA: Diagnosis not present

## 2021-11-03 DIAGNOSIS — E782 Mixed hyperlipidemia: Secondary | ICD-10-CM | POA: Diagnosis not present

## 2021-11-03 DIAGNOSIS — G4733 Obstructive sleep apnea (adult) (pediatric): Secondary | ICD-10-CM | POA: Diagnosis not present

## 2021-11-03 DIAGNOSIS — E0590 Thyrotoxicosis, unspecified without thyrotoxic crisis or storm: Secondary | ICD-10-CM | POA: Diagnosis not present

## 2021-12-18 IMAGING — MG MM DIGITAL SCREENING BILAT W/ TOMO AND CAD
6 of 10 series · 6 of 30 positions shown · non-contrast
Comparison: Previous exam(s).

ACR Breast Density Category a: The breast tissue is almost entirely
fatty.

CLINICAL DATA: Screening.

EXAM:
DIGITAL SCREENING BILATERAL MAMMOGRAM WITH TOMOSYNTHESIS AND CAD
TECHNIQUE: Bilateral screening digital craniocaudal and mediolateral oblique
mammograms were obtained. Bilateral screening digital breast
tomosynthesis was performed. The images were evaluated with
computer-aided detection.

[L CV synth-2D]
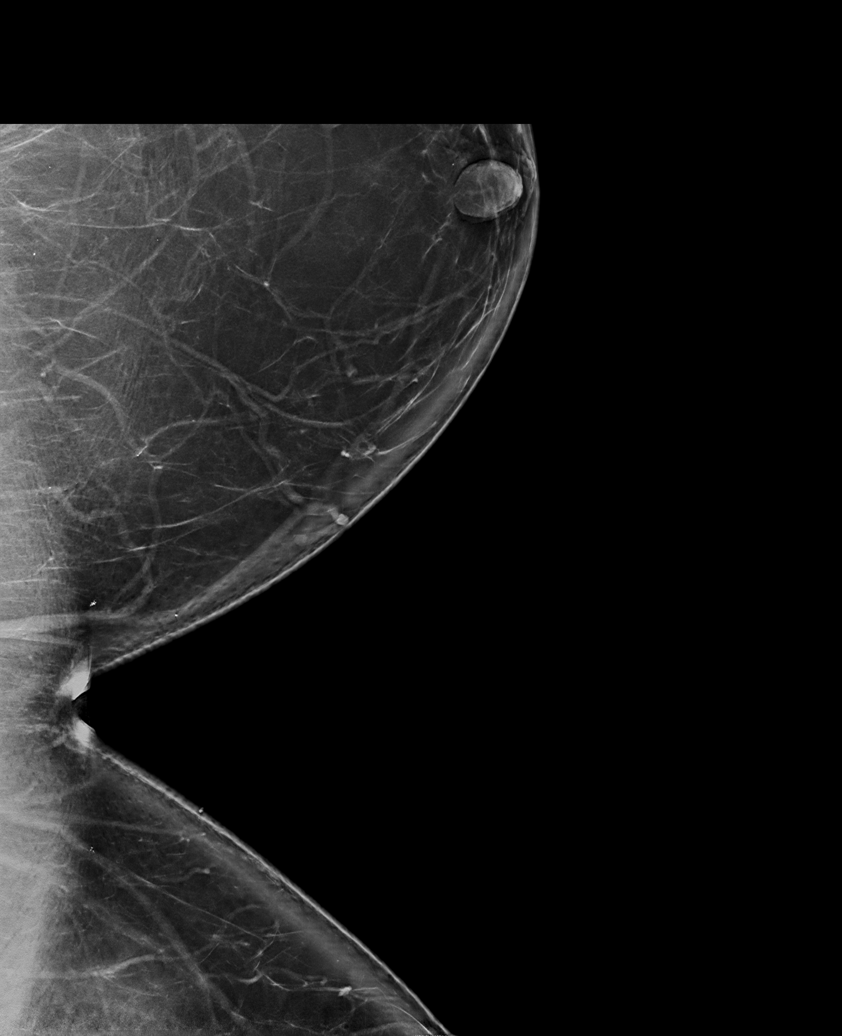

[R MLO synth-2D]
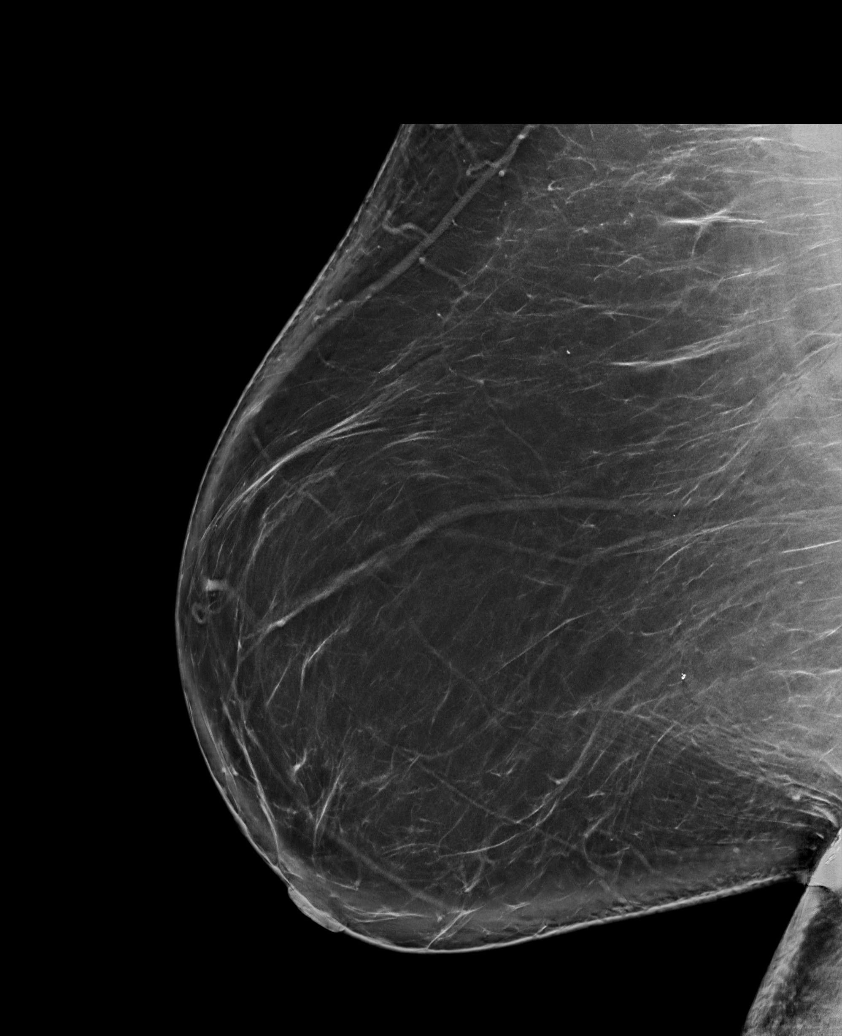

[L CC synth-2D]
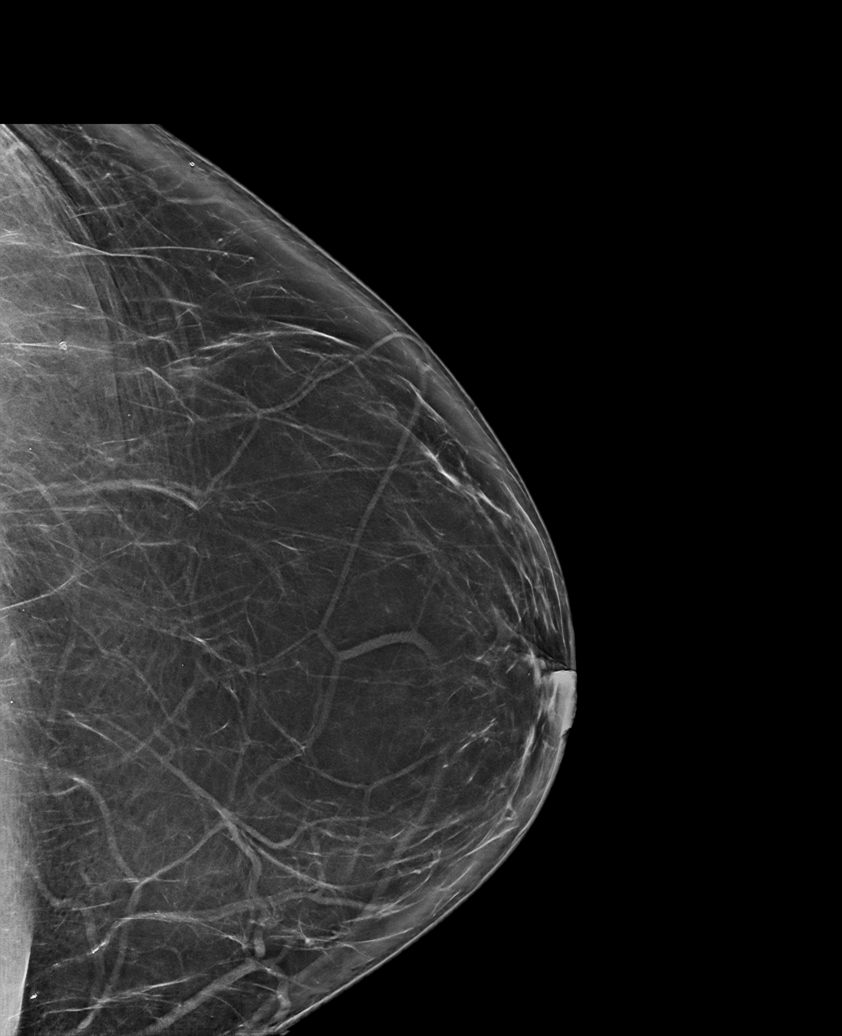

[R CC synth-2D]
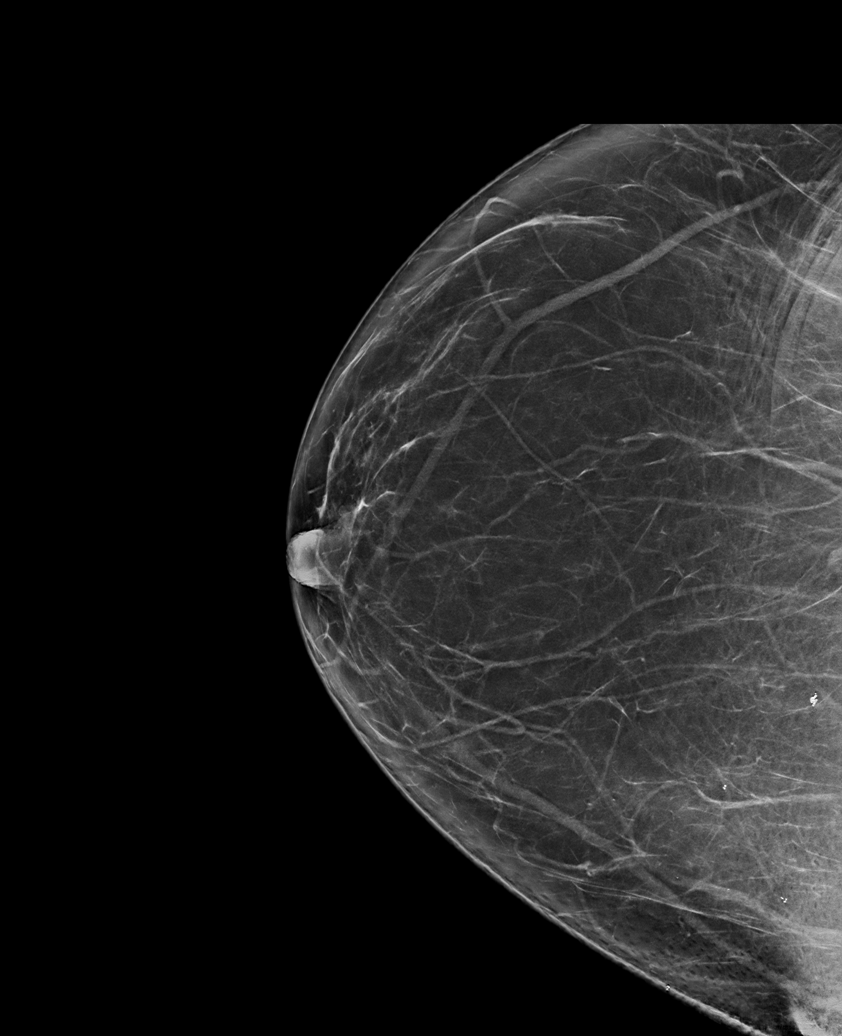

[L MLO synth-2D]
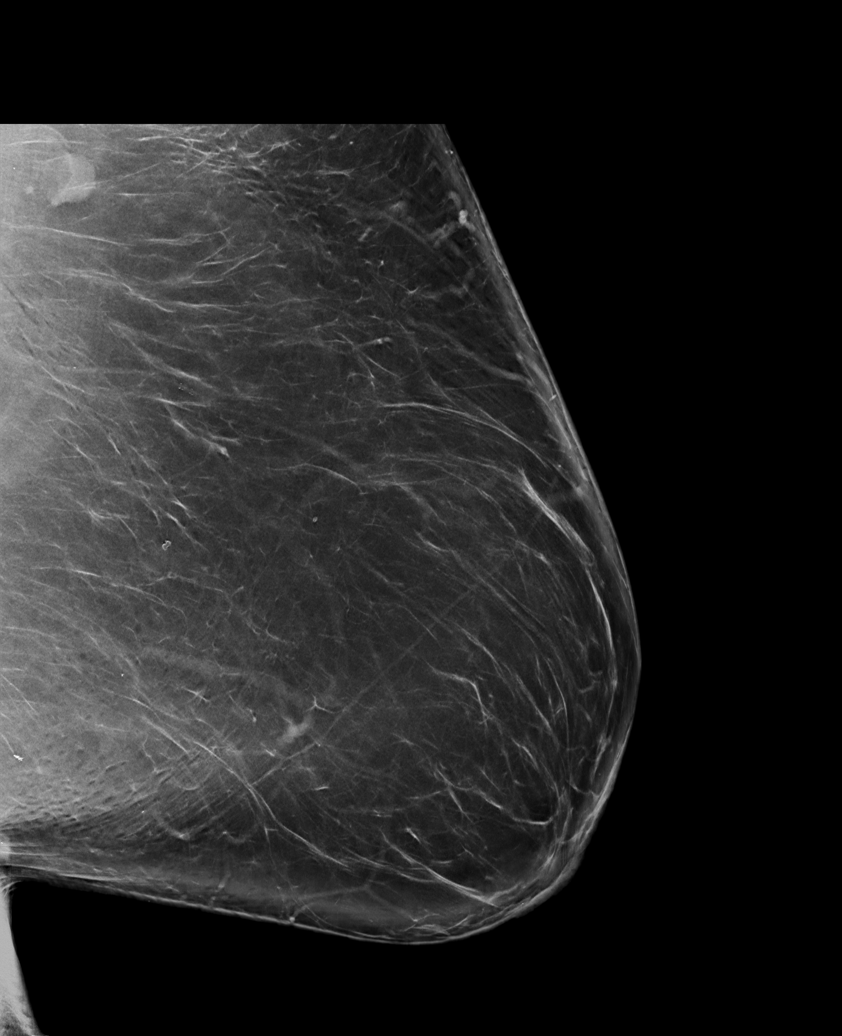

[L CV tomo · tomo slice 49/97.0]
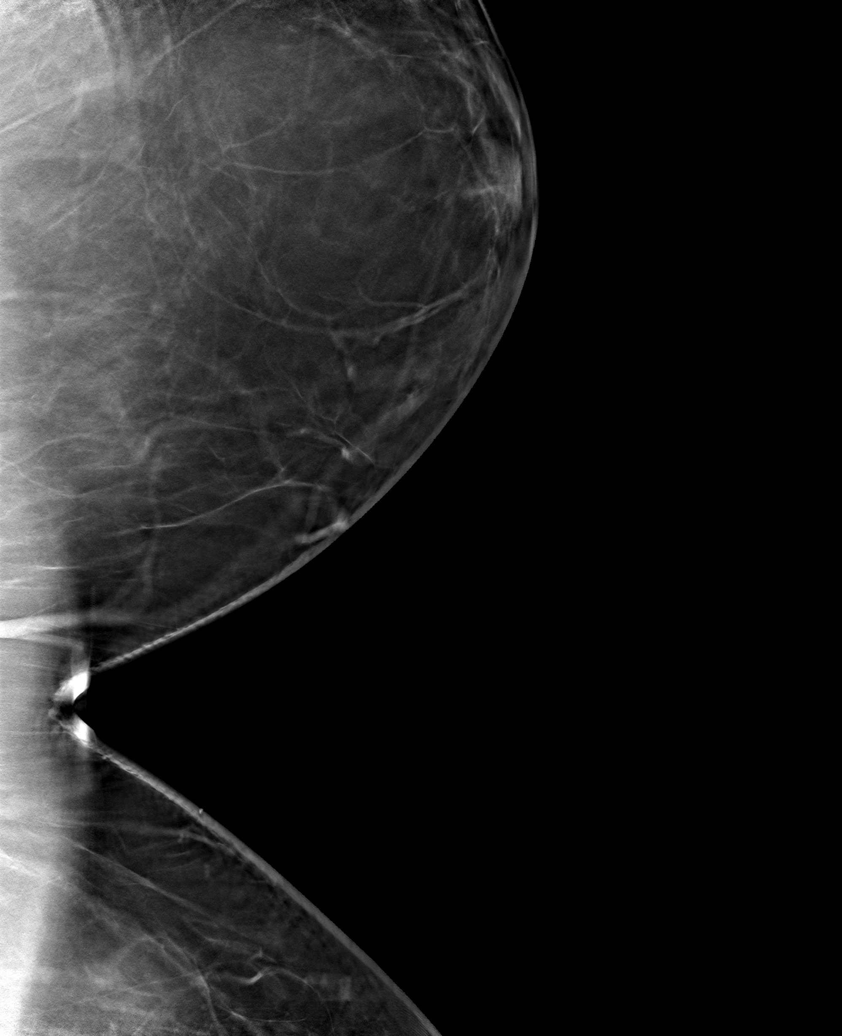

[6 of 30 positions shown; findings below may reference images not displayed]

FINDINGS: There are no findings suspicious for malignancy.
IMPRESSION: No mammographic evidence of malignancy. A result letter of this
screening mammogram will be mailed directly to the patient.

RECOMMENDATION:
Screening mammogram in one year. (Code:0E-3-N98)

BI-RADS CATEGORY  1: Negative.

## 2022-01-20 DIAGNOSIS — I10 Essential (primary) hypertension: Secondary | ICD-10-CM | POA: Diagnosis not present

## 2022-01-20 DIAGNOSIS — R8290 Unspecified abnormal findings in urine: Secondary | ICD-10-CM | POA: Diagnosis not present

## 2022-01-20 DIAGNOSIS — E1165 Type 2 diabetes mellitus with hyperglycemia: Secondary | ICD-10-CM | POA: Diagnosis not present

## 2022-01-20 DIAGNOSIS — E782 Mixed hyperlipidemia: Secondary | ICD-10-CM | POA: Diagnosis not present

## 2022-02-04 DIAGNOSIS — E1142 Type 2 diabetes mellitus with diabetic polyneuropathy: Secondary | ICD-10-CM | POA: Diagnosis not present

## 2022-02-04 DIAGNOSIS — Z23 Encounter for immunization: Secondary | ICD-10-CM | POA: Diagnosis not present

## 2022-02-04 DIAGNOSIS — Z1239 Encounter for other screening for malignant neoplasm of breast: Secondary | ICD-10-CM | POA: Diagnosis not present

## 2022-02-04 DIAGNOSIS — Z0001 Encounter for general adult medical examination with abnormal findings: Secondary | ICD-10-CM | POA: Diagnosis not present

## 2022-02-04 DIAGNOSIS — E611 Iron deficiency: Secondary | ICD-10-CM | POA: Diagnosis not present

## 2022-02-04 DIAGNOSIS — E1165 Type 2 diabetes mellitus with hyperglycemia: Secondary | ICD-10-CM | POA: Diagnosis not present

## 2022-02-04 DIAGNOSIS — Z124 Encounter for screening for malignant neoplasm of cervix: Secondary | ICD-10-CM | POA: Diagnosis not present

## 2022-02-04 DIAGNOSIS — I10 Essential (primary) hypertension: Secondary | ICD-10-CM | POA: Diagnosis not present

## 2022-02-04 DIAGNOSIS — G4733 Obstructive sleep apnea (adult) (pediatric): Secondary | ICD-10-CM | POA: Diagnosis not present

## 2022-02-04 DIAGNOSIS — E782 Mixed hyperlipidemia: Secondary | ICD-10-CM | POA: Diagnosis not present

## 2022-02-04 DIAGNOSIS — I739 Peripheral vascular disease, unspecified: Secondary | ICD-10-CM | POA: Diagnosis not present

## 2022-02-24 ENCOUNTER — Other Ambulatory Visit (HOSPITAL_COMMUNITY): Payer: Self-pay | Admitting: Family Medicine

## 2022-02-24 DIAGNOSIS — I739 Peripheral vascular disease, unspecified: Secondary | ICD-10-CM

## 2022-02-25 ENCOUNTER — Ambulatory Visit (HOSPITAL_COMMUNITY)
Admission: RE | Admit: 2022-02-25 | Discharge: 2022-02-25 | Disposition: A | Discharge status: Home / Self Care | Payer: 59 | Source: Ambulatory Visit | Attending: Cardiovascular Disease | Admitting: Cardiovascular Disease

## 2022-02-25 DIAGNOSIS — I739 Peripheral vascular disease, unspecified: Secondary | ICD-10-CM | POA: Diagnosis not present

## 2022-04-11 DIAGNOSIS — R059 Cough, unspecified: Secondary | ICD-10-CM | POA: Diagnosis not present

## 2022-04-11 DIAGNOSIS — Z20822 Contact with and (suspected) exposure to covid-19: Secondary | ICD-10-CM | POA: Diagnosis not present

## 2022-04-11 DIAGNOSIS — R5381 Other malaise: Secondary | ICD-10-CM | POA: Diagnosis not present

## 2022-04-11 DIAGNOSIS — R5383 Other fatigue: Secondary | ICD-10-CM | POA: Diagnosis not present

## 2022-04-11 DIAGNOSIS — M791 Myalgia, unspecified site: Secondary | ICD-10-CM | POA: Diagnosis not present

## 2022-05-02 DIAGNOSIS — E782 Mixed hyperlipidemia: Secondary | ICD-10-CM | POA: Diagnosis not present

## 2022-05-02 DIAGNOSIS — Z23 Encounter for immunization: Secondary | ICD-10-CM | POA: Diagnosis not present

## 2022-05-02 DIAGNOSIS — J302 Other seasonal allergic rhinitis: Secondary | ICD-10-CM | POA: Diagnosis not present

## 2022-05-02 DIAGNOSIS — E1165 Type 2 diabetes mellitus with hyperglycemia: Secondary | ICD-10-CM | POA: Diagnosis not present

## 2022-05-02 DIAGNOSIS — I10 Essential (primary) hypertension: Secondary | ICD-10-CM | POA: Diagnosis not present

## 2022-05-02 DIAGNOSIS — E611 Iron deficiency: Secondary | ICD-10-CM | POA: Diagnosis not present

## 2022-05-02 DIAGNOSIS — I739 Peripheral vascular disease, unspecified: Secondary | ICD-10-CM | POA: Diagnosis not present

## 2022-05-02 DIAGNOSIS — M545 Low back pain, unspecified: Secondary | ICD-10-CM | POA: Diagnosis not present

## 2022-05-02 DIAGNOSIS — E039 Hypothyroidism, unspecified: Secondary | ICD-10-CM | POA: Diagnosis not present

## 2022-05-02 DIAGNOSIS — N920 Excessive and frequent menstruation with regular cycle: Secondary | ICD-10-CM | POA: Diagnosis not present

## 2022-05-02 DIAGNOSIS — E1142 Type 2 diabetes mellitus with diabetic polyneuropathy: Secondary | ICD-10-CM | POA: Diagnosis not present

## 2022-08-05 DIAGNOSIS — I739 Peripheral vascular disease, unspecified: Secondary | ICD-10-CM | POA: Diagnosis not present

## 2022-08-05 DIAGNOSIS — I10 Essential (primary) hypertension: Secondary | ICD-10-CM | POA: Diagnosis not present

## 2022-08-05 DIAGNOSIS — E782 Mixed hyperlipidemia: Secondary | ICD-10-CM | POA: Diagnosis not present

## 2022-08-05 DIAGNOSIS — E1165 Type 2 diabetes mellitus with hyperglycemia: Secondary | ICD-10-CM | POA: Diagnosis not present

## 2022-08-05 DIAGNOSIS — E1142 Type 2 diabetes mellitus with diabetic polyneuropathy: Secondary | ICD-10-CM | POA: Diagnosis not present

## 2022-08-05 DIAGNOSIS — J302 Other seasonal allergic rhinitis: Secondary | ICD-10-CM | POA: Diagnosis not present

## 2022-08-05 DIAGNOSIS — E611 Iron deficiency: Secondary | ICD-10-CM | POA: Diagnosis not present

## 2022-08-05 DIAGNOSIS — M545 Low back pain, unspecified: Secondary | ICD-10-CM | POA: Diagnosis not present

## 2022-08-05 DIAGNOSIS — E039 Hypothyroidism, unspecified: Secondary | ICD-10-CM | POA: Diagnosis not present

## 2022-08-05 DIAGNOSIS — N920 Excessive and frequent menstruation with regular cycle: Secondary | ICD-10-CM | POA: Diagnosis not present

## 2023-01-27 DIAGNOSIS — E782 Mixed hyperlipidemia: Secondary | ICD-10-CM | POA: Diagnosis not present

## 2023-01-27 DIAGNOSIS — E0590 Thyrotoxicosis, unspecified without thyrotoxic crisis or storm: Secondary | ICD-10-CM | POA: Diagnosis not present

## 2023-01-27 DIAGNOSIS — E1165 Type 2 diabetes mellitus with hyperglycemia: Secondary | ICD-10-CM | POA: Diagnosis not present

## 2023-01-27 DIAGNOSIS — I10 Essential (primary) hypertension: Secondary | ICD-10-CM | POA: Diagnosis not present

## 2023-01-27 DIAGNOSIS — E1142 Type 2 diabetes mellitus with diabetic polyneuropathy: Secondary | ICD-10-CM | POA: Diagnosis not present

## 2023-01-30 DIAGNOSIS — E782 Mixed hyperlipidemia: Secondary | ICD-10-CM | POA: Diagnosis not present

## 2023-01-30 DIAGNOSIS — I10 Essential (primary) hypertension: Secondary | ICD-10-CM | POA: Diagnosis not present

## 2023-01-30 DIAGNOSIS — N920 Excessive and frequent menstruation with regular cycle: Secondary | ICD-10-CM | POA: Diagnosis not present

## 2023-01-30 DIAGNOSIS — Z0000 Encounter for general adult medical examination without abnormal findings: Secondary | ICD-10-CM | POA: Diagnosis not present

## 2023-01-30 DIAGNOSIS — I739 Peripheral vascular disease, unspecified: Secondary | ICD-10-CM | POA: Diagnosis not present

## 2023-01-30 DIAGNOSIS — Z0001 Encounter for general adult medical examination with abnormal findings: Secondary | ICD-10-CM | POA: Diagnosis not present

## 2023-01-30 DIAGNOSIS — E1142 Type 2 diabetes mellitus with diabetic polyneuropathy: Secondary | ICD-10-CM | POA: Diagnosis not present

## 2023-01-30 DIAGNOSIS — E611 Iron deficiency: Secondary | ICD-10-CM | POA: Diagnosis not present

## 2023-01-30 DIAGNOSIS — J302 Other seasonal allergic rhinitis: Secondary | ICD-10-CM | POA: Diagnosis not present

## 2023-01-30 DIAGNOSIS — Z1231 Encounter for screening mammogram for malignant neoplasm of breast: Secondary | ICD-10-CM | POA: Diagnosis not present

## 2023-02-01 ENCOUNTER — Encounter (HOSPITAL_COMMUNITY): Payer: Self-pay

## 2023-02-24 DIAGNOSIS — I739 Peripheral vascular disease, unspecified: Secondary | ICD-10-CM | POA: Diagnosis not present

## 2023-02-24 DIAGNOSIS — E1165 Type 2 diabetes mellitus with hyperglycemia: Secondary | ICD-10-CM | POA: Diagnosis not present

## 2023-02-24 DIAGNOSIS — M7918 Myalgia, other site: Secondary | ICD-10-CM | POA: Diagnosis not present

## 2023-03-23 DIAGNOSIS — E1165 Type 2 diabetes mellitus with hyperglycemia: Secondary | ICD-10-CM | POA: Diagnosis not present

## 2023-03-23 DIAGNOSIS — I739 Peripheral vascular disease, unspecified: Secondary | ICD-10-CM | POA: Diagnosis not present

## 2023-03-23 DIAGNOSIS — M7918 Myalgia, other site: Secondary | ICD-10-CM | POA: Diagnosis not present

## 2023-03-23 DIAGNOSIS — E0590 Thyrotoxicosis, unspecified without thyrotoxic crisis or storm: Secondary | ICD-10-CM | POA: Diagnosis not present

## 2023-03-23 DIAGNOSIS — E1142 Type 2 diabetes mellitus with diabetic polyneuropathy: Secondary | ICD-10-CM | POA: Diagnosis not present

## 2023-04-12 ENCOUNTER — Other Ambulatory Visit: Payer: Self-pay

## 2023-04-12 ENCOUNTER — Emergency Department (HOSPITAL_BASED_OUTPATIENT_CLINIC_OR_DEPARTMENT_OTHER)
Admission: EM | Admit: 2023-04-12 | Discharge: 2023-04-13 | Disposition: A | Discharge status: Home / Self Care | Payer: 59 | Attending: Emergency Medicine | Admitting: Emergency Medicine

## 2023-04-12 ENCOUNTER — Encounter (HOSPITAL_BASED_OUTPATIENT_CLINIC_OR_DEPARTMENT_OTHER): Payer: Self-pay | Admitting: Emergency Medicine

## 2023-04-12 ENCOUNTER — Emergency Department (HOSPITAL_BASED_OUTPATIENT_CLINIC_OR_DEPARTMENT_OTHER): Payer: 59

## 2023-04-12 DIAGNOSIS — E119 Type 2 diabetes mellitus without complications: Secondary | ICD-10-CM | POA: Insufficient documentation

## 2023-04-12 DIAGNOSIS — M25562 Pain in left knee: Secondary | ICD-10-CM | POA: Insufficient documentation

## 2023-04-12 DIAGNOSIS — I10 Essential (primary) hypertension: Secondary | ICD-10-CM | POA: Diagnosis not present

## 2023-04-12 DIAGNOSIS — Z7984 Long term (current) use of oral hypoglycemic drugs: Secondary | ICD-10-CM | POA: Diagnosis not present

## 2023-04-12 DIAGNOSIS — Z79899 Other long term (current) drug therapy: Secondary | ICD-10-CM | POA: Insufficient documentation

## 2023-04-12 MED ORDER — PREDNISONE 50 MG PO TABS
60.0000 mg | ORAL_TABLET | Freq: Once | ORAL | Status: AC
  Administered 2023-04-12: 60 mg via ORAL
  Filled 2023-04-12: qty 1

## 2023-04-12 NOTE — Discharge Instructions (Signed)
You are seen in the emergency department for knee pain.  As we discussed your x-ray did not show any broken or dislocated bones.  I think that your pain could be related to some inflammation around your kneecap and those tendons.  I given you a dose of some steroid medication.  I recommend continuing Aleve at home, and you can add in Tylenol as well.  You can try icing the area, and elevating it when you are resting at home.  If it continues to bother you I recommend following up with the orthopedist, and I have attached their contact information.  Continue to monitor how you're doing and return to the ER for new or worsening symptoms.

## 2023-04-12 NOTE — ED Triage Notes (Signed)
Pt states left knee started with stiffness 2-3 days ago, progressively gotten worse, states works on feet.

## 2023-04-13 NOTE — ED Provider Notes (Signed)
[place] [facility]ERGENCY DEPARTMENT AT [facility] Provider Note   CSN: [id number] Arrival date & time: 04/12/23  2019     History  Chief Complaint  Patient presents with   Knee Pain    Rhonda Baldwin is a 42 y.o. female with history of hypertension, diabetes, and thyroid disease who presents to the emergency department complaining of left knee pain and stiffness for the past 2 to 3 days.  Does work on her feet, but denies any injury to the area.  She states is progressively been getting worse.  She has been taking Aleve as needed for menstrual cramps, has not been helping her knee pain at all.  Denies any numbness.  Denies any recent illness.   Knee Pain      Home Medications Prior to Admission medications   Medication Sig Start Date End Date Taking? Authorizing Provider  atenolol (TENORMIN) 50 MG tablet Take 1.5 tablets (75 mg total) daily by mouth. 07/02/17   Rolan Bucco, MD  atorvastatin (LIPITOR) 20 MG tablet Take 1 tablet (20 mg total) daily by mouth. 07/02/17   Rolan Bucco, MD  diclofenac (VOLTAREN) 75 MG EC tablet Take 1 tablet (75 mg total) by mouth 2 (two) times daily. 08/01/20   Elson Areas, PA-C  glucose monitoring kit (FREESTYLE) monitoring kit 1 each by Does not apply route as needed for other. 08/23/13   Richarda Overlie, MD  ibuprofen (ADVIL,MOTRIN) 800 MG tablet Take 1 tablet (800 mg total) by mouth 3 (three) times daily. 02/23/17   Palumbo, April, MD  insulin NPH-regular Human (HUMULIN 70/30) (70-30) 100 UNIT/ML injection Inject 35 Units into the skin daily with breakfast. 35 units with breakfast; 45 units bedtime    [provider]  insulin NPH-regular Human (NOVOLIN 70/30) (70-30) 100 UNIT/ML injection Inject 25 Units 2 (two) times daily with a meal into the skin. Patient not taking: Reported on 06/25/2019 07/02/17   Rolan Bucco, MD  Lancets (FREESTYLE) lancets Use as instructed 01/19/15   Quentin Angst, MD   lisinopril-hydrochlorothiazide (PRINZIDE,ZESTORETIC) 20-25 MG tablet Take 1 tablet daily by mouth. 07/02/17   Rolan Bucco, MD  loratadine (CLARITIN) 10 MG tablet Take 1 tablet (10 mg total) by mouth daily as needed for allergies. 12/15/14   Quentin Angst, MD  metFORMIN (GLUCOPHAGE) 1000 MG tablet Take 1 tablet (1,000 mg total) 2 (two) times daily with a meal by mouth. Patient not taking: Reported on 10/31/2019 07/02/17   Rolan Bucco, MD  methimazole (TAPAZOLE) 10 MG tablet Take 2 tablets (20 mg total) 2 (two) times daily by mouth. 07/02/17   Rolan Bucco, MD  methocarbamol (ROBAXIN) 500 MG tablet Take 1 tablet (500 mg total) by mouth 4 (four) times daily. 08/01/20   Elson Areas, PA-C  naproxen sodium (ANAPROX) 220 MG tablet Take 440 mg by mouth 2 (two) times daily with a meal. Reported on 08/11/2015    [provider]  norethindrone (ORTHO MICRONOR) 0.35 MG tablet Take 1 tablet (0.35 mg total) by mouth daily. 07/05/19 07/04/20  Marylene Land, CNM  TRUEPLUS INSULIN SYRINGE 30G X 5/16" 0.5 ML MISC USE AS DIRECTED WITH INSULIN 02/13/15   Quentin Angst, MD      Allergies    Orange    Review of Systems   Review of Systems  Musculoskeletal:  Positive for arthralgias.       Left knee pain  All other systems reviewed and are negative.   Physical Exam Updated Vital Signs  BP (!) 167/90 (BP Location: Right Arm)   Pulse 100   Temp 98.4 F (36.9 C) (Oral)   Resp 20   Ht 5\' 3"  (1.6 m)   Wt (!) 158.8 kg   LMP 04/12/2023   SpO2 100%   BMI 62.00 kg/m  Physical Exam Vitals and nursing note reviewed.  Constitutional:      Appearance: Normal appearance.  HENT:     Head: Normocephalic and atraumatic.  Eyes:     Conjunctiva/sclera: Conjunctivae normal.  Cardiovascular:     Pulses:          Posterior tibial pulses are 2+ on the right side and 2+ on the left side.  Pulmonary:     Effort: Pulmonary effort is normal. No respiratory distress.   Musculoskeletal:     Comments: No appreciable swelling to the knee or significant tenderness.  Very mild tenderness over the patellar tendon, worse with flexing the left knee.  No calf tenderness or swelling.  Skin:    General: Skin is warm and dry.  Neurological:     Mental Status: She is alert.  Psychiatric:        Mood and Affect: Mood normal.        Behavior: Behavior normal.     ED Results / Procedures / Treatments   Labs (all labs ordered are listed, but only abnormal results are displayed) Labs Reviewed - No data to display  EKG None  Radiology DG Knee Complete 4 Views Left  Result Date: 04/12/2023 CLINICAL DATA:  Left knee pain for 2-3 days.  No known injury. EXAM: LEFT KNEE - COMPLETE 4+ VIEW COMPARISON:  12/25/2017. FINDINGS: No evidence of fracture, dislocation, or joint effusion. No evidence of arthropathy or other focal bone abnormality. Soft tissues are unremarkable. IMPRESSION: Negative. Electronically Signed   By: Thornell Sartorius M.D.   On: 04/12/2023 21:37    Procedures Procedures    Medications Ordered in ED Medications  predniSONE (DELTASONE) tablet 60 mg (60 mg Oral Given 04/12/23 2347)    ED Course/ Medical Decision Making/ A&P                                 Medical Decision Making Amount and/or Complexity of Data Reviewed Radiology: ordered.   This patient is a 42 y.o. female  who presents to the ED for concern of left knee pain x 3 days.   Differential diagnoses prior to evaluation: The emergent differential diagnosis includes, but is not limited to, fracture, dislocation, ligamentous injury, DVT, septic joint, cellulitis. This is not an exhaustive differential.   Past Medical History / Co-morbidities / Social History: Hypertension, diabetes, thyroid disease  Physical Exam: Physical exam performed. The pertinent findings include: Some tenderness to palpation over the left patellar tendon, pain worse with knee flexion.  No appreciable swelling  of the joint or calf.  No calf tenderness.  Normal distal pulses.  Lab Tests/Imaging studies: I personally interpreted labs/imaging and the pertinent results include: X-ray of the left knee without acute findings.. I agree with the radiologist interpretation.  Medications: I ordered medication including prednisone.  I have reviewed the patients home medicines and have made adjustments as needed.   Disposition: After consideration of the diagnostic results and the patients response to treatment, I feel that emergency department workup does not suggest an emergent condition requiring admission or immediate intervention beyond what has been performed at this time. The plan is:  Discharge to home with symptomatic management of left knee pain.  Most likely tendinitis, could be from overuse.  Low concern for septic joint as joint not erythematous, no increased warmth, no pain with passive ROM.  No calf tenderness or swelling to suggest possible DVT.  Will provide with Ace wrap and recommend NSAIDs.  Given orthopedic follow-up if symptoms do not improve.  Did discuss verse effect of steroids increasing patient's blood sugar.  That is why we elected to do a one-time dose versus a taper or burst.  She will monitor her blood sugar closely at home. The patient is safe for discharge and has been instructed to return immediately for worsening symptoms, change in symptoms or any other concerns.  Final Clinical Impression(s) / ED Diagnoses Final diagnoses:  Acute pain of left knee    Rx / DC Orders ED Discharge Orders     None      Portions of this report may have been transcribed using voice recognition software. Every effort was made to ensure accuracy; however, inadvertent computerized transcription errors may be present.    Jeanella Flattery 04/13/23 1622    Charlynne Pander, MD 04/13/23 [phone]

## 2023-05-03 ENCOUNTER — Other Ambulatory Visit: Payer: Self-pay

## 2023-05-03 ENCOUNTER — Other Ambulatory Visit (HOSPITAL_COMMUNITY)
Admission: RE | Admit: 2023-05-03 | Discharge: 2023-05-03 | Disposition: A | Discharge status: Home / Self Care | Payer: 59 | Source: Ambulatory Visit | Attending: Obstetrics & Gynecology | Admitting: Obstetrics & Gynecology

## 2023-05-03 ENCOUNTER — Encounter: Payer: Self-pay | Admitting: Obstetrics & Gynecology

## 2023-05-03 ENCOUNTER — Ambulatory Visit (INDEPENDENT_AMBULATORY_CARE_PROVIDER_SITE_OTHER): Payer: 59 | Admitting: Obstetrics & Gynecology

## 2023-05-03 VITALS — BP 125/49 | HR 76 | Ht 63.0 in | Wt 352.0 lb

## 2023-05-03 DIAGNOSIS — Z113 Encounter for screening for infections with a predominantly sexual mode of transmission: Secondary | ICD-10-CM

## 2023-05-03 DIAGNOSIS — Z1231 Encounter for screening mammogram for malignant neoplasm of breast: Secondary | ICD-10-CM | POA: Diagnosis not present

## 2023-05-03 DIAGNOSIS — Z01419 Encounter for gynecological examination (general) (routine) without abnormal findings: Secondary | ICD-10-CM

## 2023-05-03 NOTE — Progress Notes (Signed)
GYNECOLOGY ANNUAL PREVENTATIVE CARE ENCOUNTER NOTE  History:     Rhonda Baldwin is a 42 y.o. G1P1 female here for a routine annual gynecologic exam.  Current complaints: none.   Denies abnormal vaginal bleeding, discharge, pelvic pain, problems with intercourse or other gynecologic concerns.    Gynecologic History Patient's last menstrual period was 04/12/2023. Contraception: none Last Pap: 06/25/2019. Result was normal with negative HPV   Obstetric History OB History  Gravida Para Term Preterm AB Living  1 1       1   SAB IAB Ectopic Multiple Live Births          1    # Outcome Date GA Lbr Len/2nd Weight Sex Type Anes PTL Lv  1 Para 2001     CS-Unspec       Past Medical History:  Diagnosis Date   Diabetes mellitus    Hypertension    Thyroid disease     Past Surgical History:  Procedure Laterality Date   CESAREAN SECTION     CHOLECYSTECTOMY      Current Outpatient Medications on File Prior to Visit  Medication Sig Dispense Refill   Alpha-Lipoic Acid 100 MG CAPS Take 1 capsule every day by oral route.     atenolol (TENORMIN) 50 MG tablet Take 1.5 tablets (75 mg total) daily by mouth. 45 tablet 1   glucose monitoring kit (FREESTYLE) monitoring kit 1 each by Does not apply route as needed for other. 1 each 2   insulin NPH-regular Human (NOVOLIN 70/30) (70-30) 100 UNIT/ML injection Inject 60 Units into the skin at bedtime.     Lancets (FREESTYLE) lancets Use as instructed 100 each 12   lisinopril-hydrochlorothiazide (PRINZIDE,ZESTORETIC) 20-25 MG tablet Take 1 tablet daily by mouth. 30 tablet 1   loratadine (CLARITIN) 10 MG tablet Take 1 tablet (10 mg total) by mouth daily as needed for allergies. 30 tablet 3   methimazole (TAPAZOLE) 10 MG tablet Take 2 tablets (20 mg total) 2 (two) times daily by mouth. 120 tablet 1   rosuvastatin (CRESTOR) 20 MG tablet Take by mouth.     TRUEPLUS INSULIN SYRINGE 30G X 5/16" 0.5 ML MISC USE AS DIRECTED WITH INSULIN 100 each 5    ASPIRIN LOW DOSE 81 MG tablet Take 81 mg by mouth daily.     atorvastatin (LIPITOR) 20 MG tablet Take 1 tablet (20 mg total) daily by mouth. 30 tablet 1   diclofenac (VOLTAREN) 75 MG EC tablet Take 1 tablet (75 mg total) by mouth 2 (two) times daily. 20 tablet 0   glimepiride (AMARYL) 2 MG tablet TAKE 1 TABLET BY MOUTH ONCE  A DAY BEFORE MEALS     ibuprofen (ADVIL,MOTRIN) 800 MG tablet Take 1 tablet (800 mg total) by mouth 3 (three) times daily. 21 tablet 0   insulin NPH-regular Human (HUMULIN 70/30) (70-30) 100 UNIT/ML injection Inject 35 Units into the skin daily with breakfast. 35 units with breakfast; 45 units bedtime     insulin NPH-regular Human (NOVOLIN 70/30) (70-30) 100 UNIT/ML injection Inject 25 Units 2 (two) times daily with a meal into the skin. (Patient not taking: Reported on 06/25/2019) 15 vial 2   metFORMIN (GLUCOPHAGE) 1000 MG tablet Take 1 tablet (1,000 mg total) 2 (two) times daily with a meal by mouth. (Patient not taking: Reported on 10/31/2019) 60 tablet 1   methocarbamol (ROBAXIN) 500 MG tablet Take 1 tablet (500 mg total) by mouth 4 (four) times daily. (Patient not taking: Reported on 05/03/2023)  20 tablet 0   MOUNJARO 2.5 MG/0.5ML Pen SMARTSIG:5 Milligram(s) SUB-Q Once a Week     naproxen sodium (ANAPROX) 220 MG tablet Take 440 mg by mouth 2 (two) times daily with a meal. Reported on 08/11/2015 (Patient not taking: Reported on 05/03/2023)     norethindrone (ORTHO MICRONOR) 0.35 MG tablet Take 1 tablet (0.35 mg total) by mouth daily. 1 Package 11   No current facility-administered medications on file prior to visit.    Allergies  Allergen Reactions   Orange Swelling    oranges    Social History:  reports that she has never smoked. She has never used smokeless tobacco. She reports that she does not drink alcohol and does not use drugs.  Family History  Problem Relation Age of Onset   Hypertension Mother    Hypertension Father     The following portions of the  patient's history were reviewed and updated as appropriate: allergies, current medications, past family history, past medical history, past social history, past surgical history and problem list.  Review of Systems Pertinent items noted in HPI and remainder of comprehensive ROS otherwise negative.  Physical Exam:  BP (!) 125/49   Pulse 76   Ht 5\' 3"  (1.6 m)   Wt (!) 352 lb (159.7 kg)   LMP 04/12/2023   BMI 62.35 kg/m  CONSTITUTIONAL: Well-developed, well-nourished female in no acute distress.  HENT:  Normocephalic, atraumatic, External right and left ear normal.  EYES: Conjunctivae and EOM are normal. Pupils are equal, round, and reactive to light. No scleral icterus.  NECK: Normal range of motion, supple, no masses.  Normal thyroid.  SKIN: Skin is warm and dry. No rash noted. Not diaphoretic. No erythema. No pallor. MUSCULOSKELETAL: Normal range of motion. No tenderness.  No cyanosis, clubbing, or edema. NEUROLOGIC: Alert and oriented to person, place, and time. Normal reflexes, muscle tone coordination.  PSYCHIATRIC: Normal mood and affect. Normal behavior. Normal judgment and thought content. CARDIOVASCULAR: Normal heart rate noted, regular rhythm RESPIRATORY: Clear to auscultation bilaterally. Effort and breath sounds normal, no problems with respiration noted. BREASTS: Symmetric in size. No masses, tenderness, skin changes, nipple drainage, or lymphadenopathy bilaterally. Performed in the presence of a chaperone. ABDOMEN: Soft, obese, no distention noted.  No tenderness, rebound or guarding.  PELVIC: Deferred   Assessment and Plan:    1. Routine screening for STI (sexually transmitted infection STI screen done by request, will follow up results and manage accordingly. - RPR+HBsAg+HCVAb+HIV - Cervicovaginal ancillary only  2. Screening mammogram for breast cancer Normal breast examination today, she was advised to perform periodic self breast examinations.  Mammogram scheduled  for breast cancer screening. - MM 3D SCREENING MAMMOGRAM BILATERAL BREAST; Future  3. Well woman exam with routine gynecological exam Pap smear is up to date, due next year and patient is aware. Routine preventative health maintenance measures emphasized. Please refer to After Visit Summary for other counseling recommendations.       Jaynie Collins, MD, FACOG Obstetrician & Gynecologist, [facility] for [employer], [facility]

## 2023-05-04 LAB — RPR+HBSAG+HCVAB+...
HIV Screen 4th Generation wRfx: NONREACTIVE
Hep C Virus Ab: NONREACTIVE
Hepatitis B Surface Ag: NEGATIVE
RPR Ser Ql: NONREACTIVE

## 2023-05-05 LAB — CERVICOVAGINAL ANCILLARY ONLY
Chlamydia: NEGATIVE
Comment: NEGATIVE
Comment: NEGATIVE
Comment: NORMAL
Neisseria Gonorrhea: NEGATIVE
Trichomonas: NEGATIVE

## 2023-05-08 DIAGNOSIS — Z23 Encounter for immunization: Secondary | ICD-10-CM | POA: Diagnosis not present

## 2023-05-08 DIAGNOSIS — E1165 Type 2 diabetes mellitus with hyperglycemia: Secondary | ICD-10-CM | POA: Diagnosis not present

## 2023-05-08 DIAGNOSIS — E1142 Type 2 diabetes mellitus with diabetic polyneuropathy: Secondary | ICD-10-CM | POA: Diagnosis not present

## 2023-05-08 DIAGNOSIS — I739 Peripheral vascular disease, unspecified: Secondary | ICD-10-CM | POA: Diagnosis not present

## 2023-05-08 DIAGNOSIS — I10 Essential (primary) hypertension: Secondary | ICD-10-CM | POA: Diagnosis not present

## 2023-05-28 ENCOUNTER — Emergency Department (HOSPITAL_COMMUNITY)
Admission: EM | Admit: 2023-05-28 | Discharge: 2023-05-29 | Disposition: A | Discharge status: Home / Self Care | Payer: 59 | Attending: Emergency Medicine | Admitting: Emergency Medicine

## 2023-05-28 ENCOUNTER — Other Ambulatory Visit: Payer: Self-pay

## 2023-05-28 ENCOUNTER — Encounter (HOSPITAL_COMMUNITY): Payer: Self-pay

## 2023-05-28 DIAGNOSIS — Z7982 Long term (current) use of aspirin: Secondary | ICD-10-CM | POA: Diagnosis not present

## 2023-05-28 DIAGNOSIS — Z79899 Other long term (current) drug therapy: Secondary | ICD-10-CM | POA: Insufficient documentation

## 2023-05-28 DIAGNOSIS — E119 Type 2 diabetes mellitus without complications: Secondary | ICD-10-CM | POA: Insufficient documentation

## 2023-05-28 DIAGNOSIS — Z7984 Long term (current) use of oral hypoglycemic drugs: Secondary | ICD-10-CM | POA: Insufficient documentation

## 2023-05-28 DIAGNOSIS — I10 Essential (primary) hypertension: Secondary | ICD-10-CM | POA: Diagnosis not present

## 2023-05-28 DIAGNOSIS — R102 Pelvic and perineal pain: Secondary | ICD-10-CM | POA: Diagnosis not present

## 2023-05-28 DIAGNOSIS — N939 Abnormal uterine and vaginal bleeding, unspecified: Secondary | ICD-10-CM | POA: Insufficient documentation

## 2023-05-28 DIAGNOSIS — Z794 Long term (current) use of insulin: Secondary | ICD-10-CM | POA: Insufficient documentation

## 2023-05-28 DIAGNOSIS — D72829 Elevated white blood cell count, unspecified: Secondary | ICD-10-CM | POA: Diagnosis not present

## 2023-05-28 LAB — CBC
HCT: 36.5 % (ref 36.0–46.0)
Hemoglobin: 11.4 g/dL — ABNORMAL LOW (ref 12.0–15.0)
MCH: 27 pg (ref 26.0–34.0)
MCHC: 31.2 g/dL (ref 30.0–36.0)
MCV: 86.5 fL (ref 80.0–100.0)
Platelets: 239 10*3/uL (ref 150–400)
RBC: 4.22 MIL/uL (ref 3.87–5.11)
RDW: 14.3 % (ref 11.5–15.5)
WBC: 11.8 10*3/uL — ABNORMAL HIGH (ref 4.0–10.5)
nRBC: 0 % (ref 0.0–0.2)

## 2023-05-28 NOTE — ED Triage Notes (Signed)
Pt complaining of heavy vaginal bleeding and passing quarter sized blood clots. Since 6pm she has saturated 6-7 sanitary napkins. Has not had this before and she said if it was her cycle it is 2 weeks early.

## 2023-05-29 ENCOUNTER — Emergency Department (HOSPITAL_COMMUNITY): Payer: 59

## 2023-05-29 LAB — TSH: TSH: 2.414 u[IU]/mL (ref 0.350–4.500)

## 2023-05-29 LAB — HCG, SERUM, QUALITATIVE: Preg, Serum: NEGATIVE

## 2023-05-29 MED ORDER — MEGESTROL ACETATE 40 MG PO TABS: ORAL_TABLET | ORAL | 0 refills | Status: AC

## 2023-05-29 NOTE — ED Notes (Signed)
Trasported to U/[facility]

## 2023-05-29 NOTE — Discharge Instructions (Addendum)
You were seen in the ER for evaluation of your vaginal bleeding. I have spoken with gynecology here, and these are the recommendation for medications for you. Please follow the directions on the bottle. Please make sure that you follow up with your gynecologist. Call to schedule an appointment ASAP. If you have any concerns, new or worsening symptoms, please return to the ER for re-evaluation.   Contact a doctor if: The bleeding lasts more than one week. You feel dizzy at times. You feel like you may vomit (nausea). You vomit. You feel light-headed or weak. Your symptoms get worse. Get help right away if: You faint. You have to change pads every hour. You have pain in your belly. You have a fever or chills. You get sweaty or weak. You pass large blood clots from your vagina. These symptoms may be an emergency. Get help right away. Call your local emergency services (911 in the [place] Do not wait to see if the symptoms will go away. Do not drive yourself to the hospital.

## 2023-05-29 NOTE — ED Provider Notes (Signed)
Physical Exam  BP (!) 115/56   Pulse 76   Temp 97.9 F (36.6 C) (Oral)   Resp 10   Ht 5\' 3"  (1.6 m)   Wt (!) 159.7 kg   LMP 05/21/2023   SpO2 100%   BMI 62.35 kg/m   Physical Exam Vitals and nursing note reviewed.  Constitutional:      General: She is not in acute distress.    Appearance: She is not toxic-appearing.  Eyes:     General: No scleral icterus. Pulmonary:     Effort: Pulmonary effort is normal. No respiratory distress.  Skin:    General: Skin is warm and dry.  Neurological:     General: No focal deficit present.     Mental Status: She is alert. Mental status is at baseline.  Psychiatric:        Mood and Affect: Mood normal.     Procedures  Procedures  ED Course / MDM    Medical Decision Making Amount and/or Complexity of Data Reviewed Labs: ordered. Radiology: ordered.  Risk Prescription drug management.   Accepted handoff at shift change from [facility], PA-C. Please see prior provider note for more detail.   Briefly: Patient is 42 y.o. F presents to the ER for evaluation of heavy menstrual bleeding.   DDX: concern for polyp, leiomyoma, adenomyoma, AUB  Plan: Follow up with [place]   Hemoglobin mildly low at 11.4.  [place] report per radiology read  IMPRESSION: 1. Limited study due to bowel gas and positioning. 2. Endometrial stripe measures 9 mm in there may be some blood products in the endometrial canal. Close follow-up recommended. 3. Nonvisualization of the ovaries.  TSH is within normal limits.    I spoke with Dr. Charlotta Newton with gynecology who recommends Megace Taper of 120mg  for 5 days, 80mg  for 5 days, and then 40mg  for 30 days. Follow up with gynecologist for perimenopausal bleeding.   I discussed with the patient my conversation with gynecology.  Discussed with her to follow-up with her gynecologist and take the medications as prescribed.  We discussed return precautions and red flag symptoms.  Patient verbalized understanding and agrees  the plan.  Patient is stable for discharge home  US Pelvis Complete  Result Date: 05/29/2023 CLINICAL DATA:  Vaginal bleeding with pelvic pain. EXAM: TRANSABDOMINAL AND TRANSVAGINAL ULTRASOUND OF PELVIS DOPPLER ULTRASOUND OF OVARIES TECHNIQUE: Both transabdominal and transvaginal ultrasound examinations of the pelvis were performed. Transabdominal technique was performed for global imaging of the pelvis including uterus, ovaries, adnexal regions, and pelvic cul-de-sac. It was necessary to proceed with endovaginal exam following the transabdominal exam to visualize the . Color and duplex Doppler ultrasound was utilized to evaluate blood flow to the ovaries. COMPARISON:  12/18/2014 FINDINGS: Uterus Measurements: 11.0 x 3.2 x 2.9 cm = volume: 54 mL. No fibroids or other mass visualized.Uterus not well seen on transabdominal imaging due to bowel gas and not well seen on trans vaginal imaging due to positioning. Endometrium Thickness: 9 mm. No focal abnormality visualized. Potential blood products in the endometrial cavity. Right ovary Not visualized. Left ovary Not visualized. Pulsed Doppler evaluation of both ovaries demonstrates N/A . Other findings No abnormal free fluid. IMPRESSION: 1. Limited study due to bowel gas and positioning. 2. Endometrial stripe measures 9 mm in there may be some blood products in the endometrial canal. Close follow-up recommended. 3. Nonvisualization of the ovaries. Electronically Signed   By: Kennith Center M.D.   On: 05/29/2023 07:22   US Transvaginal  Non-OB  Result Date: 05/29/2023 CLINICAL DATA:  Vaginal bleeding with pelvic pain. EXAM: TRANSABDOMINAL AND TRANSVAGINAL ULTRASOUND OF PELVIS DOPPLER ULTRASOUND OF OVARIES TECHNIQUE: Both transabdominal and transvaginal ultrasound examinations of the pelvis were performed. Transabdominal technique was performed for global imaging of the pelvis including uterus, ovaries, adnexal regions, and pelvic cul-de-sac. It was necessary to  proceed with endovaginal exam following the transabdominal exam to visualize the . Color and duplex Doppler ultrasound was utilized to evaluate blood flow to the ovaries. COMPARISON:  12/18/2014 FINDINGS: Uterus Measurements: 11.0 x 3.2 x 2.9 cm = volume: 54 mL. No fibroids or other mass visualized.Uterus not well seen on transabdominal imaging due to bowel gas and not well seen on trans vaginal imaging due to positioning. Endometrium Thickness: 9 mm. No focal abnormality visualized. Potential blood products in the endometrial cavity. Right ovary Not visualized. Left ovary Not visualized. Pulsed Doppler evaluation of both ovaries demonstrates N/A . Other findings No abnormal free fluid. IMPRESSION: 1. Limited study due to bowel gas and positioning. 2. Endometrial stripe measures 9 mm in there may be some blood products in the endometrial canal. Close follow-up recommended. 3. Nonvisualization of the ovaries. Electronically Signed   By: Kennith Center M.D.   On: 05/29/2023 07:22   [place] Art/Ven Flow Abd Pelv Doppler  Result Date: 05/29/2023 CLINICAL DATA:  Vaginal bleeding with pelvic pain. EXAM: TRANSABDOMINAL AND TRANSVAGINAL ULTRASOUND OF PELVIS DOPPLER ULTRASOUND OF OVARIES TECHNIQUE: Both transabdominal and transvaginal ultrasound examinations of the pelvis were performed. Transabdominal technique was performed for global imaging of the pelvis including uterus, ovaries, adnexal regions, and pelvic cul-de-sac. It was necessary to proceed with endovaginal exam following the transabdominal exam to visualize the . Color and duplex Doppler ultrasound was utilized to evaluate blood flow to the ovaries. COMPARISON:  12/18/2014 FINDINGS: Uterus Measurements: 11.0 x 3.2 x 2.9 cm = volume: 54 mL. No fibroids or other mass visualized.Uterus not well seen on transabdominal imaging due to bowel gas and not well seen on trans vaginal imaging due to positioning. Endometrium Thickness: 9 mm. No focal abnormality visualized.  Potential blood products in the endometrial cavity. Right ovary Not visualized. Left ovary Not visualized. Pulsed Doppler evaluation of both ovaries demonstrates N/A . Other findings No abnormal free fluid. IMPRESSION: 1. Limited study due to bowel gas and positioning. 2. Endometrial stripe measures 9 mm in there may be some blood products in the endometrial canal. Close follow-up recommended. 3. Nonvisualization of the ovaries. Electronically Signed   By: Kennith Center M.D.   On: 05/29/2023 07:22         Achille Rich, PA-C 05/29/23 4132    Rexford Maus, DO 05/29/23 1550

## 2023-05-29 NOTE — ED Provider Notes (Signed)
[place] EMERGENCY DEPARTMENT AT [facility] Provider Note   CSN: [id number] Arrival date & time: 05/28/23  2306     History  Chief Complaint  Patient presents with   Rhonda Baldwin    Rhonda Baldwin is a 42 y.o. female who presents with concern for heavy Rhonda Baldwin that started 24 hours ago. States she has been Baldwin through ~ a super tampon and pad hour. States she has bled through er tampon/pad/brief/ and clothing in the ED waiting room today.   Hx of painful periods, but no heavy Rhonda Baldwin like this in the past. LMP 2 weeks ago. Follow[facility] with [facility] for [facility].   I have reviewed her medical records, hx of DM HTN. No anticoagulation.    HPI     Home Medications Prior to Admission medications   Medication Sig Start Date End Date Taking? Authorizing Provider  Alpha-Lipoic Acid 100 MG CAPS Take 1 capsule every day by oral route. 02/04/22   [provider]  ASPIRIN LOW DOSE 81 MG tablet Take 81 mg by mouth daily.    [provider]  atenolol (TENORMIN) 50 MG tablet Take 1.5 tablets (75 mg total) daily by mouth. 07/02/17   Rolan Bucco, MD  atorvastatin (LIPITOR) 20 MG tablet Take 1 tablet (20 mg total) daily by mouth. 07/02/17   Rolan Bucco, MD  diclofenac (VOLTAREN) 75 MG EC tablet Take 1 tablet (75 mg total) by mouth 2 (two) times daily. 08/01/20   Elson Areas, PA-C  glimepiride (AMARYL) 2 MG tablet TAKE 1 TABLET BY MOUTH ONCE  A DAY BEFORE MEALS    [provider]  glucose monitoring kit (FREESTYLE) monitoring kit 1 each by Does not apply route as needed for other. 08/23/13   Richarda Overlie, MD  ibuprofen (ADVIL,MOTRIN) 800 MG tablet Take 1 tablet (800 mg total) by mouth 3 (three) times daily. 02/23/17   Palumbo, April, MD  insulin NPH-regular Human (HUMULIN 70/30) (70-30) 100 UNIT/ML injection Inject 35 Units into the skin daily with breakfast. 35 units with breakfast; 45 units bedtime     [provider]  insulin NPH-regular Human (NOVOLIN 70/30) (70-30) 100 UNIT/ML injection Inject 25 Units 2 (two) times daily with a meal into the skin. Patient not taking: Reported on 06/25/2019 07/02/17   Rolan Bucco, MD  insulin NPH-regular Human (NOVOLIN 70/30) (70-30) 100 UNIT/ML injection Inject 60 Units into the skin at bedtime. 02/18/22   [provider]  Lancets (FREESTYLE) lancets Use as instructed 01/19/15   Quentin Angst, MD  lisinopril-hydrochlorothiazide (PRINZIDE,ZESTORETIC) 20-25 MG tablet Take 1 tablet daily by mouth. 07/02/17   Rolan Bucco, MD  loratadine (CLARITIN) 10 MG tablet Take 1 tablet (10 mg total) by mouth daily as needed for allergies. 12/15/14   Quentin Angst, MD  metFORMIN (GLUCOPHAGE) 1000 MG tablet Take 1 tablet (1,000 mg total) 2 (two) times daily with a meal by mouth. Patient not taking: Reported on 10/31/2019 07/02/17   Rolan Bucco, MD  methimazole (TAPAZOLE) 10 MG tablet Take 2 tablets (20 mg total) 2 (two) times daily by mouth. 07/02/17   Rolan Bucco, MD  methocarbamol (ROBAXIN) 500 MG tablet Take 1 tablet (500 mg total) by mouth 4 (four) times daily. Patient not taking: Reported on 05/03/2023 08/01/20   Elson Areas, PA-C  MOUNJARO 2.5 MG/0.5ML Pen SMARTSIG:5 Milligram(s) SUB-Q Once a Week    [provider]  naproxen sodium (ANAPROX) 220 MG tablet Take 440 mg by  mouth 2 (two) times daily with a meal. Reported on 08/11/2015 Patient not taking: Reported on 05/03/2023    [provider]  norethindrone (ORTHO MICRONOR) 0.35 MG tablet Take 1 tablet (0.35 mg total) by mouth daily. 07/05/19 07/04/20  Marylene Land, CNM  rosuvastatin (CRESTOR) 20 MG tablet Take by mouth. 01/25/21   [provider]  TRUEPLUS INSULIN SYRINGE 30G X 5/16" 0.5 ML MISC USE AS DIRECTED WITH INSULIN 02/13/15   Quentin Angst, MD      Allergies    Orange    Review of Systems   Review of Systems   Constitutional: Negative.   HENT: Negative.    Respiratory: Negative.    Cardiovascular: Negative.   Gastrointestinal: Negative.   Genitourinary:  Positive for Rhonda Baldwin. Negative for decreased urine volume, dyspareunia, Rhonda discharge and Rhonda pain.    Physical Exam Updated Vital Signs BP (!) 115/56   Pulse 76   Temp 97.9 F (36.6 C) (Oral)   Resp 10   Ht 5\' 3"  (1.6 m)   Wt (!) 159.7 kg   LMP 05/21/2023   SpO2 100%   BMI 62.35 kg/m  Physical Exam Vitals and nursing note reviewed.  Constitutional:      Appearance: She is morbidly obese. She is not ill-appearing or toxic-appearing.  HENT:     Head: Normocephalic and atraumatic.     Mouth/Throat:     Mouth: Mucous membranes are moist.     Pharynx: No oropharyngeal exudate or posterior oropharyngeal erythema.  Eyes:     General:        Right eye: No discharge.        Left eye: No discharge.     Conjunctiva/sclera: Conjunctivae normal.  Cardiovascular:     Rate and Rhythm: Normal rate and regular rhythm.     Pulses: Normal pulses.     Heart sounds: Normal heart sounds. No murmur heard. Pulmonary:     Effort: Pulmonary effort is normal. No respiratory distress.     Breath sounds: Normal breath sounds. No wheezing or rales.  Abdominal:     General: Bowel sounds are normal. There is no distension.     Palpations: Abdomen is soft.     Tenderness: There is no abdominal tenderness. There is no guarding or rebound.  Genitourinary:    Comments: GU exam deferred by patient.  Musculoskeletal:        General: No deformity.     Cervical back: Neck supple.     Right lower leg: No edema.     Left lower leg: No edema.  Skin:    General: Skin is warm and dry.     Capillary Refill: Capillary refill takes less than 2 seconds.  Neurological:     General: No focal deficit present.     Mental Status: She is alert and oriented to person, place, and time. Mental status is at baseline.  Psychiatric:        Mood and  Affect: Mood normal.     ED Results / Procedures / Treatments   Labs (all labs ordered are listed, but only abnormal results are displayed) Labs Reviewed  CBC - Abnormal; Notable for the following components:      Result Value   WBC 11.8 (*)    Hemoglobin 11.4 (*)    All other components within normal limits  HCG, SERUM, QUALITATIVE  TSH    EKG None  Radiology No results found.  Procedures Procedures    Medications Ordered  in ED Medications - No data to display  ED Course/ Medical Decision Making/ A&P                                 Medical Decision Making 61 female presents with report of menometorrhagia x 24 hours.  Hypertensive on intake and vitals otherwise home.  Cardiopulmonary exams are benign.   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  Amount and/or Complexity of Data Reviewed Labs: ordered.    Details:   CBC with mild leukocytosis of 11.8, mildly low hemoglobin 11.4.  Pregnancy test is negative.   Radiology: ordered.    Details: US pelvis pending    Care of this patient signed out to oncoming ED provider R. Alison Murray, PA-C at time of shift change. All pertinent HPI, physical exam, and laboratory findings were discussed with them prior to my departure. Disposition of patient pending completion of workup, reevaluation, and clinical judgement of oncoming ED provider.   Kristopher  voiced understanding of her medical evaluation and treatment plan thus far. Each of their questions answered to their expressed satisfaction.   This chart was dictated using voice recognition software, Dragon. Despite the best efforts of this provider to proofread and correct errors, errors may still occur which can change documentation meaning.         Final Clinical Impression(s) / ED Diagnoses Final diagnoses:  None    Rx / DC Orders ED  Discharge Orders     None         Sherrilee Gilles 05/29/23 9518    Zadie Rhine, MD 05/29/23 [phone]

## 2023-05-31 ENCOUNTER — Encounter: Payer: Self-pay | Admitting: Obstetrics and Gynecology

## 2023-05-31 ENCOUNTER — Other Ambulatory Visit: Payer: Self-pay

## 2023-05-31 ENCOUNTER — Ambulatory Visit: Payer: 59 | Admitting: Obstetrics and Gynecology

## 2023-05-31 VITALS — BP 150/78 | HR 98 | Wt 357.1 lb

## 2023-05-31 DIAGNOSIS — Z1331 Encounter for screening for depression: Secondary | ICD-10-CM

## 2023-05-31 DIAGNOSIS — N939 Abnormal uterine and vaginal bleeding, unspecified: Secondary | ICD-10-CM | POA: Diagnosis not present

## 2023-05-31 MED ORDER — MEGESTROL ACETATE 40 MG PO TABS: 80.0000 mg | ORAL_TABLET | Freq: Every day | ORAL | 5 refills | Status: DC

## 2023-05-31 NOTE — Progress Notes (Signed)
GYNECOLOGY VISIT  Patient name: Rhonda Baldwin MRN [id number]  Date of birth: [birth year] Chief Complaint:   Vaginal Bleeding  History:  Rhonda Baldwin is a 42 y.o. G1P1 being seen today for AUB.  Bleeding started Saturday and passing large clots, went to the ED, given megace which has slowed down the bleeding but has not stopped it. This bleeding started outside of expected typically anbticpated bleeidng. No pain with the bleeding. No changes in medicaiton recently. Was prescribed the medicaiton for the bleeding. Felt like it was "flowing like a faucet along with large clots. Was not as bad on Saturday, thought it was her period coming sooner. Sunday morning it had slowed down  Has not had intercourse since last cycle   *would be ok w/ getting rid of her uterus, she has completed childbearing   Past Medical History:  Diagnosis Date   Diabetes mellitus    Hypertension    Thyroid disease     Past Surgical History:  Procedure Laterality Date   CESAREAN SECTION     CHOLECYSTECTOMY      The following portions of the patient's history were reviewed and updated as appropriate: allergies, current medications, past family history, past medical history, past social history, past surgical history and problem list.   Health Maintenance:   Last pap     Component Value Date/Time   DIAGPAP  06/25/2019 1614    - Negative for intraepithelial lesion or malignancy (NILM)   HPVHIGH Negative 06/25/2019 1614   ADEQPAP  06/25/2019 1614    Satisfactory for evaluation; transformation zone component PRESENT.    High Risk HPV: Positive  Adequacy:  Satisfactory for evaluation, transformation zone component PRESENT  Diagnosis:  Atypical squamous cells of undetermined significance (ASC-US)  Last mammogram: 03/2021 birads 1   Review of Systems:  Pertinent items are noted in HPI. Comprehensive review of systems was otherwise negative.   Objective:  Physical Exam Wt (!) 357 lb 1.6 oz (162  kg)   LMP 05/21/2023   BMI 63.26 kg/m    Physical Exam Vitals and nursing note reviewed. Exam conducted with a chaperone present.  Constitutional:      Appearance: Normal appearance.  HENT:     Head: Normocephalic and atraumatic.  Pulmonary:     Effort: Pulmonary effort is normal.     Breath sounds: Normal breath sounds.  Genitourinary:    General: Normal vulva.     Exam position: Lithotomy position.     Vagina: Normal.     Cervix: Normal.     Comments: Unable to palpate cervix on single digit exam  Attempted to visualize cervix but appears to be very anterior and suspect  Skin:    General: Skin is warm and dry.  Neurological:     General: No focal deficit present.     Mental Status: She is alert.  Psychiatric:        Mood and Affect: Mood normal.        Behavior: Behavior normal.        Thought Content: Thought content normal.        Judgment: Judgment normal.   Endometrial Biopsy Procedure  Patient identified, informed consent performed,  indication reviewed, consent signed.  Reviewed risk of perforation, pain, bleeding, insufficient sample, etc were reviewd. Time out was performed.  Urine pregnancy test negative.  Speculum placed in the vagina.  Cervix was attempted to be visualized. Attempted bimanual but able to be fully palpate cervix, felt to be  likely anterior. Attempted visualization with different speculums and able to briefly view a lip of cervix, but not long enough to grasp with a tenaculum. After several moments, decision made to abort procedure.Patient tolerated procedure well.  Patient was given post-procedure instructions.   Labs and Imaging US Pelvis Complete  Result Date: 05/29/2023 CLINICAL DATA:  Vaginal bleeding with pelvic pain. EXAM: TRANSABDOMINAL AND TRANSVAGINAL ULTRASOUND OF PELVIS DOPPLER ULTRASOUND OF OVARIES TECHNIQUE: Both transabdominal and transvaginal ultrasound examinations of the pelvis were performed. Transabdominal technique was  performed for global imaging of the pelvis including uterus, ovaries, adnexal regions, and pelvic cul-de-sac. It was necessary to proceed with endovaginal exam following the transabdominal exam to visualize the . Color and duplex Doppler ultrasound was utilized to evaluate blood flow to the ovaries. COMPARISON:  12/18/2014 FINDINGS: Uterus Measurements: 11.0 x 3.2 x 2.9 cm = volume: 54 mL. No fibroids or other mass visualized.Uterus not well seen on transabdominal imaging due to bowel gas and not well seen on trans vaginal imaging due to positioning. Endometrium Thickness: 9 mm. No focal abnormality visualized. Potential blood products in the endometrial cavity. Right ovary Not visualized. Left ovary Not visualized. Pulsed Doppler evaluation of both ovaries demonstrates N/A . Other findings No abnormal free fluid. IMPRESSION: 1. Limited study due to bowel gas and positioning. 2. Endometrial stripe measures 9 mm in there may be some blood products in the endometrial canal. Close follow-up recommended. 3. Nonvisualization of the ovaries. Electronically Signed   By: Kennith Center M.D.   On: 05/29/2023 07:22   US Transvaginal Non-OB  Result Date: 05/29/2023 CLINICAL DATA:  Vaginal bleeding with pelvic pain. EXAM: TRANSABDOMINAL AND TRANSVAGINAL ULTRASOUND OF PELVIS DOPPLER ULTRASOUND OF OVARIES TECHNIQUE: Both transabdominal and transvaginal ultrasound examinations of the pelvis were performed. Transabdominal technique was performed for global imaging of the pelvis including uterus, ovaries, adnexal regions, and pelvic cul-de-sac. It was necessary to proceed with endovaginal exam following the transabdominal exam to visualize the . Color and duplex Doppler ultrasound was utilized to evaluate blood flow to the ovaries. COMPARISON:  12/18/2014 FINDINGS: Uterus Measurements: 11.0 x 3.2 x 2.9 cm = volume: 54 mL. No fibroids or other mass visualized.Uterus not well seen on transabdominal imaging due to bowel gas and not  well seen on trans vaginal imaging due to positioning. Endometrium Thickness: 9 mm. No focal abnormality visualized. Potential blood products in the endometrial cavity. Right ovary Not visualized. Left ovary Not visualized. Pulsed Doppler evaluation of both ovaries demonstrates N/A . Other findings No abnormal free fluid. IMPRESSION: 1. Limited study due to bowel gas and positioning. 2. Endometrial stripe measures 9 mm in there may be some blood products in the endometrial canal. Close follow-up recommended. 3. Nonvisualization of the ovaries. Electronically Signed   By: Kennith Center M.D.   On: 05/29/2023 07:22   [place] Art/Ven Flow Abd Pelv Doppler  Result Date: 05/29/2023 CLINICAL DATA:  Vaginal bleeding with pelvic pain. EXAM: TRANSABDOMINAL AND TRANSVAGINAL ULTRASOUND OF PELVIS DOPPLER ULTRASOUND OF OVARIES TECHNIQUE: Both transabdominal and transvaginal ultrasound examinations of the pelvis were performed. Transabdominal technique was performed for global imaging of the pelvis including uterus, ovaries, adnexal regions, and pelvic cul-de-sac. It was necessary to proceed with endovaginal exam following the transabdominal exam to visualize the . Color and duplex Doppler ultrasound was utilized to evaluate blood flow to the ovaries. COMPARISON:  12/18/2014 FINDINGS: Uterus Measurements: 11.0 x 3.2 x 2.9 cm = volume: 54 mL. No fibroids or other mass visualized.Uterus not well seen on  transabdominal imaging due to bowel gas and not well seen on trans vaginal imaging due to positioning. Endometrium Thickness: 9 mm. No focal abnormality visualized. Potential blood products in the endometrial cavity. Right ovary Not visualized. Left ovary Not visualized. Pulsed Doppler evaluation of both ovaries demonstrates N/A . Other findings No abnormal free fluid. IMPRESSION: 1. Limited study due to bowel gas and positioning. 2. Endometrial stripe measures 9 mm in there may be some blood products in the endometrial canal. Close  follow-up recommended. 3. Nonvisualization of the ovaries. Electronically Signed   By: Kennith Center M.D.   On: 05/29/2023 07:22       Assessment & Plan:   1. Abnormal uterine bleeding (AUB) Will get AUB labs today. Unable to visualize or palpate cervix on exam. Will plan for return on separate day for second attempt with different instrumentation. Given refill of megace to help control bleeding.  - Prolactin - Follicle stimulating hormone - Hemoglobin A1c - megestrol (MEGACE) 40 MG tablet; Take 2 tablets (80 mg total) by mouth daily. Can increase to two tablets twice a day in the event of heavy bleeding  Dispense: 60 tablet; Refill: 5   Routine preventative health maintenance measures emphasized.  Lorriane Shire, MD Minimally Invasive Gynecologic [facility], [facility]

## 2023-06-01 LAB — PROLACTIN: Prolactin: 9.9 ng/mL (ref 4.8–33.4)

## 2023-06-01 LAB — HEMOGLOBIN A1C
Est. average glucose Bld gHb Est-mCnc: 266 mg/dL
Hgb A1c MFr Bld: 10.9 % — ABNORMAL HIGH (ref 4.8–5.6)

## 2023-06-01 LAB — FOLLICLE STIMULATING HORMONE: FSH: 5 m[IU]/mL

## 2023-06-08 DIAGNOSIS — I10 Essential (primary) hypertension: Secondary | ICD-10-CM | POA: Diagnosis not present

## 2023-06-08 DIAGNOSIS — E1165 Type 2 diabetes mellitus with hyperglycemia: Secondary | ICD-10-CM | POA: Diagnosis not present

## 2023-06-08 DIAGNOSIS — N939 Abnormal uterine and vaginal bleeding, unspecified: Secondary | ICD-10-CM | POA: Diagnosis not present

## 2023-07-11 ENCOUNTER — Other Ambulatory Visit: Payer: Self-pay

## 2023-07-11 ENCOUNTER — Ambulatory Visit: Payer: 59 | Admitting: Obstetrics and Gynecology

## 2023-07-11 ENCOUNTER — Encounter: Payer: Self-pay | Admitting: Obstetrics and Gynecology

## 2023-07-11 ENCOUNTER — Other Ambulatory Visit (HOSPITAL_COMMUNITY)
Admission: RE | Admit: 2023-07-11 | Discharge: 2023-07-11 | Disposition: A | Discharge status: Home / Self Care | Payer: 59 | Source: Ambulatory Visit | Attending: Obstetrics and Gynecology | Admitting: Obstetrics and Gynecology

## 2023-07-11 VITALS — BP 135/87 | HR 90 | Wt 351.3 lb

## 2023-07-11 DIAGNOSIS — Z3202 Encounter for pregnancy test, result negative: Secondary | ICD-10-CM | POA: Diagnosis not present

## 2023-07-11 DIAGNOSIS — Z124 Encounter for screening for malignant neoplasm of cervix: Secondary | ICD-10-CM | POA: Diagnosis not present

## 2023-07-11 DIAGNOSIS — Z1331 Encounter for screening for depression: Secondary | ICD-10-CM

## 2023-07-11 DIAGNOSIS — N939 Abnormal uterine and vaginal bleeding, unspecified: Secondary | ICD-10-CM | POA: Diagnosis not present

## 2023-07-11 LAB — POCT PREGNANCY, URINE: Preg Test, Ur: NEGATIVE

## 2023-07-11 NOTE — Progress Notes (Signed)
GYNECOLOGY VISIT  Patient name: Rhonda Baldwin MRN [id number]  Date of birth: [birth year] Chief Complaint:   Endometrital biopsy   History:  Rhonda Baldwin is a 42 y.o. G1P1 being seen today for repeat EMB.  Has been taking megace which has helped to slow the bleeding down. Would be ok with getting a hysterectomy for definitive management.   DM not well controlled currently due to medications issues. First medication, wegovy, not always available. Now switched to monjouro and plan for repeat check in abut 2 months. Typically her A1c well controlled when taking her meds regularly.  Last A1c 10.9 on 10/6  Past Medical History:  Diagnosis Date   Diabetes mellitus    Hypertension    Thyroid disease     Past Surgical History:  Procedure Laterality Date   CESAREAN SECTION     CHOLECYSTECTOMY      The following portions of the patient's history were reviewed and updated as appropriate: allergies, current medications, past family history, past medical history, past social history, past surgical history and problem list.   Health Maintenance:   Last pap     Component Value Date/Time   DIAGPAP  06/25/2019 1614    - Negative for intraepithelial lesion or malignancy (NILM)   HPVHIGH Negative 06/25/2019 1614   ADEQPAP  06/25/2019 1614    Satisfactory for evaluation; transformation zone component PRESENT.    High Risk HPV: Positive  Adequacy:  Satisfactory for evaluation, transformation zone component PRESENT  Diagnosis:  Atypical squamous cells of undetermined significance (ASC-US)  Last mammogram: 03/2021 BIRADS 1   Review of Systems:  Pertinent items are noted in HPI. Comprehensive review of systems was otherwise negative.   Objective:  Physical Exam BP 135/87   Pulse 90   Wt (!) 351 lb 5 oz (159.4 kg)   LMP  (LMP Unknown)   BMI 62.23 kg/m    Physical Exam Vitals and nursing note reviewed. Exam conducted with a chaperone present.  Constitutional:       Appearance: Normal appearance.  HENT:     Head: Normocephalic and atraumatic.  Pulmonary:     Effort: Pulmonary effort is normal.     Breath sounds: Normal breath sounds.  Genitourinary:    General: Normal vulva.     Exam position: Lithotomy position.     Vagina: Normal.     Cervix: Normal.  Skin:    General: Skin is warm and dry.  Neurological:     General: No focal deficit present.     Mental Status: She is alert.  Psychiatric:        Mood and Affect: Mood normal.        Behavior: Behavior normal.        Thought Content: Thought content normal.        Judgment: Judgment normal.     Endometrial Biopsy Procedure  Patient identified, informed consent performed,  indication reviewed, consent signed.  Reviewed risk of perforation, pain, bleeding, insufficient sample, etc were reviewd. Time out was performed.  Urine pregnancy test negative.  Speculum placed in the vagina.  The cervix was not completed visualized but what appeared to be the end of a polyp was visualized and friable on exam. After discovery of polyp, decision made to abort attempt at EMB and discuss hysteroscopy in OR. Patient tolerated procedure well.  Patient was given post-procedure instructions.      Assessment & Plan:   1. Abnormal uterine bleeding (AUB) Suspect bleeding primarily due  to polyp. Discussed hysteroscopic removal in OR - offered IUD or ablation at the same time, declines. Noted that if bleeding does not improve with polypectomy, can proceed with more definitive management, assuming A1c is improved to less than 8. Continue megace for now.  - Ambulatory Referral For Surgery Scheduling  2. Pap smear for cervical cancer screening Pap collected - Cytology - PAP  Routine preventative health maintenance measures emphasized.  Lorriane Shire, MD Minimally Invasive Gynecologic Surgery Center for [facility], [facility]

## 2023-07-14 LAB — CYTOLOGY - PAP
Adequacy: ABSENT
Chlamydia: NEGATIVE
Comment: NEGATIVE
Comment: NEGATIVE
Comment: NEGATIVE
Comment: NORMAL
Diagnosis: NEGATIVE
High risk HPV: NEGATIVE
Neisseria Gonorrhea: NEGATIVE
Trichomonas: NEGATIVE

## 2023-07-27 ENCOUNTER — Telehealth: Payer: Self-pay

## 2023-07-27 NOTE — Telephone Encounter (Signed)
Patient called back  and would like to her surgery scheduled w/ Dr. Briscoe Deutscher at [facility] on 09/12/22 at 2:30 pm. Provided pre-op instructions over the phone. Written details will be sent to patients MyChart once surgery is scheduled.

## 2023-07-27 NOTE — Telephone Encounter (Signed)
Called patient to schedule surgery w/ Dr. Briscoe Deutscher on 09/06/23. Left voicemail asking patient to call me at [phone] to schedule.

## 2023-08-10 ENCOUNTER — Telehealth: Payer: Self-pay

## 2023-08-10 NOTE — Telephone Encounter (Signed)
Pt called requesting a refill on her Megace as she is now taking twice a day.    Leonette Nutting  08/10/23

## 2023-08-11 ENCOUNTER — Other Ambulatory Visit: Payer: Self-pay | Admitting: Lactation Services

## 2023-08-11 DIAGNOSIS — N939 Abnormal uterine and vaginal bleeding, unspecified: Secondary | ICD-10-CM

## 2023-08-11 MED ORDER — MEGESTROL ACETATE 40 MG PO TABS: 80.0000 mg | ORAL_TABLET | Freq: Two times a day (BID) | ORAL | 5 refills | Status: DC

## 2023-08-31 ENCOUNTER — Other Ambulatory Visit: Payer: Self-pay | Admitting: Pharmacist

## 2023-08-31 DIAGNOSIS — Z794 Long term (current) use of insulin: Secondary | ICD-10-CM

## 2023-08-31 NOTE — Progress Notes (Signed)
08/31/2023 Name: Rhonda Baldwin MRN: [id number] DOB: Sep 06, 1980  Chief Complaint  Patient presents with   Medication Management    Diabetes     Rhonda Baldwin is a 43 y.o. year old female who presented for a telephone visit.   They were referred to the pharmacist by a quality report --[employer] [place] [employer] for assistance in managing diabetes.    Subjective:  Care Team: Primary Care Provider: Harvest Forest, MD ; Next Scheduled Visit: 09/11/23-Rhonda Lenox Ponds, NP   Medication Access/Adherence  Current Pharmacy:  [id number] [place], [place] - [address] [place] [address] Phone: [phone] Fax: [phone]   Patient reports affordability concerns with their medications: No  Patient reports access/transportation concerns to their pharmacy: No  Patient reports adherence concerns with their medications:  Yes  Mounjaro-availability from Pharmacy   Diabetes:  Current medications:  Medications tried in the past:   Current glucose readings: Fasting yesterday 123mg /dl fasting today 132 mg/dl Using OneTouch Ultra meter; testing 1-2 times daily  Observed patterns:Patient was at work at the time of my call.    Patient reports hypoglycemic s/sx including fatigue, dizziness, shakiness, sweating. Patient denies hyperglycemic symptoms. She said she noticed feeling fatigued, nauseated and dizzy at work last week with the increase in [place].  She did not have her meter at work to check but felt like her blood sugar was low.  Current meal patterns:  Patient reported trying to eat healthier.  She said the [facility] is effective in decreasing her appetite so she feels like she eats less.   Objective:  Lab Results  Component Value Date   HGBA1C 10.9 (H) 05/31/2023    Lab Results  Component Value Date   CREATININE 0.79 11/05/2015   BUN 10 11/05/2015   NA 137 11/05/2015   K 4.1 11/05/2015   CL 100 (L) 11/05/2015    CO2 26 11/05/2015    Lab Results  Component Value Date   CHOL 152 12/15/2014   HDL 55 12/15/2014   LDLCALC 82 12/15/2014   TRIG 74 12/15/2014   CHOLHDL 2.8 12/15/2014    Medications Reviewed Today     Reviewed by Rhonda Baldwin, Rhonda Baldwin (Pharmacist) on 08/31/23 at [phone]  Med List Status: <None>   Medication Order Taking? Sig Documenting Provider Last Dose Status Informant  Alpha-Lipoic Acid 100 MG CAPS [id number] No Take 1 capsule every day by oral route. [provider] Unknown Active   ASPIRIN LOW DOSE 81 MG tablet [id number] Yes Take 81 mg by mouth daily. [provider] Taking Active   atenolol (TENORMIN) 50 MG tablet [id number] Yes Take 1 tablet by mouth daily. [provider] Taking Active   glimepiride (AMARYL) 2 MG tablet [id number] Yes TAKE 1 TABLET BY MOUTH ONCE  A DAY BEFORE MEALS [provider] Taking Active   glucose blood test strip [id number] Yes Take 1 strip twice a day by miscell. route for 90 days. [provider] Taking Active            Med Note Rhonda Baldwin, Rhonda Baldwin Aug 31, 2023  9:51 AM) One Touch Ultra  insulin NPH-regular Human (NOVOLIN 70/30) (70-30) 100 UNIT/ML injection [id number] Yes Inject 60 Units into the skin at bedtime. [provider] Taking Active   Lancets (FREESTYLE) lancets [id number] Yes Use as instructed Rhonda Angst, MD Taking Active   lisinopril-hydrochlorothiazide (PRINZIDE,ZESTORETIC) 20-25 MG tablet [id number] Yes Take 1 tablet daily by  mouth. Rhonda Bucco, MD Taking Active   loratadine (CLARITIN) 10 MG tablet [id number] No Take 1 tablet (10 mg total) by mouth daily as needed for allergies.  Patient not taking: Reported on 08/31/2023   Rhonda Angst, MD Not Taking Active   megestrol (MEGACE) 40 MG tablet [id number] Yes Take 2 tablets (80 mg total) by mouth 2 (two) times daily. Can increase to two tablets twice a day in the event of heavy bleeding Rhonda Bores, MD Taking Active    methimazole (TAPAZOLE) 10 MG tablet [id number] Yes Take 2 tablets (20 mg total) 2 (two) times daily by mouth. Rhonda Bucco, MD Taking Active   methocarbamol (ROBAXIN) 500 MG tablet [id number] No Take 1 tablet (500 mg total) by mouth 4 (four) times daily.  Patient not taking: Reported on 08/31/2023   Rhonda Baldwin Not Taking Active   MOUNJARO 7.5 MG/0.5ML Pen [id number] Yes Inject 7.5 mg into the skin once a week. [provider] Taking Active   naproxen sodium (ANAPROX) 220 MG tablet [id number] No Take 440 mg by mouth 2 (two) times daily with a meal. Reported on 08/11/2015  Patient not taking: Reported on 05/03/2023   [provider] Not Taking Active Self  rosuvastatin (CRESTOR) 20 MG tablet [id number] Yes Take by mouth. [provider] Taking Active   TRUEPLUS INSULIN SYRINGE 30G X 5/16" 0.5 ML MISC [id number] Yes USE AS DIRECTED WITH INSULIN Rhonda Angst, MD Taking Active   Vitamin D, Ergocalciferol, 50000 units CAPS [id number] Yes Take 1 capsule by mouth once a week. [provider] Taking Active               Assessment/Plan:   Diabetes: - Currently uncontrolled-HgA1c 10.9% - Reviewed goal A1c - Reviewed dietary modifications including decreasing portion sizes. -Patient was seen 08/14/23-Mounjaro dose was increased.  She is scheduled to see Provider Rhonda Baldwin on 09/11/23.     Follow Up Plan:  Follow up with Patient in 1 month following her next PCP appointment.   Rhonda Baldwin, PharmD, BCACP [facility] Clinical Pharmacist [phone]

## 2023-09-11 ENCOUNTER — Telehealth: Payer: Self-pay

## 2023-09-11 ENCOUNTER — Other Ambulatory Visit: Payer: Self-pay

## 2023-09-11 ENCOUNTER — Encounter (HOSPITAL_COMMUNITY): Payer: Self-pay | Admitting: Obstetrics and Gynecology

## 2023-09-11 DIAGNOSIS — E1165 Type 2 diabetes mellitus with hyperglycemia: Secondary | ICD-10-CM | POA: Diagnosis not present

## 2023-09-11 DIAGNOSIS — N939 Abnormal uterine and vaginal bleeding, unspecified: Secondary | ICD-10-CM | POA: Diagnosis not present

## 2023-09-11 DIAGNOSIS — I10 Essential (primary) hypertension: Secondary | ICD-10-CM | POA: Diagnosis not present

## 2023-09-11 NOTE — Telephone Encounter (Signed)
Called patient to confirm her arrival time for surgery on 09/13/23. Patient confirmed her arrival time is 12:30 pm and surgery begins at 2:30 pm.

## 2023-09-11 NOTE — Progress Notes (Signed)
PCP - NotiBakare, Mobolaji B, MD Cardiologist -   PPM/ICD - denies Device Orders - n/a Rep  - n/a  Chest x-ray - denies EKG - denies Stress Test - denies ECHO - denies Cardiac Cath - denies  CPAP - denies  GLP-1 -MOUNJARO 09-10-23  Fasting Blood Sugar - Per patient between 120-190 Checks Blood Sugar Daily  Blood Thinner Instructions: denies Aspirin Instructions: n/a  ERAS Protcol - NPO  COVID TEST- n/a  Anesthesia review: Yes last dose of mounjaro 09-10-23  Patient verbally denies any shortness of breath, fever, cough and chest pain during phone call   -------------  SDW INSTRUCTIONS given:  Your procedure is scheduled on September 13, 2023.  Report to [facility] "A" at 12:00 P M., and check in at the Admitting office.  Call this number if you have problems the morning of surgery:  [phone]   Remember:  Do not eat or drink  after midnight the night before your surgery    Take these medicines the morning of surgery with A SIP OF WATER  atenolol (TENORMIN)  loratadine (CLARITIN)  methimazole (TAPAZOLE)  rosuvastatin (CRESTOR)    WHAT DO I DO ABOUT MY DIABETES MEDICATION?  glimepiride (AMARYL) DO NOT TAKE EVENING DOSE Do not take oral diabetes medicines glimepiride (AMARYL) the morning of surgery.       THE NIGHT BEFORE SURGERY, take 42 units of NOVOLIN 70/30 insulin.        The day of surgery, do not take other diabetes injectables, including Byetta (exenatide), Bydureon (exenatide ER), Victoza (liraglutide), or Trulicity (dulaglutide).  If your CBG is greater than 220 mg/dL, you may take  of your sliding scale (correction) dose of insulin.   HOW TO MANAGE YOUR DIABETES BEFORE AND AFTER SURGERY  Why is it important to control my blood sugar before and after surgery? Improving blood sugar levels before and after surgery helps healing and can limit problems. A way of improving blood sugar control is eating a healthy diet by:  Eating less  sugar and carbohydrates  Increasing activity/exercise  Talking with your doctor about reaching your blood sugar goals High blood sugars (greater than 180 mg/dL) can raise your risk of infections and slow your recovery, so you will need to focus on controlling your diabetes during the weeks before surgery. Make sure that the doctor who takes care of your diabetes knows about your planned surgery including the date and location.  How do I manage my blood sugar before surgery? Check your blood sugar at least 4 times a day, starting 2 days before surgery, to make sure that the level is not too high or low.  Check your blood sugar the morning of your surgery when you wake up and every 2 hours until you get to the Short Stay unit.  If your blood sugar is less than 70 mg/dL, you will need to treat for low blood sugar: Do not take insulin. Treat a low blood sugar (less than 70 mg/dL) with  cup of clear juice (cranberry or apple), 4 glucose tablets, OR glucose gel. Recheck blood sugar in 15 minutes after treatment (to make sure it is greater than 70 mg/dL). If your blood sugar is not greater than 70 mg/dL on recheck, call [phone] for further instructions. Report your blood sugar to the short stay nurse when you get to Short Stay.  If you are admitted to the hospital after surgery: Your blood sugar will be checked by the staff and you will  probably be given insulin after surgery (instead of oral diabetes medicines) to make sure you have good blood sugar levels. The goal for blood sugar control after surgery is 80-180 mg/dL.   As of today, STOP taking any Aspirin (unless otherwise instructed by your surgeon) Aleve, Naproxen, Ibuprofen, Motrin, Advil, Goody's, BC's, all herbal medications, fish oil, and all vitamins.                      Do not wear jewelry, make up, or nail polish            Do not wear lotions, powders, perfumes/colognes, or deodorant.            Do not shave 48 hours prior to  surgery.  Men may shave face and neck.            Do not bring valuables to the hospital.            [facility] is not responsible for any belongings or valuables.  Do NOT Smoke (Tobacco/Vaping) 24 hours prior to your procedure If you use a CPAP at night, you may bring all equipment for your overnight stay.   Contacts, glasses, dentures or bridgework may not be worn into surgery.      For patients admitted to the hospital, discharge time will be determined by your treatment team.   Patients discharged the day of surgery will not be allowed to drive home, and someone needs to stay with them for 24 hours.    Special instructions:   [place]- Preparing For Surgery  Before surgery, you can play an important role. Because skin is not sterile, your skin needs to be as free of germs as possible. You can reduce the number of germs on your skin by washing with CHG (chlorahexidine gluconate) Soap before surgery.  CHG is an antiseptic cleaner which kills germs and bonds with the skin to continue killing germs even after washing.    Oral Hygiene is also important to reduce your risk of infection.  Remember - BRUSH YOUR TEETH THE MORNING OF SURGERY WITH YOUR REGULAR TOOTHPASTE  Please do not use if you have an allergy to CHG or antibacterial soaps. If your skin becomes reddened/irritated stop using the CHG.  Do not shave (including legs and underarms) for at least 48 hours prior to first CHG shower. It is OK to shave your face.  Please follow these instructions carefully.   Shower the NIGHT BEFORE SURGERY and the MORNING OF SURGERY with DIAL Soap.   Pat yourself dry with a CLEAN TOWEL.  Wear CLEAN PAJAMAS to bed the night before surgery  Place CLEAN SHEETS on your bed the night of your first shower and DO NOT SLEEP WITH PETS.   Day of Surgery: Please shower morning of surgery  Wear Clean/Comfortable clothing the morning of surgery Do not apply any deodorants/lotions.   Remember to  brush your teeth WITH YOUR REGULAR TOOTHPASTE.   Questions were answered. Patient verbalized understanding of instructions.

## 2023-09-13 ENCOUNTER — Other Ambulatory Visit: Payer: Self-pay

## 2023-09-13 ENCOUNTER — Ambulatory Visit (HOSPITAL_COMMUNITY)
Admission: RE | Admit: 2023-09-13 | Discharge: 2023-09-13 | Disposition: A | Discharge status: Home / Self Care | Payer: No Typology Code available for payment source | Attending: Obstetrics and Gynecology | Admitting: Obstetrics and Gynecology

## 2023-09-13 ENCOUNTER — Ambulatory Visit (HOSPITAL_COMMUNITY): Payer: No Typology Code available for payment source | Admitting: Anesthesiology

## 2023-09-13 ENCOUNTER — Ambulatory Visit (HOSPITAL_BASED_OUTPATIENT_CLINIC_OR_DEPARTMENT_OTHER): Payer: No Typology Code available for payment source | Admitting: Anesthesiology

## 2023-09-13 ENCOUNTER — Encounter (HOSPITAL_COMMUNITY)
Admission: RE | Disposition: A | Discharge status: Home / Self Care | Payer: Self-pay | Source: Home / Self Care | Attending: Obstetrics and Gynecology

## 2023-09-13 ENCOUNTER — Encounter (HOSPITAL_COMMUNITY): Payer: Self-pay | Admitting: Obstetrics and Gynecology

## 2023-09-13 DIAGNOSIS — N841 Polyp of cervix uteri: Secondary | ICD-10-CM | POA: Diagnosis not present

## 2023-09-13 DIAGNOSIS — N939 Abnormal uterine and vaginal bleeding, unspecified: Secondary | ICD-10-CM

## 2023-09-13 DIAGNOSIS — Z794 Long term (current) use of insulin: Secondary | ICD-10-CM | POA: Diagnosis not present

## 2023-09-13 DIAGNOSIS — E6689 Other obesity not elsewhere classified: Secondary | ICD-10-CM | POA: Insufficient documentation

## 2023-09-13 DIAGNOSIS — N924 Excessive bleeding in the premenopausal period: Secondary | ICD-10-CM

## 2023-09-13 DIAGNOSIS — I10 Essential (primary) hypertension: Secondary | ICD-10-CM | POA: Insufficient documentation

## 2023-09-13 DIAGNOSIS — Z79899 Other long term (current) drug therapy: Secondary | ICD-10-CM | POA: Insufficient documentation

## 2023-09-13 DIAGNOSIS — E119 Type 2 diabetes mellitus without complications: Secondary | ICD-10-CM | POA: Diagnosis not present

## 2023-09-13 DIAGNOSIS — N938 Other specified abnormal uterine and vaginal bleeding: Secondary | ICD-10-CM

## 2023-09-13 DIAGNOSIS — N72 Inflammatory disease of cervix uteri: Secondary | ICD-10-CM | POA: Insufficient documentation

## 2023-09-13 DIAGNOSIS — E039 Hypothyroidism, unspecified: Secondary | ICD-10-CM

## 2023-09-13 DIAGNOSIS — N840 Polyp of corpus uteri: Secondary | ICD-10-CM

## 2023-09-13 DIAGNOSIS — Z7984 Long term (current) use of oral hypoglycemic drugs: Secondary | ICD-10-CM | POA: Insufficient documentation

## 2023-09-13 HISTORY — PX: HYSTEROSCOPY WITH D & C: SHX1775

## 2023-09-13 LAB — GLUCOSE, CAPILLARY
Glucose-Capillary: 219 mg/dL — ABNORMAL HIGH (ref 70–99)
Glucose-Capillary: 154 mg/dL — ABNORMAL HIGH (ref 70–99)
Glucose-Capillary: 119 mg/dL — ABNORMAL HIGH (ref 70–99)
Glucose-Capillary: 98 mg/dL (ref 70–99)

## 2023-09-13 LAB — CBC
HCT: 40 % (ref 36.0–46.0)
Hemoglobin: 12.7 g/dL (ref 12.0–15.0)
MCH: 27.1 pg (ref 26.0–34.0)
MCHC: 31.8 g/dL (ref 30.0–36.0)
MCV: 85.3 fL (ref 80.0–100.0)
Platelets: 237 10*3/uL (ref 150–400)
RBC: 4.69 MIL/uL (ref 3.87–5.11)
RDW: 14.8 % (ref 11.5–15.5)
WBC: 8.3 10*3/uL (ref 4.0–10.5)
nRBC: 0 % (ref 0.0–0.2)

## 2023-09-13 LAB — BASIC METABOLIC PANEL
Anion gap: 12 (ref 5–15)
BUN: 22 mg/dL — ABNORMAL HIGH (ref 6–20)
CO2: 20 mmol/L — ABNORMAL LOW (ref 22–32)
Calcium: 9.6 mg/dL (ref 8.9–10.3)
Chloride: 102 mmol/L (ref 98–111)
Creatinine, Ser: 1.04 mg/dL — ABNORMAL HIGH (ref 0.44–1.00)
GFR, Estimated: >60 mL/min (ref >60)
Glucose, Bld: 184 mg/dL — ABNORMAL HIGH (ref 70–99)
Potassium: 4.4 mmol/L (ref 3.5–5.1)
Sodium: 134 mmol/L — ABNORMAL LOW (ref 135–145)

## 2023-09-13 LAB — POCT PREGNANCY, URINE: Preg Test, Ur: NEGATIVE

## 2023-09-13 SURGERY — DILATATION AND CURETTAGE /HYSTEROSCOPY
Anesthesia: General | Site: Cervix

## 2023-09-13 MED ORDER — FENTANYL CITRATE (PF) 100 MCG/2ML IJ SOLN
25.0000 ug | INTRAMUSCULAR | Status: DC | PRN
  Administered 2023-09-13: 50 ug via INTRAVENOUS

## 2023-09-13 MED ORDER — OXYCODONE HCL 5 MG PO TABS: 5.0000 mg | ORAL_TABLET | Freq: Once | ORAL | Status: AC | PRN

## 2023-09-13 MED ORDER — MIDAZOLAM HCL 2 MG/2ML IJ SOLN
INTRAMUSCULAR | Status: AC
  Filled 2023-09-13: qty 2

## 2023-09-13 MED ORDER — BUPIVACAINE HCL (PF) 0.5 % IJ SOLN
INTRAMUSCULAR | Status: DC | PRN
  Administered 2023-09-13: 20 mL

## 2023-09-13 MED ORDER — SUCCINYLCHOLINE CHLORIDE 200 MG/10ML IV SOSY
PREFILLED_SYRINGE | INTRAVENOUS | Status: DC | PRN
  Administered 2023-09-13: 200 mg via INTRAVENOUS

## 2023-09-13 MED ORDER — ACETAMINOPHEN 500 MG PO TABS
1000.0000 mg | ORAL_TABLET | ORAL | Status: AC
  Filled 2023-09-13: qty 2

## 2023-09-13 MED ORDER — FENTANYL CITRATE (PF) 100 MCG/2ML IJ SOLN
INTRAMUSCULAR | Status: AC
  Filled 2023-09-13: qty 2

## 2023-09-13 MED ORDER — LIDOCAINE 2% (20 MG/ML) 5 ML SYRINGE
INTRAMUSCULAR | Status: AC
Start: 2023-09-13 — End: ?
  Filled 2023-09-13: qty 5

## 2023-09-13 MED ORDER — LACTATED RINGERS IV SOLN: INTRAVENOUS | Status: DC

## 2023-09-13 MED ORDER — FENTANYL CITRATE (PF) 250 MCG/5ML IJ SOLN
INTRAMUSCULAR | Status: DC | PRN
  Administered 2023-09-13: 100 ug via INTRAVENOUS

## 2023-09-13 MED ORDER — MIDAZOLAM HCL 2 MG/2ML IJ SOLN
INTRAMUSCULAR | Status: DC | PRN
  Administered 2023-09-13: 2 mg via INTRAVENOUS

## 2023-09-13 MED ORDER — PROPOFOL 10 MG/ML IV BOLUS
INTRAVENOUS | Status: AC
  Filled 2023-09-13: qty 20

## 2023-09-13 MED ORDER — ROCURONIUM BROMIDE 10 MG/ML (PF) SYRINGE
PREFILLED_SYRINGE | INTRAVENOUS | Status: DC | PRN
  Administered 2023-09-13: 40 mg via INTRAVENOUS

## 2023-09-13 MED ORDER — ONDANSETRON HCL 4 MG/2ML IJ SOLN
INTRAMUSCULAR | Status: DC | PRN
  Administered 2023-09-13: 4 mg via INTRAVENOUS

## 2023-09-13 MED ORDER — PHENYLEPHRINE 80 MCG/ML (10ML) SYRINGE FOR IV PUSH (FOR BLOOD PRESSURE SUPPORT)
PREFILLED_SYRINGE | INTRAVENOUS | Status: AC
  Filled 2023-09-13: qty 10

## 2023-09-13 MED ORDER — OXYCODONE HCL 5 MG/5ML PO SOLN
ORAL | Status: AC
  Filled 2023-09-13: qty 5

## 2023-09-13 MED ORDER — ORAL CARE MOUTH RINSE: 15.0000 mL | Freq: Once | OROMUCOSAL | Status: AC

## 2023-09-13 MED ORDER — SUCCINYLCHOLINE CHLORIDE 200 MG/10ML IV SOSY
PREFILLED_SYRINGE | INTRAVENOUS | Status: AC
Start: 2023-09-13 — End: ?
  Filled 2023-09-13: qty 10

## 2023-09-13 MED ORDER — KETOROLAC TROMETHAMINE 30 MG/ML IJ SOLN
INTRAMUSCULAR | Status: DC | PRN
  Administered 2023-09-13: 30 mg via INTRAVENOUS

## 2023-09-13 MED ORDER — ACETAMINOPHEN 500 MG PO TABS
ORAL_TABLET | ORAL | Status: AC
  Administered 2023-09-13: 1000 mg via ORAL
  Filled 2023-09-13: qty 2

## 2023-09-13 MED ORDER — ACETAMINOPHEN 500 MG PO TABS: 500.0000 mg | ORAL_TABLET | Freq: Four times a day (QID) | ORAL | 0 refills | Status: AC | PRN

## 2023-09-13 MED ORDER — LIDOCAINE 2% (20 MG/ML) 5 ML SYRINGE
INTRAMUSCULAR | Status: DC | PRN
  Administered 2023-09-13: 100 mg via INTRAVENOUS

## 2023-09-13 MED ORDER — KETOROLAC TROMETHAMINE 30 MG/ML IJ SOLN
INTRAMUSCULAR | Status: AC
  Filled 2023-09-13: qty 1

## 2023-09-13 MED ORDER — SODIUM CHLORIDE 0.9 % IR SOLN
Status: DC | PRN
  Administered 2023-09-13: 1

## 2023-09-13 MED ORDER — CHLORHEXIDINE GLUCONATE 0.12 % MT SOLN
OROMUCOSAL | Status: AC
  Administered 2023-09-13: 15 mL via OROMUCOSAL
  Filled 2023-09-13: qty 15

## 2023-09-13 MED ORDER — ONDANSETRON HCL 4 MG/2ML IJ SOLN
INTRAMUSCULAR | Status: AC
  Filled 2023-09-13: qty 2

## 2023-09-13 MED ORDER — FENTANYL CITRATE (PF) 250 MCG/5ML IJ SOLN
INTRAMUSCULAR | Status: AC
  Filled 2023-09-13: qty 5

## 2023-09-13 MED ORDER — PHENYLEPHRINE 80 MCG/ML (10ML) SYRINGE FOR IV PUSH (FOR BLOOD PRESSURE SUPPORT)
PREFILLED_SYRINGE | INTRAVENOUS | Status: DC | PRN
  Administered 2023-09-13 (×2): 80 ug via INTRAVENOUS

## 2023-09-13 MED ORDER — LACTATED RINGERS IV SOLN
INTRAVENOUS | Status: DC
Start: 2023-09-13 — End: 2023-09-14

## 2023-09-13 MED ORDER — MEGESTROL ACETATE 40 MG PO TABS: 80.0000 mg | ORAL_TABLET | Freq: Two times a day (BID) | ORAL | 5 refills | Status: DC | PRN

## 2023-09-13 MED ORDER — SUGAMMADEX SODIUM 200 MG/2ML IV SOLN
INTRAVENOUS | Status: DC | PRN
  Administered 2023-09-13: 400 mg via INTRAVENOUS

## 2023-09-13 MED ORDER — ROCURONIUM BROMIDE 10 MG/ML (PF) SYRINGE
PREFILLED_SYRINGE | INTRAVENOUS | Status: AC
  Filled 2023-09-13: qty 10

## 2023-09-13 MED ORDER — SODIUM CHLORIDE 0.9 % IV SOLN: 12.5000 mg | INTRAVENOUS | Status: DC | PRN

## 2023-09-13 MED ORDER — PROPOFOL 10 MG/ML IV BOLUS
INTRAVENOUS | Status: DC | PRN
  Administered 2023-09-13: 200 mg via INTRAVENOUS

## 2023-09-13 MED ORDER — OXYCODONE HCL 5 MG/5ML PO SOLN
5.0000 mg | Freq: Once | ORAL | Status: AC | PRN
  Administered 2023-09-13: 5 mg via ORAL

## 2023-09-13 MED ORDER — INSULIN ASPART 100 UNIT/ML IJ SOLN
0.0000 [IU] | INTRAMUSCULAR | Status: DC | PRN
  Administered 2023-09-13: 4 [IU] via SUBCUTANEOUS
  Filled 2023-09-13: qty 1

## 2023-09-13 MED ORDER — CHLORHEXIDINE GLUCONATE 0.12 % MT SOLN
15.0000 mL | Freq: Once | OROMUCOSAL | Status: AC
Start: 2023-09-13 — End: 2023-09-13
  Filled 2023-09-13: qty 15

## 2023-09-13 MED ORDER — AMISULPRIDE (ANTIEMETIC) 5 MG/2ML IV SOLN: 10.0000 mg | Freq: Once | INTRAVENOUS | Status: DC | PRN

## 2023-09-13 SURGICAL SUPPLY — 17 items
CATH ROBINSON RED A/P 16FR (CATHETERS) IMPLANT
CNTNR URN SCR LID CUP LEK RST (MISCELLANEOUS) IMPLANT
DEVICE MYOSURE LITE (MISCELLANEOUS) IMPLANT
DEVICE MYOSURE REACH (MISCELLANEOUS) IMPLANT
GAUZE 4X4 16PLY ~~LOC~~+RFID DBL (SPONGE) IMPLANT
GLOVE BIOGEL PI IND STRL 7.0 (GLOVE) IMPLANT
GLOVE ECLIPSE 7.0 STRL STRAW (GLOVE) ×1 IMPLANT
GOWN STRL REUS W/TWL LRG LVL3 (GOWN DISPOSABLE) ×1 IMPLANT
GOWN STRL REUS W/TWL XL LVL3 (GOWN DISPOSABLE) ×1 IMPLANT
KIT PROCEDURE FLUENT (KITS) ×1 IMPLANT
KIT TURNOVER KIT A (KITS) ×1 IMPLANT
MYOSURE XL FIBROID (MISCELLANEOUS) ×1 IMPLANT
PACK VAGINAL MINOR WOMEN LF (CUSTOM PROCEDURE TRAY) ×1 IMPLANT
PAD OB MATERNITY 4.3X12.25 (PERSONAL CARE ITEMS) ×1 IMPLANT
SEAL CERVICAL OMNI LOK (ABLATOR) IMPLANT
SEAL ROD LENS SCOPE MYOSURE (ABLATOR) ×1 IMPLANT
SYSTEM TISS REMOVAL MYOSURE XL (MISCELLANEOUS) IMPLANT

## 2023-09-13 NOTE — Anesthesia Preprocedure Evaluation (Addendum)
Anesthesia Evaluation  Patient identified by MRN, date of birth, ID band Patient awake    Reviewed: Allergy & Precautions, NPO status , Patient's Chart, lab work & pertinent test results, reviewed documented beta blocker date and time   History of Anesthesia Complications Negative for: history of anesthetic complications  Airway Mallampati: II  TM Distance: >3 FB Neck ROM: Full    Dental  (+) Dental Advisory Given, Teeth Intact   Pulmonary neg pulmonary ROS   Pulmonary exam normal        Cardiovascular hypertension, Pt. on medications and Pt. on home beta blockers Normal cardiovascular exam     Neuro/Psych negative neurological ROS  negative psych ROS   GI/Hepatic negative GI ROS, Neg liver ROS,,,  Endo/Other  diabetes, Type 2, Insulin Dependent, Oral Hypoglycemic Agents Hyperthyroidism Class 4 obesity  Renal/GU negative Renal ROS     Musculoskeletal negative musculoskeletal ROS (+)    Abdominal  (+) + obese  Peds  Hematology negative hematology ROS (+)   Anesthesia Other Findings On GLP-1a   Reproductive/Obstetrics                             Anesthesia Physical Anesthesia Plan  ASA: 3  Anesthesia Plan: General   Post-op Pain Management: Tylenol PO (pre-op)* and Minimal or no pain anticipated   Induction: Intravenous and Rapid sequence  PONV Risk Score and Plan: 3 and Treatment may vary due to age or medical condition, Ondansetron, Dexamethasone and Midazolam  Airway Management Planned: Oral ETT and Video Laryngoscope Planned  Additional Equipment: None  Intra-op Plan:   Post-operative Plan: Extubation in OR  Informed Consent: I have reviewed the patients History and Physical, chart, labs and discussed the procedure including the risks, benefits and alternatives for the proposed anesthesia with the patient or authorized representative who has indicated his/her understanding  and acceptance.     Dental advisory given  Plan Discussed with: CRNA and Anesthesiologist  Anesthesia Plan Comments:        Anesthesia Quick Evaluation

## 2023-09-13 NOTE — H&P (Signed)
OB/GYN Pre-Op History and Physical  Rhonda Baldwin is a 43 y.o. G1P1 presenting for surgical management of endometrial polyp and abnormal uterine bleeding.       Past Medical History:  Diagnosis Date   Diabetes mellitus    Hypertension    Thyroid disease     Past Surgical History:  Procedure Laterality Date   CESAREAN SECTION     CHOLECYSTECTOMY      OB History  Gravida Para Term Preterm AB Living  1 1    1   SAB IAB Ectopic Multiple Live Births      1    # Outcome Date GA Lbr Len/2nd Weight Sex Type Anes PTL Lv  1 Para 2001     CS-Unspec       Social History   Socioeconomic History   Marital status: Divorced    Spouse name: Not on file   Number of children: Not on file   Years of education: Not on file   Highest education level: Not on file  Occupational History   Not on file  Tobacco Use   Smoking status: Never   Smokeless tobacco: Never  Vaping Use   Vaping status: Never Used  Substance and Sexual Activity   Alcohol use: No   Drug use: No   Sexual activity: Yes    Birth control/protection: None  Other Topics Concern   Not on file  Social History Narrative   Not on file   Social Drivers of Health   Financial Resource Strain: Not on file  Food Insecurity: Not on file  Transportation Needs: Not on file  Physical Activity: Not on file  Stress: Not on file  Social Connections: Not on file    Family History  Problem Relation Age of Onset   Hypertension Mother    Hypertension Father     Medications Prior to Admission  Medication Sig Dispense Refill Last Dose/Taking   Alpha-Lipoic Acid 100 MG CAPS Take 100 mg by mouth daily.   09/12/2023   atenolol (TENORMIN) 50 MG tablet Take 50 mg by mouth daily.   09/13/2023 at  8:00 AM   glimepiride (AMARYL) 2 MG tablet TAKE 1 TABLET BY MOUTH ONCE  A DAY BEFORE MEALS   09/12/2023   insulin NPH-regular Human (NOVOLIN 70/30) (70-30) 100 UNIT/ML injection Inject 60 Units into the skin at bedtime.   09/12/2023    lisinopril-hydrochlorothiazide (PRINZIDE,ZESTORETIC) 20-25 MG tablet Take 1 tablet daily by mouth. 30 tablet 1 09/12/2023   loratadine (CLARITIN) 10 MG tablet Take 1 tablet (10 mg total) by mouth daily as needed for allergies. 30 tablet 3 09/13/2023 at  8:00 AM   megestrol (MEGACE) 40 MG tablet Take 2 tablets (80 mg total) by mouth 2 (two) times daily. Can increase to two tablets twice a day in the event of heavy bleeding 60 tablet 5 09/13/2023 at  8:00 AM   methimazole (TAPAZOLE) 5 MG tablet Take 5 mg by mouth daily.   09/13/2023 at  8:00 AM   MOUNJARO 7.5 MG/0.5ML Pen Inject 7.5 mg into the skin once a week.   09/10/2023   naproxen sodium (ALEVE) 220 MG tablet Take 440 mg by mouth 2 (two) times daily as needed (pain). Reported on 08/11/2015   09/12/2023   rosuvastatin (CRESTOR) 20 MG tablet Take 20 mg by mouth daily.   09/13/2023 at  8:00 AM   glucose blood test strip Take 1 strip twice a day by miscell. route for 90 days.  Lancets (FREESTYLE) lancets Use as instructed 100 each 12    TRUEPLUS INSULIN SYRINGE 30G X 5/16" 0.5 ML MISC USE AS DIRECTED WITH INSULIN 100 each 5     Allergies  Allergen Reactions   Metformin And Related Diarrhea   Orange Swelling    oranges    Review of Systems: Negative except for what is mentioned in HPI.     Physical Exam: BP (!) 106/44 (BP Location: Right Arm) Comment: RN Notified  Pulse 84   Temp 98.4 F (36.9 C) (Oral)   Resp 18   Ht 5\' 3"  (1.6 m)   Wt (!) 156.5 kg   SpO2 99%   BMI 61.11 kg/m  CONSTITUTIONAL: Well-developed, well-nourished and in no acute distress.  HENT:  Normocephalic, atraumatic, External right and left ear normal. Oropharynx is clear and moist EYES: Conjunctivae and EOM are normal. Pupils are equal, round, and reactive to light. No scleral icterus.  NECK: Normal range of motion, supple, no masses SKIN: Skin is warm and dry. No rash noted. Not diaphoretic. No erythema. No pallor. NEUROLGIC: Alert and oriented to person, place,  and time. Normal reflexes, muscle tone coordination. No cranial nerve deficit noted. PSYCHIATRIC: Normal mood and affect. Normal behavior. Normal judgment and thought content. RESPIRATORY: Normal effort PELVIC: Deferred   Pertinent Labs/Studies:   Results for orders placed or performed during the hospital encounter of 09/13/23 (from the past 72 hours)  Glucose, capillary     Status: Abnormal   Collection Time: 09/13/23 12:15 PM  Result Value Ref Range   Glucose-Capillary 219 (H) 70 - 99 mg/dL    Comment: Glucose reference range applies only to samples taken after fasting for at least 8 hours.   Comment 1 Notify RN    Comment 2 Document in Chart        Assessment and Plan :Rhonda Baldwin is a 43 y.o. G1P1 here for surgical management of endometrial polyp and abnormal uterine bleeding..   Patient desires surgical management with operative hysteroscopy.  The risks of surgery were discussed in detail with the patient including but not limited to: bleeding which may require transfusion or reoperation; infection which may require prolonged hospitalization or re-hospitalization and antibiotic therapy; injury to bowel, bladder, ureters and major vessels or other surrounding organs which may lead to other procedures; formation of adhesions; need for additional procedures including laparotomy or subsequent procedures secondary to intraoperative injury or abnormal pathology; thromboembolic phenomenon; incisional problems and other postoperative or anesthesia complications.  Patient was told that the likelihood that her condition and symptoms will be treated effectively with this surgical management was high; the postoperative expectations were also discussed in detail. The patient also understands the alternative treatment options which were discussed in full. All questions were answered.    Lorriane Shire, M.D. Minimally Invasive Gynecologic Surgery and Pelvic Pain Specialist Attending  Obstetrician & Gynecologist, Faculty Practice Center for [employer], [facility]

## 2023-09-13 NOTE — Transfer of Care (Signed)
Immediate Anesthesia Transfer of Care Note  Patient: Rhonda Baldwin  Procedure(s) Performed: DILATATION AND CURETTAGE /HYSTEROSCOPY AND POLYP RESECTION (Cervix)  Patient Location: PACU  Anesthesia Type:General  Level of Consciousness: awake, alert , and patient cooperative  Airway & Oxygen Therapy: Patient Spontanous Breathing and Patient connected to face mask oxygen  Post-op Assessment: Report given to RN and Post -op Vital signs reviewed and stable  Post vital signs: Reviewed and stable  Last Vitals:  Vitals Value Taken Time  BP 132/85 09/13/23 1802  Temp    Pulse 88 09/13/23 1804  Resp 17 09/13/23 1804  SpO2 100 % 09/13/23 1804  Vitals shown include unfiled device data.  Last Pain:  Vitals:   09/13/23 1236  TempSrc:   PainSc: 0-No pain         Complications: No notable events documented.

## 2023-09-13 NOTE — Discharge Instructions (Signed)
Post-surgical Instructions, Outpatient Surgery  You may expect to feel dizzy, weak, and drowsy for as long as 24 hours after receiving the medicine that made you sleep (anesthetic). For the first 24 hours after your surgery:   Do not drive a car, ride a bicycle, participate in physical activities, or take public transportation until you are done taking narcotic pain medicines or as directed by Dr. Briscoe Deutscher.  Do not drink alcohol or take tranquilizers.  Do not take medicine that has not been prescribed by your physicians.  Do not sign important papers or make important decisions while on narcotic pain medicines.  Have a responsible person with you.    PAIN MANAGEMENT Ibuprofen 800mg .  (This is the same as 4-200mg  over the counter tablets of Motrin or ibuprofen.)  Take this every 6 hours or as needed for cramping.  OR your usual naproxen Acetaminophen 1000mg  (This is the same as 2-500mg  over the counter extra strength tylenol). Take this every 6 hours for the first 3 days or as needed afterwards for pain  DO'S AND DON'T'S Do not take a tub bath for 2 weeks.  You may shower on the first day after your surgery Do not do any heavy lifting for one to two weeks.  This increases the chance of bleeding. Do move around as you feel able.  Stairs are fine.  You may begin to exercise again as you feel able.  Do not lift any weights for two weeks. Do not put anything in the vagina for two weeks--no tampons, intercourse, or douching.    REGULAR MEDIATIONS/VITAMINS: You may restart all of your regular medications as prescribed. You may restart all of your vitamins as you normally take them.    PLEASE CALL OR SEEK MEDICAL CARE IF: You have persistent nausea and vomiting.  You have trouble eating or drinking.  You have an oral temperature above 100.5.  You have constipation that is not helped by adjusting diet or increasing fluid intake. Pain medicines are a common cause of constipation.  You have heavy  vaginal bleeding You have redness or drainage from your incision(s) or there is increasing pain or tenderness near or in the surgical site.

## 2023-09-13 NOTE — Brief Op Note (Signed)
09/13/2023  5:49 PM  PATIENT:  Rhonda Baldwin  43 y.o. female  PRE-OPERATIVE DIAGNOSIS:  Abnormal uterine bleeding Polyp  POST-OPERATIVE DIAGNOSIS:  Abnormal uterine bleeding Polyp  PROCEDURE:  Procedure(s): DILATATION AND CURETTAGE /HYSTEROSCOPY AND POLYP RESECTION (N/A)  SURGEON:  Surgeons and Role:    Lorriane Shire, MD - Primary  PHYSICIAN ASSISTANT: n/a  ASSISTANTS: none   ANESTHESIA:   general and paracervical block  EBL:  15 mL   BLOOD ADMINISTERED:none  DRAINS: none   LOCAL MEDICATIONS USED:  BUPIVICAINE   SPECIMEN:  Source of Specimen:  endocervical polyp, endometrial curettings  DISPOSITION OF SPECIMEN:  PATHOLOGY  COUNTS:  YES  TOURNIQUET:  * No tourniquets in log *  DICTATION: .Note written in EPIC  PLAN OF CARE: Discharge to home after PACU  PATIENT DISPOSITION:  PACU - hemodynamically stable.   Delay start of Pharmacological VTE agent (>24hrs) due to surgical blood loss or risk of bleeding: not applicable

## 2023-09-13 NOTE — Anesthesia Procedure Notes (Signed)
Procedure Name: Intubation Date/Time: 09/13/2023 5:13 PM  Performed by: Eulah Pont, CRNAPre-anesthesia Checklist: Patient identified, Emergency Drugs available, Suction available and Patient being monitored Patient Re-evaluated:Patient Re-evaluated prior to induction Oxygen Delivery Method: Circle System Utilized Preoxygenation: Pre-oxygenation with 100% oxygen Induction Type: IV induction and Rapid sequence Laryngoscope Size: Glidescope and 3 Grade View: Grade II Tube type: Oral Tube size: 7.0 mm Number of attempts: 1 Airway Equipment and Method: Video-laryngoscopy and Rigid stylet (ramped with blankets) Placement Confirmation: ETT inserted through vocal cords under direct vision, positive ETCO2 and breath sounds checked- equal and bilateral Secured at: 21 cm Tube secured with: Tape Dental Injury: Teeth and Oropharynx as per pre-operative assessment

## 2023-09-13 NOTE — Op Note (Incomplete)
Rhonda Baldwin PROCEDURE DATE: 09/13/2023  PREOPERATIVE DIAGNOSIS: abnormal uterine bleeding, polyp  POSTOPERATIVE DIAGNOSIS: same PROCEDURE:  polypectomy, myosure resection, dilation and curettage SURGEON: Lorriane Shire, MD ASSISTANT:  none  INDICATIONS: 43 y.o. G1P1 with AUB.  Risks of surgery were discussed with the patient including but not limited to: bleeding which may require transfusion; infection which may require antibiotics; injury to surrounding organs; need for additional procedures including laparotomy;  and other postoperative/anesthesia complications. Written informed consent was obtained.    FINDINGS:  Normal external genitalia, small anterior cervix with polypoid lesion emanating from external os Hysteroscopically: endocervical polyp base, scattered calcifications within the endometrium, bilateral tubal ostia visualized   ANESTHESIA: General, paracervial block INTRAVENOUS FLUIDS:  600 ml of LR ESTIMATED BLOOD LOSS:  15 ml URINE OUTPUT: 75 ml SPECIMENS: endocervical polyp, endometrial curettigns COMPLICATIONS:  None immediate.   FLUID DEFICIT: of normal saline  PROCEDURE: The patient was taken to the operating room and placed under general anesthesia. SCDs were in place.  Time out was performed. Patient was placed in dorsolithotomy in [place] stirrups. She was prepped and draped in the usual sterile fashion. A Red Rubber catheter was used to drain her bladder. A speculum was placeed in the vagina. The cervix was visualized anteriorly and grasped with a single-tooth tenaculum. Paracervical block was performed with 0.5% bupivicaine with 20 cc injected. Polyp was grasped with polyp forceps and removed. The hysteroscope was inserted and the endometrial cavity and inspected. The above findings were noted. The myosure reach was used to remove the polyp base from the endocervical canal and sample the endometrial cavity. The hysteroscope was removed. All instruments were  removed from the vagina. All instrument, needle and lap counts were correct x2. The patient was awakened and is recovering in stable condition.  Lorriane Shire, MD Minimally Invasive Gynecologic Surgery  Obstetrics and Gynecology, [facility] for [facility], [facility] 09/13/2023

## 2023-09-14 ENCOUNTER — Encounter (HOSPITAL_COMMUNITY): Payer: Self-pay | Admitting: Obstetrics and Gynecology

## 2023-09-14 DIAGNOSIS — N939 Abnormal uterine and vaginal bleeding, unspecified: Secondary | ICD-10-CM

## 2023-09-14 DIAGNOSIS — N840 Polyp of corpus uteri: Secondary | ICD-10-CM

## 2023-09-14 NOTE — Anesthesia Postprocedure Evaluation (Signed)
Anesthesia Post Note  Patient: Rhonda Baldwin  Procedure(s) Performed: DILATATION AND CURETTAGE /HYSTEROSCOPY AND POLYP RESECTION (Cervix)     Patient location during evaluation: PACU Anesthesia Type: General Level of consciousness: awake and alert Pain management: pain level controlled Vital Signs Assessment: post-procedure vital signs reviewed and stable Respiratory status: spontaneous breathing, nonlabored ventilation, respiratory function stable and patient connected to nasal cannula oxygen Cardiovascular status: blood pressure returned to baseline and stable Postop Assessment: no apparent nausea or vomiting Anesthetic complications: no   No notable events documented.          Mariann Barter

## 2023-09-15 ENCOUNTER — Encounter: Payer: Self-pay | Admitting: Obstetrics and Gynecology

## 2023-09-15 LAB — SURGICAL PATHOLOGY

## 2023-09-19 ENCOUNTER — Telehealth: Payer: Self-pay | Admitting: Pharmacist

## 2023-09-19 DIAGNOSIS — Z794 Long term (current) use of insulin: Secondary | ICD-10-CM

## 2023-09-19 DIAGNOSIS — E1165 Type 2 diabetes mellitus with hyperglycemia: Secondary | ICD-10-CM

## 2023-09-19 NOTE — Progress Notes (Addendum)
   09/19/2023  Patient ID: Rhonda Baldwin, female   DOB: [birth year], 43 y.o.   MRN: [id number]   Patient was called to follow up on blood sugars.  Unfortunately, she did not answer the phone. She is part of the [employer] for Diabetes.  Will follow up with the patient in 1 month.  Cassius DOROTHA Brought, PharmD, BCACP Clinical Pharmacist [phone]   ADDENDUM   Reason for call:   Patient called me back:  Follow up on medication management of diabetes.  HIPAA identifiers were obtained.  Patient is part of the [employer] for Diabetes.  Last A1c-10.9 (05/2023) She has a PCP appointment 12/08/23 with Dr. Kevin and a new HgA1c will be drawn at that time.  She reported her AM blood sugar as 135mg /dl and said they have been in that range for the last few weeks.  Current diabetes therapy: Moujaro 7.5mg  weekly Novolin 70/30 Kwikpen-60 units at bedtime Glimepiride 2 mg 1 tablet daily  Patient did not report any low blood sugars or any issues with hyper or hypoglycemic symptoms.   Since her reported blood sugars seem to be trending down, I will follow up with her after her appointment on 12/08/23.  Cassius DOROTHA Brought, PharmD, BCACP Clinical Pharmacist [phone]  ADDENDEUM:  Patient's 12/08/23 appointment is no longer on her schedule.  I no longer follow Dr. Roxane clinic.  Will close case and allow the assigned Pharmacist to follow the patient.   Cassius DOROTHA Brought, PharmD, BCACP Clinical Pharmacist [phone]

## 2023-09-19 NOTE — Addendum Note (Signed)
Addended by: Beecher Mcardle on: 09/19/2023 02:45 PM   Modules accepted: Orders

## 2023-10-05 ENCOUNTER — Telehealth: Payer: No Typology Code available for payment source | Admitting: Obstetrics and Gynecology

## 2023-10-05 ENCOUNTER — Telehealth (INDEPENDENT_AMBULATORY_CARE_PROVIDER_SITE_OTHER): Payer: No Typology Code available for payment source | Admitting: Obstetrics and Gynecology

## 2023-10-05 DIAGNOSIS — Z09 Encounter for follow-up examination after completed treatment for conditions other than malignant neoplasm: Secondary | ICD-10-CM

## 2023-10-05 DIAGNOSIS — N939 Abnormal uterine and vaginal bleeding, unspecified: Secondary | ICD-10-CM

## 2023-10-05 NOTE — Progress Notes (Signed)
 GYNECOLOGY VIRTUAL VISIT ENCOUNTER NOTE  Provider location: Center for [facility]   Patient location: Home  I connected with Rhonda Baldwin on 10/05/23 at 10:35 AM EST by MyChart Video Encounter and verified that I am speaking with the correct person using two identifiers.   I discussed the limitations, risks, security and privacy concerns of performing an evaluation and management service virtually and the availability of in person appointments. I also discussed with the patient that there may be a patient responsible charge related to this service. The patient expressed understanding and agreed to proceed.   History:  Rhonda Baldwin is a 43 y.o. G1P1 female being evaluated today for postop follow up   Having intermittent spotting; no pain otherwise doing well. Has continued megace since procedure. No issues with BM or voiding. Denies fever and chills. No abdominal pain or cramping.      Past Medical History:  Diagnosis Date   Diabetes mellitus    Hypertension    Thyroid disease    Past Surgical History:  Procedure Laterality Date   CESAREAN SECTION     CHOLECYSTECTOMY     HYSTEROSCOPY WITH D & C N/A 09/13/2023   Procedure: DILATATION AND CURETTAGE /HYSTEROSCOPY AND POLYP RESECTION;  Surgeon: Lorriane Shire, MD;  Location: [facility] OR;  Service: Gynecology;  Laterality: N/A;   The following portions of the patient's history were reviewed and updated as appropriate: allergies, current medications, past family history, past medical history, past social history, past surgical history and problem list.   Health Maintenance:      Component Value Date/Time   DIAGPAP  07/11/2023 1448    - Negative for intraepithelial lesion or malignancy (NILM)   DIAGPAP  06/25/2019 1614    - Negative for intraepithelial lesion or malignancy (NILM)   HPVHIGH Negative 07/11/2023 1448   HPVHIGH Negative 06/25/2019 1614   ADEQPAP  07/11/2023 1448    Satisfactory for  evaluation; transformation zone component ABSENT.   ADEQPAP  06/25/2019 1614    Satisfactory for evaluation; transformation zone component PRESENT.    High Risk HPV: Positive  Adequacy:  Satisfactory for evaluation, transformation zone component PRESENT  Diagnosis:  Atypical squamous cells of undetermined significance (ASC-US)   Review of Systems:  Pertinent items noted in HPI and remainder of comprehensive ROS otherwise negative.  Physical Exam:   General:  Alert, oriented and cooperative. Patient appears to be in no acute distress.  Mental Status: Normal mood and affect. Normal behavior. Normal judgment and thought content.   Respiratory: Normal respiratory effort, no problems with respiration noted  Rest of physical exam deferred due to type of encounter  Labs and Imaging FINAL MICROSCOPIC DIAGNOSIS:   A. ENDOCERVIX, POLYPECTOMY:  -  Benign endocervical polyp with erosion and prominent acute and  chronic inflammation, negative for dysplasia.   B. ENDOMETRIUM, CURETTAGE:  -  Benign endometrial polyp in the background of endometrium with  attenuated epithelium and decidualized stroma consistent with steroid  effect, negative for atypia/hyperplasia.    Assessment and Plan:     1. Abnormal uterine bleeding (AUB) (Primary) Will stop megace and will see how her menses. Noted to expect withdrawal bleed prior to resuming menses, anticipate should be less heavy now that polyps are gone  2. Postop check Doing well and meeting postop goals      I discussed the assessment and treatment plan with the patient. The patient was provided an opportunity to ask questions and all were  answered. The patient agreed with the plan and demonstrated an understanding of the instructions.   The patient was advised to call back or seek an in-person evaluation/go to the ED if the symptoms worsen or if the condition fails to improve as anticipated.  I provided 5 minutes of face-to-face time during  this encounter. I also spent 5 minutes dedicated to the care of this patient including pre-visit review of records, post visit ordering of medications and appropriate tests or procedures, coordinating care and documenting this visit encounter.    Lorriane Shire, MD Center for [employer], [facility]

## 2024-02-09 ENCOUNTER — Other Ambulatory Visit: Payer: Self-pay | Admitting: Family Medicine

## 2024-02-09 DIAGNOSIS — Z1231 Encounter for screening mammogram for malignant neoplasm of breast: Secondary | ICD-10-CM

## 2024-03-05 ENCOUNTER — Other Ambulatory Visit: Payer: Self-pay | Admitting: Obstetrics and Gynecology

## 2024-03-05 DIAGNOSIS — N939 Abnormal uterine and vaginal bleeding, unspecified: Secondary | ICD-10-CM

## 2024-03-06 ENCOUNTER — Other Ambulatory Visit: Payer: Self-pay | Admitting: Gastroenterology

## 2024-03-06 ENCOUNTER — Ambulatory Visit
Admission: RE | Admit: 2024-03-06 | Discharge: 2024-03-06 | Disposition: A | Discharge status: Home / Self Care | Source: Ambulatory Visit | Attending: Family Medicine | Admitting: Family Medicine

## 2024-03-06 DIAGNOSIS — Z1231 Encounter for screening mammogram for malignant neoplasm of breast: Secondary | ICD-10-CM

## 2024-03-14 NOTE — Progress Notes (Signed)
Attempted to obtain medical history. Unable to reach pt. At this time. HIPAA complaint voicemail left with pre-surgical testing number. 

## 2024-04-12 ENCOUNTER — Encounter (HOSPITAL_COMMUNITY): Payer: Self-pay | Admitting: Gastroenterology

## 2024-04-12 NOTE — Progress Notes (Signed)
 Attempted to obtain medical history for pre op call via telephone, unable to reach at this time. HIPAA compliant voicemail message left requesting return call to pre surgical testing department.

## 2024-04-19 ENCOUNTER — Ambulatory Visit (HOSPITAL_COMMUNITY)
Admission: RE | Admit: 2024-04-19 | Discharge: 2024-04-19 | Disposition: A | Discharge status: Home / Self Care | Attending: Gastroenterology | Admitting: Gastroenterology

## 2024-04-19 ENCOUNTER — Encounter (HOSPITAL_COMMUNITY)
Admission: RE | Disposition: A | Discharge status: Home / Self Care | Payer: Self-pay | Source: Home / Self Care | Attending: Gastroenterology

## 2024-04-19 ENCOUNTER — Ambulatory Visit (HOSPITAL_COMMUNITY): Payer: Self-pay

## 2024-04-19 ENCOUNTER — Encounter (HOSPITAL_COMMUNITY): Payer: Self-pay | Admitting: Gastroenterology

## 2024-04-19 ENCOUNTER — Other Ambulatory Visit: Payer: Self-pay

## 2024-04-19 ENCOUNTER — Ambulatory Visit (HOSPITAL_BASED_OUTPATIENT_CLINIC_OR_DEPARTMENT_OTHER): Payer: Self-pay

## 2024-04-19 DIAGNOSIS — I10 Essential (primary) hypertension: Secondary | ICD-10-CM

## 2024-04-19 DIAGNOSIS — Z79899 Other long term (current) drug therapy: Secondary | ICD-10-CM | POA: Insufficient documentation

## 2024-04-19 DIAGNOSIS — E6689 Other obesity not elsewhere classified: Secondary | ICD-10-CM | POA: Insufficient documentation

## 2024-04-19 DIAGNOSIS — E119 Type 2 diabetes mellitus without complications: Secondary | ICD-10-CM | POA: Diagnosis not present

## 2024-04-19 DIAGNOSIS — Z6841 Body Mass Index (BMI) 40.0 and over, adult: Secondary | ICD-10-CM | POA: Insufficient documentation

## 2024-04-19 DIAGNOSIS — Z7984 Long term (current) use of oral hypoglycemic drugs: Secondary | ICD-10-CM | POA: Insufficient documentation

## 2024-04-19 DIAGNOSIS — Z7985 Long-term (current) use of injectable non-insulin antidiabetic drugs: Secondary | ICD-10-CM | POA: Insufficient documentation

## 2024-04-19 DIAGNOSIS — Z1211 Encounter for screening for malignant neoplasm of colon: Secondary | ICD-10-CM | POA: Diagnosis present

## 2024-04-19 DIAGNOSIS — Z800 Family history of malignant neoplasm of digestive organs: Secondary | ICD-10-CM | POA: Diagnosis not present

## 2024-04-19 HISTORY — PX: COLONOSCOPY: SHX5424

## 2024-04-19 LAB — GLUCOSE, CAPILLARY: Glucose-Capillary: 128 mg/dL — ABNORMAL HIGH (ref 70–99)

## 2024-04-19 SURGERY — COLONOSCOPY
Anesthesia: Monitor Anesthesia Care

## 2024-04-19 MED ORDER — SODIUM CHLORIDE 0.9 % IV SOLN: INTRAVENOUS | Status: DC

## 2024-04-19 MED ORDER — ONDANSETRON HCL 4 MG/2ML IJ SOLN: 4.0000 mg | Freq: Once | INTRAMUSCULAR | Status: DC | PRN

## 2024-04-19 MED ORDER — SODIUM CHLORIDE 0.9 % IV SOLN
INTRAVENOUS | Status: DC | PRN
Start: 2024-04-19 — End: 2024-04-19

## 2024-04-19 MED ORDER — FENTANYL CITRATE (PF) 100 MCG/2ML IJ SOLN: 25.0000 ug | INTRAMUSCULAR | Status: DC | PRN

## 2024-04-19 MED ORDER — PROPOFOL 500 MG/50ML IV EMUL
INTRAVENOUS | Status: DC | PRN
  Administered 2024-04-19: 120 ug/kg/min via INTRAVENOUS
  Administered 2024-04-19 (×2): 50 mg via INTRAVENOUS

## 2024-04-19 MED ORDER — LIDOCAINE 2% (20 MG/ML) 5 ML SYRINGE
INTRAMUSCULAR | Status: DC | PRN
  Administered 2024-04-19: 60 mg via INTRAVENOUS

## 2024-04-19 NOTE — Op Note (Signed)
 [facility] Patient Name: Rhonda Baldwin Procedure Date: 04/19/2024 MRN: [id number] Attending MD: Belvie Just , MD, [id number] Date of Birth: April 29, 1981 CSN: [id number] Age: 43 Admit Type: Outpatient Procedure:                Colonoscopy Indications:              Screening for colorectal malignant neoplasm Providers:                Belvie Just, MD, Randall Lines, RN, Corene Southgate,                            Technician Referring MD:              Medicines:                Propofol  per Anesthesia Complications:            No immediate complications. Estimated Blood Loss:     Estimated blood loss: none. Procedure:                Pre-Anesthesia Assessment:                           - Prior to the procedure, a History and Physical                            was performed, and patient medications and                            allergies were reviewed. The patient's tolerance of                            previous anesthesia was also reviewed. The risks                            and benefits of the procedure and the sedation                            options and risks were discussed with the patient.                            All questions were answered, and informed consent                            was obtained. Prior Anticoagulants: The patient has                            taken no anticoagulant or antiplatelet agents. ASA                            Grade Assessment: III - A patient with severe                            systemic disease. After reviewing the risks and  benefits, the patient was deemed in satisfactory                            condition to undergo the procedure.                           - Sedation was administered by an anesthesia                            professional. Deep sedation was attained.                           After obtaining informed consent, the colonoscope                            was passed under direct  vision. Throughout the                            procedure, the patient's blood pressure, pulse, and                            oxygen saturations were monitored continuously. The                            CF-HQ190L ([id number]) Olympus colonoscope was                            introduced through the anus and advanced to the the                            cecum, identified by appendiceal orifice and                            ileocecal valve. The colonoscopy was performed                            without difficulty. The patient tolerated the                            procedure well. The quality of the bowel                            preparation was evaluated using the BBPS [facility]                            Bowel Preparation Scale) with scores of: Right                            Colon = 3, Transverse Colon = 3 and Left Colon = 3                            (entire mucosa seen well with no residual staining,  small fragments of stool or opaque liquid). The                            total BBPS score equals 9. The ileocecal valve,                            appendiceal orifice, and rectum were photographed. Scope In: 9:59:43 AM Scope Out: 10:11:13 AM Scope Withdrawal Time: 0 hours 8 minutes 48 seconds  Total Procedure Duration: 0 hours 11 minutes 30 seconds  Findings:      The entire examined colon appeared normal. Impression:               - The entire examined colon is normal.                           - No specimens collected. Moderate Sedation:      Not Applicable - Patient had care per Anesthesia. Recommendation:           - Patient has a contact number available for                            emergencies. The signs and symptoms of potential                            delayed complications were discussed with the                            patient. Return to normal activities tomorrow.                            Written discharge instructions were  provided to the                            patient.                           - Resume previous diet.                           - Continue present medications.                           - Repeat colonoscopy in 10 years for screening                            purposes. Procedure Code(s):        --- Professional ---                           [phone], Colonoscopy, flexible; diagnostic, including                            collection of specimen(s) by brushing or washing,                            when performed (separate procedure) Diagnosis Code(s):        ---  Professional ---                           Z12.11, Encounter for screening for malignant                            neoplasm of colon CPT copyright 2022 American Medical Association. All rights reserved. The codes documented in this report are preliminary and upon coder review may  be revised to meet current compliance requirements. Belvie Just, MD Belvie Just, MD 04/19/2024 10:22:46 AM This report has been signed electronically. Number of Addenda: 0

## 2024-04-19 NOTE — Anesthesia Postprocedure Evaluation (Signed)
 Anesthesia Post Note  Patient: Rhonda Baldwin  Procedure(s) Performed: COLONOSCOPY     Patient location during evaluation: PACU Anesthesia Type: MAC Level of consciousness: awake and alert and oriented Pain management: pain level controlled Vital Signs Assessment: post-procedure vital signs reviewed and stable Respiratory status: spontaneous breathing, nonlabored ventilation and respiratory function stable Cardiovascular status: stable and blood pressure returned to baseline Postop Assessment: no apparent nausea or vomiting Anesthetic complications: no   No notable events documented.  Last Vitals:  Vitals:   04/19/24 1020 04/19/24 1030  BP: 122/61 130/62  Pulse: 82 85  Resp: 16 18  Temp:    SpO2: 99% 100%    Last Pain:  Vitals:   04/19/24 1030  TempSrc:   PainSc: 0-No pain                 Nasha Diss A.

## 2024-04-19 NOTE — H&P (Signed)
 Michelina CINDERELLA Santee HPI: This 43 year old black female presents to the office for colorectal cancer screening. She has 1 BM every other day with no obvious blood or mucus in the stool. She has a good appetite and has lost 11 pounds over the last years on Mounjaro. She denies having any complaints of abdominal pain, nausea, vomiting, acid reflux, dysphagia or odynophagia. She denies having a family history of celiac sprue or IBD. Her paternal grandmother had colon cancer.  Past Medical History:  Diagnosis Date   Diabetes mellitus    Hypertension    Thyroid  disease     Past Surgical History:  Procedure Laterality Date   CESAREAN SECTION     CHOLECYSTECTOMY     HYSTEROSCOPY WITH D & C N/A 09/13/2023   Procedure: DILATATION AND CURETTAGE /HYSTEROSCOPY AND POLYP RESECTION;  Surgeon: Jeralyn Crutch, MD;  Location: [facility] OR;  Service: Gynecology;  Laterality: N/A;    Family History  Problem Relation Age of Onset   Hypertension Mother    Hypertension Father     Social History:  reports that she has never smoked. She has never used smokeless tobacco. She reports that she does not drink alcohol and does not use drugs.  Allergies:  Allergies  Allergen Reactions   Metformin  And Related Diarrhea   Orange Swelling    oranges    Medications: Scheduled: Continuous:  sodium chloride       No results found for this or any previous visit (from the past 24 hours).   No results found.  ROS:  As stated above in the HPI otherwise negative.  Blood pressure (!) 159/60, pulse 91, temperature 97.9 F (36.6 C), temperature source Temporal, resp. rate 11, height 5' 3 (1.6 m), weight (!) 154.2 kg, last menstrual period 03/24/2024, SpO2 98%.    PE: Gen: NAD, Alert and Oriented HEENT:  [place]/AT, EOMI Neck: Supple, no LAD Lungs: CTA Bilaterally CV: RRR without M/G/R ABD: Soft, NTND, +BS Ext: No C/C/E  Assessment/Plan: 1) Screening colonoscopy.  Preciliano Castell D 04/19/2024, 9:48 AM

## 2024-04-19 NOTE — Anesthesia Preprocedure Evaluation (Addendum)
 Anesthesia Evaluation  Patient identified by MRN, date of birth, ID band Patient awake    Reviewed: Allergy & Precautions, NPO status , Patient's Chart, lab work & pertinent test results, reviewed documented beta blocker date and time   Airway Mallampati: II  TM Distance: >3 FB     Dental no notable dental hx. (+) Teeth Intact, Dental Advisory Given   Pulmonary pneumonia, resolved   breath sounds clear to auscultation + decreased breath sounds      Cardiovascular hypertension, Pt. on medications and Pt. on home beta blockers Normal cardiovascular exam Rhythm:Regular Rate:Normal     Neuro/Psych negative neurological ROS  negative psych ROS   GI/Hepatic Neg liver ROS,,,Screening Colonoscopy   Endo/Other  diabetes, Well Controlled, Type 2, Oral Hypoglycemic Agents Hyperthyroidism Class 4 obesityGLP-1 RA therapy- last dose 8/24 HLD   Renal/GU negative Renal ROS  negative genitourinary   Musculoskeletal negative musculoskeletal ROS (+)    Abdominal   Peds  Hematology negative hematology ROS (+)   Anesthesia Other Findings   Reproductive/Obstetrics                              Anesthesia Physical Anesthesia Plan  ASA: 3  Anesthesia Plan: MAC   Post-op Pain Management: Minimal or no pain anticipated   Induction: Intravenous  PONV Risk Score and Plan: 3 and Ondansetron  and Propofol  infusion  Airway Management Planned: Nasal Cannula  Additional Equipment: None  Intra-op Plan:   Post-operative Plan: Extubation in OR  Informed Consent: I have reviewed the patients History and Physical, chart, labs and discussed the procedure including the risks, benefits and alternatives for the proposed anesthesia with the patient or authorized representative who has indicated his/her understanding and acceptance.     Dental advisory given  Plan Discussed with: Anesthesiologist and  CRNA  Anesthesia Plan Comments:          Anesthesia Quick Evaluation

## 2024-04-19 NOTE — Transfer of Care (Signed)
 Immediate Anesthesia Transfer of Care Note  Patient: Rhonda Baldwin  Procedure(s) Performed: COLONOSCOPY  Patient Location: PACU  Anesthesia Type:MAC  Level of Consciousness: awake  Airway & Oxygen Therapy: Patient Spontanous Breathing  Post-op Assessment: Report given to RN and Post -op Vital signs reviewed and stable  Post vital signs: Reviewed and stable  Last Vitals:  Vitals Value Taken Time  BP    Temp    Pulse 80 04/19/24 10:15  Resp 20 04/19/24 10:15  SpO2 98 % 04/19/24 10:15    Last Pain:  Vitals:   04/19/24 0912  TempSrc: Temporal  PainSc: 0-No pain         Complications: No notable events documented.

## 2024-04-19 NOTE — Discharge Instructions (Signed)

## 2024-04-21 ENCOUNTER — Encounter (HOSPITAL_COMMUNITY): Payer: Self-pay | Admitting: Gastroenterology

## 2024-06-05 ENCOUNTER — Encounter: Payer: Self-pay | Admitting: Podiatry

## 2024-06-05 ENCOUNTER — Ambulatory Visit: Admitting: Podiatry

## 2024-06-05 DIAGNOSIS — L600 Ingrowing nail: Secondary | ICD-10-CM | POA: Diagnosis not present

## 2024-06-06 NOTE — Progress Notes (Signed)
 Subjective:   Patient ID: Rhonda Baldwin, female   DOB: 43 y.o.   MRN: [id number]   HPI Patient presents stating that the right hallux toenail is coming off in the layers with only a thin layer of toenail left.  Patient states that it does not hurt wanted it checked because of diabetes and does have obesity.  Patient does not smoke tries to be active   Review of Systems  All other systems reviewed and are negative.       Objective:  Physical Exam Vitals and nursing note reviewed.  Constitutional:      Appearance: She is well-developed.  Pulmonary:     Effort: Pulmonary effort is normal.  Musculoskeletal:        General: Normal range of motion.  Skin:    General: Skin is warm.  Neurological:     Mental Status: She is alert.     Neurovascular status was found to be intact muscle strength adequate range of motion adequate diabetes related to obesity most likely with a right hallux nailbed that has fallen off but does have history of trauma with what appears to be attempts to regrow no erythema edema drainage     Assessment:  Traumatized right hallux nailbed that does appear to be healing with no indications of current pathology     Plan:  8 NP reviewed and discussed the utilization of continued trimming technique and the utilization of polish as needed.  I did explain it may not regrow normally ultimately may need to be removed permanently
